# Patient Record
Sex: Male | Born: 1955
Health system: Southern US, Community
[De-identification: ages and names within clinical notes are randomized; demographics above are authoritative.]

## PROBLEM LIST (undated history)

## (undated) DIAGNOSIS — I2510 Atherosclerotic heart disease of native coronary artery without angina pectoris: Secondary | ICD-10-CM

## (undated) DIAGNOSIS — J45909 Unspecified asthma, uncomplicated: Secondary | ICD-10-CM

## (undated) DIAGNOSIS — R011 Cardiac murmur, unspecified: Secondary | ICD-10-CM

## (undated) DIAGNOSIS — E785 Hyperlipidemia, unspecified: Secondary | ICD-10-CM

## (undated) DIAGNOSIS — E291 Testicular hypofunction: Secondary | ICD-10-CM

## (undated) DIAGNOSIS — J189 Pneumonia, unspecified organism: Secondary | ICD-10-CM

## (undated) DIAGNOSIS — C44311 Basal cell carcinoma of skin of nose: Secondary | ICD-10-CM

## (undated) DIAGNOSIS — I10 Essential (primary) hypertension: Secondary | ICD-10-CM

## (undated) DIAGNOSIS — B351 Tinea unguium: Secondary | ICD-10-CM

## (undated) HISTORY — DX: Hyperlipidemia, unspecified: E78.5

## (undated) HISTORY — DX: Testicular hypofunction: E29.1

## (undated) HISTORY — DX: Unspecified asthma, uncomplicated: J45.909

## (undated) HISTORY — DX: Tinea unguium: B35.1

## (undated) HISTORY — DX: Basal cell carcinoma of skin of nose: C44.311

## (undated) HISTORY — DX: Essential (primary) hypertension: I10

---
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
## 2012-08-24 ENCOUNTER — Ambulatory Visit (INDEPENDENT_AMBULATORY_CARE_PROVIDER_SITE_OTHER): Payer: BC Managed Care – PPO | Admitting: Sports Medicine

## 2012-08-24 ENCOUNTER — Encounter: Payer: Self-pay | Admitting: Sports Medicine

## 2012-08-24 VITALS — BP 151/89 | HR 65 | Wt 214.0 lb

## 2012-08-24 DIAGNOSIS — C44319 Basal cell carcinoma of skin of other parts of face: Secondary | ICD-10-CM

## 2012-08-24 DIAGNOSIS — E291 Testicular hypofunction: Secondary | ICD-10-CM

## 2012-08-24 DIAGNOSIS — I10 Essential (primary) hypertension: Secondary | ICD-10-CM

## 2012-08-24 DIAGNOSIS — B351 Tinea unguium: Secondary | ICD-10-CM

## 2012-08-24 DIAGNOSIS — R5381 Other malaise: Secondary | ICD-10-CM

## 2012-08-24 DIAGNOSIS — C44311 Basal cell carcinoma of skin of nose: Secondary | ICD-10-CM | POA: Insufficient documentation

## 2012-08-24 DIAGNOSIS — Z2989 Encounter for other specified prophylactic measures: Secondary | ICD-10-CM

## 2012-08-24 DIAGNOSIS — Z23 Encounter for immunization: Secondary | ICD-10-CM

## 2012-08-24 DIAGNOSIS — R5383 Other fatigue: Secondary | ICD-10-CM

## 2012-08-24 DIAGNOSIS — R0789 Other chest pain: Secondary | ICD-10-CM

## 2012-08-24 DIAGNOSIS — Z298 Encounter for other specified prophylactic measures: Secondary | ICD-10-CM

## 2012-08-24 HISTORY — DX: Essential (primary) hypertension: I10

## 2012-08-24 HISTORY — DX: Tinea unguium: B35.1

## 2012-08-24 HISTORY — DX: Testicular hypofunction: E29.1

## 2012-08-24 HISTORY — DX: Basal cell carcinoma of skin of nose: C44.311

## 2012-08-24 MED ORDER — NITROGLYCERIN 0.4 MG SL SUBL: 0.4000 mg | SUBLINGUAL_TABLET | SUBLINGUAL | Status: DC | PRN

## 2012-08-24 MED ORDER — ASPIRIN EC 81 MG PO TBEC: 81.0000 mg | DELAYED_RELEASE_TABLET | Freq: Every day | ORAL | Status: DC

## 2012-08-24 MED ORDER — TERBINAFINE HCL 250 MG PO TABS: 250.0000 mg | ORAL_TABLET | Freq: Every day | ORAL | Status: AC

## 2012-08-24 MED ORDER — HYDROCHLOROTHIAZIDE 25 MG PO TABS: 12.5000 mg | ORAL_TABLET | Freq: Every day | ORAL | Status: DC

## 2012-08-24 NOTE — Assessment & Plan Note (Addendum)
He is not currently having chest pain now, he does decline referral to cardiology for stress testing at this time. He would like to think about this for a while, and will let me know. I would like him to have some nitroglycerin on hand just in case in the meantime. Aspirin 81 mg. We will also optimize his risk factors. He will come back for fasting blood work.

## 2012-08-24 NOTE — Assessment & Plan Note (Signed)
Has been referred for Mohs microsurgery by his dermatologist.

## 2012-08-24 NOTE — Assessment & Plan Note (Signed)
Adding hydrochlorothiazide. RTC 2 weeks to recheck.

## 2012-08-24 NOTE — Progress Notes (Signed)
Subjective:    CC: Establish care.   HPI:  Elevated blood pressure: Present on several occasions, he's tried dieting, and low sodium, it is not improved.  Atypical chest pain: Present randomly, patient cannot relate it to exertion. It is substernal, and does not radiate. It is not associated with presyncope, nausea, or diaphoresis, and does not get relieved with rest.  Fatigue: Just a generalized sense, would like some blood work checked.  Toenail fungus: Has tried multiple over-the-counter brands for several months, wonders if there's a prescription strength.  Past medical history, Surgical history, Family history, Social history, Allergies, and medications have been entered into the medical record, reviewed, and no changes needed.   Review of Systems: No headache, visual changes, nausea, vomiting, diarrhea, constipation, dizziness, abdominal pain, skin rash, fevers, chills, night sweats, swollen lymph nodes, weight loss, chest pain, body aches, joint swelling, muscle aches, or shortness of breath.   Objective:    General: Well Developed, well nourished, and in no acute distress.  Neuro: Alert and oriented x3, extra-ocular muscles intact.  HEENT: Normocephalic, atraumatic, pupils equal round reactive to light, neck supple, no masses, no lymphadenopathy, thyroid nonpalpable.  Skin: Warm and dry, no rashes noted.  Scar from prior basal cell carcinoma excision noted on nose.  Cardiac: Regular rate and rhythm, no murmurs rubs or gallops.  Respiratory: Clear to auscultation bilaterally. Not using accessory muscles, speaking in full sentences.  Abdominal: Soft, nontender, nondistended, positive bowel sounds, no masses, no organomegaly.  Musculoskeletal: Shoulder, elbow, wrist, hip, knee, ankle stable, and with full range of motion.  Impression and Recommendations:

## 2012-08-24 NOTE — Assessment & Plan Note (Signed)
CBC, CMET, testosterone, lipids, TSH.

## 2012-08-24 NOTE — Patient Instructions (Signed)
Please think about seeing a cardiologist for a stress test.

## 2012-08-24 NOTE — Assessment & Plan Note (Signed)
Lamisil for 12 weeks. 

## 2012-08-28 LAB — COMPREHENSIVE METABOLIC PANEL
ALT: 14 U/L (ref 0–53)
AST: 16 U/L (ref 0–37)
Albumin: 4.1 g/dL (ref 3.5–5.2)
Alkaline Phosphatase: 66 U/L (ref 39–117)
BUN: 11 mg/dL (ref 6–23)
CO2: 27 mEq/L (ref 19–32)
Calcium: 9.4 mg/dL (ref 8.4–10.5)
Chloride: 101 mEq/L (ref 96–112)
Creat: 0.88 mg/dL (ref 0.50–1.35)
Glucose, Bld: 87 mg/dL (ref 70–99)
Potassium: 4 mEq/L (ref 3.5–5.3)
Sodium: 138 mEq/L (ref 135–145)
Total Bilirubin: 0.9 mg/dL (ref 0.3–1.2)
Total Protein: 7.2 g/dL (ref 6.0–8.3)

## 2012-08-28 LAB — LIPID PANEL
Cholesterol: 212 mg/dL — ABNORMAL HIGH (ref 0–200)
HDL: 37 mg/dL — ABNORMAL LOW (ref >39)
LDL Cholesterol: 144 mg/dL — ABNORMAL HIGH (ref 0–99)
Total CHOL/HDL Ratio: 5.7 Ratio
Triglycerides: 155 mg/dL — ABNORMAL HIGH (ref <150)
VLDL: 31 mg/dL (ref 0–40)

## 2012-08-28 LAB — CBC
HCT: 47.4 % (ref 39.0–52.0)
Hemoglobin: 16 g/dL (ref 13.0–17.0)
MCH: 29.3 pg (ref 26.0–34.0)
MCHC: 33.8 g/dL (ref 30.0–36.0)
MCV: 86.8 fL (ref 78.0–100.0)
Platelets: 255 10*3/uL (ref 150–400)
RBC: 5.46 MIL/uL (ref 4.22–5.81)
RDW: 13.6 % (ref 11.5–15.5)
WBC: 7.1 10*3/uL (ref 4.0–10.5)

## 2012-08-28 LAB — TSH: TSH: 1.267 u[IU]/mL (ref 0.350–4.500)

## 2012-08-29 ENCOUNTER — Encounter: Payer: Self-pay | Admitting: Sports Medicine

## 2012-08-29 LAB — TESTOSTERONE, FREE, TOTAL, SHBG
Sex Hormone Binding: 20 nmol/L (ref 13–71)
Testosterone, Free: 54.6 pg/mL (ref 47.0–244.0)
Testosterone-% Free: 2.5 % (ref 1.6–2.9)
Testosterone: 219.85 ng/dL — ABNORMAL LOW (ref 300–890)

## 2012-09-05 ENCOUNTER — Encounter: Payer: Self-pay | Admitting: Sports Medicine

## 2012-09-05 ENCOUNTER — Ambulatory Visit (INDEPENDENT_AMBULATORY_CARE_PROVIDER_SITE_OTHER): Payer: BC Managed Care – PPO | Admitting: Sports Medicine

## 2012-09-05 VITALS — BP 147/84 | HR 75 | Wt 212.0 lb

## 2012-09-05 DIAGNOSIS — R0789 Other chest pain: Secondary | ICD-10-CM

## 2012-09-05 DIAGNOSIS — R5383 Other fatigue: Secondary | ICD-10-CM

## 2012-09-05 DIAGNOSIS — E785 Hyperlipidemia, unspecified: Secondary | ICD-10-CM

## 2012-09-05 DIAGNOSIS — I10 Essential (primary) hypertension: Secondary | ICD-10-CM

## 2012-09-05 DIAGNOSIS — R5381 Other malaise: Secondary | ICD-10-CM

## 2012-09-05 DIAGNOSIS — Z299 Encounter for prophylactic measures, unspecified: Secondary | ICD-10-CM | POA: Insufficient documentation

## 2012-09-05 HISTORY — DX: Hyperlipidemia, unspecified: E78.5

## 2012-09-05 MED ORDER — TESTOSTERONE 40.5 MG/2.5GM (1.62%) TD GEL: 1.0000 "application " | Freq: Every day | TRANSDERMAL | Status: DC

## 2012-09-05 NOTE — Progress Notes (Signed)
Subjective:    CC: Followup  HPI: Fatigue: Testosterone levels came back low, he would like to start supplementation.  Hypertension: Improved on half of a tab. No adverse effects.  Atypical chest pain: He has decided that he would like to pursue stress testing, he's not had to use any nitroglycerin since the prior visit.  Preventive care: Due for a colonoscopy would like to set up.  Past medical history, Surgical history, Family history, Social history, Allergies, and medications have been entered into the medical record, reviewed, and no changes needed.   Review of Systems: No fevers, chills, night sweats, weight loss, chest pain, or shortness of breath.   Objective:    General: Well Developed, well nourished, and in no acute distress.  Neuro: Alert and oriented x3, extra-ocular muscles intact.  HEENT: Normocephalic, atraumatic, pupils equal round reactive to light, neck supple, no masses, no lymphadenopathy, thyroid nonpalpable.  Skin: Warm and dry, no rashes. Cardiac: Regular rate and rhythm, no murmurs rubs or gallops.  Respiratory: Clear to auscultation bilaterally. Not using accessory muscles, speaking in full sentences.  Impression and Recommendations:

## 2012-09-05 NOTE — Assessment & Plan Note (Signed)
The patient did have a chance to think about this, and would like to proceed to stress testing as recommended. I will go ahead and set him up with Dr. Jens Som downstairs.

## 2012-09-05 NOTE — Assessment & Plan Note (Signed)
Patient would like to try diet modification first. We will check his lipids again in 3 months.

## 2012-09-05 NOTE — Assessment & Plan Note (Signed)
Better but not ideal, increase the hydrochlorothiazide on entire tab daily. Followup in 2 weeks for nurse blood pressure check.

## 2012-09-05 NOTE — Assessment & Plan Note (Signed)
Testosterone level of 200, is the likely cause of his fatigue. We will start AndroGel. He will return to see me in 3 months for recheck. Goal is 500.

## 2012-09-05 NOTE — Assessment & Plan Note (Signed)
We'll go ahead and set him up with a gastroenterologist referral for colonoscopy.

## 2012-09-07 ENCOUNTER — Encounter: Payer: Self-pay | Admitting: Gastroenterology

## 2012-09-10 ENCOUNTER — Other Ambulatory Visit: Payer: Self-pay | Admitting: *Deleted

## 2012-09-19 ENCOUNTER — Ambulatory Visit (INDEPENDENT_AMBULATORY_CARE_PROVIDER_SITE_OTHER): Payer: BC Managed Care – PPO | Admitting: Sports Medicine

## 2012-09-19 VITALS — BP 138/82 | HR 70

## 2012-09-19 DIAGNOSIS — I10 Essential (primary) hypertension: Secondary | ICD-10-CM

## 2012-09-19 MED ORDER — LISINOPRIL-HYDROCHLOROTHIAZIDE 20-25 MG PO TABS: ORAL_TABLET | ORAL | Status: DC

## 2012-09-19 NOTE — Assessment & Plan Note (Signed)
I would like to add lisinopril and accommodation as his blood pressures close, not ideal I would like him to followup again in 2 weeks for repeat blood pressure check.

## 2012-09-19 NOTE — Progress Notes (Signed)
  Subjective:    Patient ID: Dominic Ray, male    DOB: 09-Dec-1955, 56 y.o.   MRN: [id number]  HPI    Pt denies chest pain, SOB, dizziness, or heart palpitations.  Taking meds as directed w/o problems.  Denies medication side effects.  5 min spent with pt.  Review of Systems     Objective:   Physical Exam        Assessment & Plan:

## 2012-09-25 ENCOUNTER — Encounter: Payer: Self-pay | Admitting: *Deleted

## 2012-09-25 ENCOUNTER — Encounter: Payer: Self-pay | Admitting: Cardiology

## 2012-09-26 ENCOUNTER — Encounter: Payer: Self-pay | Admitting: Cardiology

## 2012-09-26 ENCOUNTER — Ambulatory Visit (INDEPENDENT_AMBULATORY_CARE_PROVIDER_SITE_OTHER): Payer: BC Managed Care – PPO | Admitting: Cardiology

## 2012-09-26 VITALS — BP 131/89 | HR 68 | Wt 212.0 lb

## 2012-09-26 DIAGNOSIS — E785 Hyperlipidemia, unspecified: Secondary | ICD-10-CM

## 2012-09-26 DIAGNOSIS — I10 Essential (primary) hypertension: Secondary | ICD-10-CM

## 2012-09-26 DIAGNOSIS — R0789 Other chest pain: Secondary | ICD-10-CM

## 2012-09-26 NOTE — Progress Notes (Signed)
  HPI: 56 year old male for evaluation of chest pain. Seen recently by primary care for above complaint and we were asked to evaluate. Laboratories in October of 2013 showed normal hemoglobin, normal renal function and normal LFTs. TSH normal. LDL 144. Patient describes chest tightness intermittently for approximately 15 years. It is in the left chest area and lasts less than 1 minute. It is not pleuritic. He occasionally notices it after eating. It is not exertional. No radiation or associated symptoms. He denies dyspnea on exertion, orthopnea, PND, palpitations, syncope or exertional chest pain.  Current Outpatient Prescriptions  Medication Sig Dispense Refill  . aspirin EC 81 MG tablet Take 1 tablet (81 mg total) by mouth daily.  90 tablet  3  . lisinopril-hydrochlorothiazide (PRINZIDE,ZESTORETIC) 20-25 MG per tablet Take one half tab for a week, then one whole tablet daily.  30 tablet  3  . nitroGLYCERIN (NITROSTAT) 0.4 MG SL tablet Place 1 tablet (0.4 mg total) under the tongue every 5 (five) minutes as needed for chest pain.  10 tablet  12  . terbinafine (LAMISIL) 250 MG tablet Take 1 tablet (250 mg total) by mouth daily.  84 tablet  0  . Testosterone 40.5 MG/2.5GM (1.62%) GEL Apply 1 application topically daily. Apply to shoulders and upper arms.  75 g  0    Allergies  Allergen Reactions  . [name] (Lidocaine)     Past Medical History  Diagnosis Date  . Hypertension 08/24/2012  . Onychomycosis 08/24/2012  . Male hypogonadism 08/24/2012  . Basal cell carcinoma of nasal tip 08/24/2012  . Hyperlipidemia 09/05/2012  . Preventive measure 09/05/2012    Past Surgical History  Procedure Date  . Tonsillectomy     History   Social History  . Marital Status: Married    Spouse Name: N/A    Number of Children: N/A  . Years of Education: N/A   Occupational History  .      Unemployed   Social History Main Topics  . Smoking status: Never Smoker   . Smokeless tobacco: Not on file    . Alcohol Use: Yes     Comment: Occasional  . Drug Use: No  . Sexually Active: Not on file   Other Topics Concern  . Not on file   Social History Narrative  . No narrative on file    Family History  Problem Relation Age of Onset  . CAD Father     Died of MI at age 29    ROS: some hip pain but no fevers or chills, productive cough, hemoptysis, dysphasia, odynophagia, melena, hematochezia, dysuria, hematuria, rash, seizure activity, orthopnea, PND, pedal edema, claudication. Remaining systems are negative.  Physical Exam:   Blood pressure 131/89, pulse 68, weight 212 lb (96.163 kg).  General:  Well developed/well nourished in NAD Skin warm/dry Patient not depressed No peripheral clubbing Back-normal HEENT-normal/normal eyelids Neck supple/normal carotid upstroke bilaterally; no bruits; no JVD; no thyromegaly chest - CTA/ normal expansion CV - RRR/normal S1 and S2; no murmurs, rubs or gallops;  PMI nondisplaced Abdomen -NT/ND, no HSM, no mass, + bowel sounds, no bruit 2+ femoral pulses, no bruits Ext-no edema, chords, 2+ DP Neuro-grossly nonfocal  ECG NSR with no ST changes.

## 2012-09-26 NOTE — Assessment & Plan Note (Signed)
Symptoms are atypical. He does have a family history of coronary disease, hypertension and mild hyperlipidemia. Plan exercise treadmill for risk stratification.

## 2012-09-26 NOTE — Assessment & Plan Note (Signed)
Management per primary care. 

## 2012-09-26 NOTE — Patient Instructions (Addendum)
Your physician has requested that you have an exercise tolerance test. For further information please visit [email or web address]. Please also follow instruction sheet, as given.    Exercise Stress Electrocardiography An exercise stress test is a heart test (EKG) which is done while you are moving. You will walk on a treadmill. This test will tell your doctor how your heart does when it is forced to work harder and how much activity you can safely handle. BEFORE THE TEST  Wear shorts or athletic pants.  Wear comfortable tennis shoes.  Women need to wear a bra that allows patches to be put on under it. TEST  An EKG cable will be attached to your waist. This cable is hooked up to patches, which look like round stickers stuck to your chest.  You will be asked to walk on the treadmill.  You will walk until you are too tired or until you are told to stop.  Tell the doctor right away if you have:  Chest pain.  Leg cramps.  Shortness of breath.  Dizziness.  The test may last 30 minutes to 1 hour. The timing depends on your physical condition and the condition of your heart. AFTER THE TEST  You will rest for about 6 minutes. During this time, your heart rhythm and blood pressure will be checked.  The testing equipment will be removed from your body and you can get dressed.  You may go home or back to your hospital room. You may keep doing all your usual activities as told by your doctor. Finding out the results of your test Ask when your test results will be ready. Make sure you get your test results. Document Released: 04/18/2008 Document Revised: 01/23/2012 Document Reviewed: 04/18/2008 Exit[employer] Patient Information 2013 [employer].  

## 2012-09-26 NOTE — Assessment & Plan Note (Signed)
Blood pressure is reasonable on present medications. We'll continue. Patient instructed on last modification including low sodium diet, weight loss and exercise.

## 2012-10-04 ENCOUNTER — Ambulatory Visit (INDEPENDENT_AMBULATORY_CARE_PROVIDER_SITE_OTHER): Payer: BC Managed Care – PPO | Admitting: Sports Medicine

## 2012-10-04 VITALS — BP 115/73 | HR 81 | Wt 211.0 lb

## 2012-10-04 DIAGNOSIS — M25512 Pain in left shoulder: Secondary | ICD-10-CM

## 2012-10-04 DIAGNOSIS — M7550 Bursitis of unspecified shoulder: Secondary | ICD-10-CM | POA: Insufficient documentation

## 2012-10-04 DIAGNOSIS — R0789 Other chest pain: Secondary | ICD-10-CM

## 2012-10-04 DIAGNOSIS — I10 Essential (primary) hypertension: Secondary | ICD-10-CM

## 2012-10-04 DIAGNOSIS — M25519 Pain in unspecified shoulder: Secondary | ICD-10-CM

## 2012-10-04 NOTE — Progress Notes (Deleted)
  Subjective:    Patient ID: Dominic Ray, male    DOB: 11/20/1955, 56 y.o.   MRN: [id number]  HPI    Review of Systems     Objective:   Physical Exam        Assessment & Plan:  B/p CHECK TODAY  115/73  WT 211 P-81

## 2012-10-04 NOTE — Progress Notes (Signed)
Subjective:     Patient ID: Dominic Ray, male   DOB: 1956-08-07, 56 y.o.   MRN: [id number]  HPI Patient is a 56 yo male with a PMH of HTN who presents today for a blood pressure check after adding lisinopril to his medication regimen two weeks prior.   Patients blood pressure was 115/73 today and he has no complaints regarding his blood pressure. Denies dizziness, CP, syncope or new changes in urination.   Patient did complain of shoulder pain with abduction above the shoulder. This has been occuring for sometime and the patient believes it is secondary to how he likes to lay down while watching TV. He localizes the pain over the left trapezius, denies mechanical symptoms, the pain is mild.  Review of Systems  All other systems reviewed and are negative.      Objective:   Physical Exam  Constitutional: He is oriented to person, place, and time. He appears well-developed and well-nourished. No distress.  HENT:  Head: Normocephalic and atraumatic.  Eyes: Conjunctivae normal are normal.  Cardiovascular: Normal rate, regular rhythm and normal heart sounds.   Pulmonary/Chest: Effort normal and breath sounds normal.  Musculoskeletal: Normal range of motion. He exhibits no edema and no tenderness.       4/5 strength in left arm empty can test with slight pain.   Neurological: He is alert and oriented to person, place, and time. He has normal reflexes.  Skin: Skin is warm and dry. He is not diaphoretic.   left Shoulder: Inspection reveals no abnormalities, atrophy or asymmetry. Palpation is normal with no tenderness over AC joint or bicipital groove. ROM is full in all planes. Rotator cuff strength weak to abduction. Positive Hawkins, and Empty Can signs. Negative Neer test. Speeds and Yergason's tests normal. No labral pathology noted with negative Obrien's, negative clunk and good stability. Normal scapular function observed. No painful arc and no drop arm sign. No apprehension sign.    Assessment/Plan:

## 2012-10-04 NOTE — Assessment & Plan Note (Signed)
Stress test scheduled for later this month.

## 2012-10-04 NOTE — Assessment & Plan Note (Signed)
String the mild rotator cuff dysfunction. Occasional ibuprofen, and home rehabilitation exercises. We will revisit this in January.

## 2012-10-04 NOTE — Assessment & Plan Note (Signed)
Extremely well controlled on lisinopril/hydrochlorothiazide. No changes. 

## 2012-10-09 ENCOUNTER — Ambulatory Visit (INDEPENDENT_AMBULATORY_CARE_PROVIDER_SITE_OTHER): Payer: BC Managed Care – PPO | Admitting: Physician Assistant

## 2012-10-09 DIAGNOSIS — R0789 Other chest pain: Secondary | ICD-10-CM

## 2012-10-09 NOTE — Procedures (Signed)
Exercise Treadmill Test  Pre-Exercise Testing Evaluation Rhythm: normal sinus  Rate: 75   PR:  .15 QRS:  .09  QT:  .36 QTc: .40     Test  Exercise Tolerance Test Ordering MD: Olga Millers, MD  Interpreting MD: Lilian Coma  Unique Test No: 1  Treadmill:  1  Indication for ETT: CHEST TIGHTNESS  Contraindication to ETT: No   Stress Modality: exercise - treadmill  Cardiac Imaging Performed: non   Protocol: standard Bruce - maximal  Max BP:  160/67  Max MPHR (bpm):  164 85% MPR (bpm):  139  MPHR obtained (bpm):  164 % MPHR obtained:  100%  Reached 85% MPHR (min:sec):  8:15 Total Exercise Time (min-sec):  10:57  Workload in METS:  13.3 Borg Scale: 17  Reason ETT Terminated:  desired heart rate attained    ST Segment Analysis At Rest: normal ST segments - no evidence of significant ST depression With Exercise: no evidence of significant ST depression  Other Information Arrhythmia:  occasional PVCs in recovery Angina during ETT:  absent (0) Quality of ETT:  diagnostic  ETT Interpretation:  normal - no evidence of ischemia by ST analysis  Comments: Good exercise tolerance. No chest pain. Normal BP response to exercise. No ST-T changes to suggest ischemia.  Occasional PVCs in recovery.  Recommendations: Follow up with Dr. Olga Millers as directed. Luna Glasgow, PA-C  10:27 AM 10/09/2012

## 2012-10-10 ENCOUNTER — Ambulatory Visit (AMBULATORY_SURGERY_CENTER): Payer: BC Managed Care – PPO | Admitting: *Deleted

## 2012-10-10 ENCOUNTER — Encounter: Payer: Self-pay | Admitting: Gastroenterology

## 2012-10-10 VITALS — Ht 68.0 in | Wt 214.8 lb

## 2012-10-10 DIAGNOSIS — Z1211 Encounter for screening for malignant neoplasm of colon: Secondary | ICD-10-CM

## 2012-10-10 MED ORDER — MOVIPREP 100 G PO SOLR: ORAL | Status: DC

## 2012-10-15 ENCOUNTER — Ambulatory Visit (AMBULATORY_SURGERY_CENTER): Payer: BC Managed Care – PPO | Admitting: Gastroenterology

## 2012-10-15 ENCOUNTER — Encounter: Payer: Self-pay | Admitting: Gastroenterology

## 2012-10-15 VITALS — BP 96/70 | HR 73 | Temp 98.0°F | Resp 17 | Ht 68.0 in | Wt 214.0 lb

## 2012-10-15 DIAGNOSIS — Z1211 Encounter for screening for malignant neoplasm of colon: Secondary | ICD-10-CM

## 2012-10-15 MED ORDER — SODIUM CHLORIDE 0.9 % IV SOLN: 500.0000 mL | INTRAVENOUS | Status: DC

## 2012-10-15 NOTE — Op Note (Signed)
[facility] [address] [place], [address]   COLONOSCOPY PROCEDURE REPORT  PATIENT: Dominic Ray, Dominic Ray  MR#: [id number] BIRTHDATE: 1956-09-10 , 56  yrs. old GENDER: Male ENDOSCOPIST: Mardella Layman, MD, [facility] REFERRED BY: PROCEDURE DATE:  10/15/2012 PROCEDURE:   Colonoscopy, screening ASA CLASS:   Class II INDICATIONS:Average risk patient for colon cancer. MEDICATIONS: propofol (Diprivan) 250mg  IV  DESCRIPTION OF PROCEDURE:   After the risks and benefits and of the procedure were explained, informed consent was obtained.  A digital rectal exam revealed no abnormalities of the rectum.    The LB [id number]  endoscope was introduced through the anus and advanced to the cecum, which was identified by both the appendix and ileocecal valve .  The quality of the prep was adequate, using MoviPrep .  The instrument was then slowly withdrawn as the colon was fully examined.     COLON FINDINGS: A normal appearing cecum, ileocecal valve, and appendiceal orifice were identified.  The ascending, hepatic flexure, transverse, splenic flexure, descending, sigmoid colon and rectum appeared unremarkable.  No polyps or cancers were seen. Retroflexed views revealed no abnormalities.     The scope was then withdrawn from the patient and the procedure completed.  COMPLICATIONS: There were no complications. ENDOSCOPIC IMPRESSION: Normal colon, no polyps or cancer.  RECOMMENDATIONS: Continue current colorectal screening recommendations for "routine risk" patients with a repeat colonoscopy in 10 years.   REPEAT EXAM:  cc:Dr. Rodney Langton  _______________________________ eSigned:  Mardella Layman, MD, [facility] 10/15/2012 11:04 AM

## 2012-10-15 NOTE — Patient Instructions (Addendum)
Normal colonoscopy.    Repeat colonoscopy in 10 years.  YOU HAD AN ENDOSCOPIC PROCEDURE TODAY AT THE [facility]: Refer to the procedure report that was given to you for any specific questions about what was found during the examination.  If the procedure report does not answer your questions, please call your gastroenterologist to clarify.  If you requested that your care partner not be given the details of your procedure findings, then the procedure report has been included in a sealed envelope for you to review at your convenience later.  YOU SHOULD EXPECT: Some feelings of bloating in the abdomen. Passage of more gas than usual.  Walking can help get rid of the air that was put into your GI tract during the procedure and reduce the bloating. If you had a lower endoscopy (such as a colonoscopy or flexible sigmoidoscopy) you may notice spotting of blood in your stool or on the toilet paper. If you underwent a bowel prep for your procedure, then you may not have a normal bowel movement for a few days.  DIET: Your first meal following the procedure should be a light meal and then it is ok to progress to your normal diet.  A half-sandwich or bowl of soup is an example of a good first meal.  Heavy or fried foods are harder to digest and may make you feel nauseous or bloated.  Likewise meals heavy in dairy and vegetables can cause extra gas to form and this can also increase the bloating.  Drink plenty of fluids but you should avoid alcoholic beverages for 24 hours.  ACTIVITY: Your care partner should take you home directly after the procedure.  You should plan to take it easy, moving slowly for the rest of the day.  You can resume normal activity the day after the procedure however you should NOT DRIVE or use heavy machinery for 24 hours (because of the sedation medicines used during the test).    SYMPTOMS TO REPORT IMMEDIATELY: A gastroenterologist can be reached at any hour.  During normal  business hours, 8:30 AM to 5:00 PM Monday through Friday, call [phone].  After hours and on weekends, please call the GI answering service at [phone] who will take a message and have the physician on call contact you.   Following lower endoscopy (colonoscopy or flexible sigmoidoscopy):  Excessive amounts of blood in the stool  Significant tenderness or worsening of abdominal pains  Swelling of the abdomen that is new, acute  Fever of 100F or higher   FOLLOW UP: If any biopsies were taken you will be contacted by phone or by letter within the next 1-3 weeks.  Call your gastroenterologist if you have not heard about the biopsies in 3 weeks.  Our staff will call the home number listed on your records the next business day following your procedure to check on you and address any questions or concerns that you may have at that time regarding the information given to you following your procedure. This is a courtesy call and so if there is no answer at the home number and we have not heard from you through the emergency physician on call, we will assume that you have returned to your regular daily activities without incident.  SIGNATURES/CONFIDENTIALITY: You and/or your care partner have signed paperwork which will be entered into your electronic medical record.  These signatures attest to the fact that that the information above on your After Visit Summary has been   reviewed and is understood.  Full responsibility of the confidentiality of this discharge information lies with you and/or your care-partner. 

## 2012-10-16 ENCOUNTER — Telehealth: Payer: Self-pay | Admitting: *Deleted

## 2012-10-16 NOTE — Telephone Encounter (Signed)
  Follow up Call-  Call back number 10/15/2012  Post procedure Call Back phone  # [phone]  Permission to leave phone message Yes     Patient questions:  Do you have a fever, pain , or abdominal swelling? no Pain Score  0 *  Have you tolerated food without any problems? yes  Have you been able to return to your normal activities? yes  Do you have any questions about your discharge instructions: Diet   no Medications  no Follow up visit  no  Do you have questions or concerns about your Care? no  Actions: * If pain score is 4 or above: No action needed, pain <4.  Pt. Stated he had a little cough, do I think he may be getting a cold from this. Stated to pt. That the procedure would not give cold symptoms and if he started having a temperature he should follow up with pcp.

## 2012-10-17 ENCOUNTER — Other Ambulatory Visit: Payer: Self-pay | Admitting: Sports Medicine

## 2012-12-06 ENCOUNTER — Telehealth: Payer: Self-pay | Admitting: *Deleted

## 2012-12-06 ENCOUNTER — Encounter: Payer: Self-pay | Admitting: Sports Medicine

## 2012-12-06 ENCOUNTER — Ambulatory Visit: Payer: BC Managed Care – PPO | Admitting: Family Medicine

## 2012-12-06 ENCOUNTER — Ambulatory Visit (INDEPENDENT_AMBULATORY_CARE_PROVIDER_SITE_OTHER): Payer: BC Managed Care – PPO | Admitting: Sports Medicine

## 2012-12-06 VITALS — BP 115/80 | HR 84 | Wt 214.0 lb

## 2012-12-06 DIAGNOSIS — M25519 Pain in unspecified shoulder: Secondary | ICD-10-CM

## 2012-12-06 DIAGNOSIS — E785 Hyperlipidemia, unspecified: Secondary | ICD-10-CM

## 2012-12-06 DIAGNOSIS — C44311 Basal cell carcinoma of skin of nose: Secondary | ICD-10-CM

## 2012-12-06 DIAGNOSIS — M25512 Pain in left shoulder: Secondary | ICD-10-CM

## 2012-12-06 DIAGNOSIS — I10 Essential (primary) hypertension: Secondary | ICD-10-CM

## 2012-12-06 DIAGNOSIS — E291 Testicular hypofunction: Secondary | ICD-10-CM

## 2012-12-06 DIAGNOSIS — R0789 Other chest pain: Secondary | ICD-10-CM

## 2012-12-06 DIAGNOSIS — B351 Tinea unguium: Secondary | ICD-10-CM

## 2012-12-06 DIAGNOSIS — Z299 Encounter for prophylactic measures, unspecified: Secondary | ICD-10-CM

## 2012-12-06 LAB — COMPREHENSIVE METABOLIC PANEL
ALT: 15 U/L (ref 0–53)
AST: 18 U/L (ref 0–37)
Albumin: 4.3 g/dL (ref 3.5–5.2)
Alkaline Phosphatase: 62 U/L (ref 39–117)
BUN: 16 mg/dL (ref 6–23)
CO2: 29 mEq/L (ref 19–32)
Calcium: 9.1 mg/dL (ref 8.4–10.5)
Chloride: 98 mEq/L (ref 96–112)
Creat: 1.07 mg/dL (ref 0.50–1.35)
Glucose, Bld: 96 mg/dL (ref 70–99)
Potassium: 3.8 mEq/L (ref 3.5–5.3)
Sodium: 137 mEq/L (ref 135–145)
Total Bilirubin: 0.7 mg/dL (ref 0.3–1.2)
Total Protein: 7.3 g/dL (ref 6.0–8.3)

## 2012-12-06 LAB — LIPID PANEL
Cholesterol: 218 mg/dL — ABNORMAL HIGH (ref 0–200)
HDL: 40 mg/dL (ref >39)
LDL Cholesterol: 142 mg/dL — ABNORMAL HIGH (ref 0–99)
Total CHOL/HDL Ratio: 5.5 Ratio
Triglycerides: 179 mg/dL — ABNORMAL HIGH (ref <150)
VLDL: 36 mg/dL (ref 0–40)

## 2012-12-06 MED ORDER — LISINOPRIL-HYDROCHLOROTHIAZIDE 20-25 MG PO TABS: 1.0000 | ORAL_TABLET | Freq: Every day | ORAL | Status: DC

## 2012-12-06 MED ORDER — ASPIRIN EC 81 MG PO TBEC: 81.0000 mg | DELAYED_RELEASE_TABLET | Freq: Every day | ORAL | Status: DC

## 2012-12-06 NOTE — Assessment & Plan Note (Signed)
He has been working on dietary modification. We will recheck today.

## 2012-12-06 NOTE — Assessment & Plan Note (Signed)
UTD on colonoscopy October 14, 2012.

## 2012-12-06 NOTE — Telephone Encounter (Signed)
3 times a day if able, holla!

## 2012-12-06 NOTE — Progress Notes (Signed)
Subjective:    CC: Followup  HPI: Hypertension: Controlled, no problems with medication.  Hyperlipidemia: LDL was in the 140s, currently doing low-cholesterol diet.  Left shoulder pain: Rotator cuff dysfunction, has been inconsistent with exercise. Still with pain.  Hypogonadism: unfortunately is having a $400 per month for his AndroGel. He does note some depressed mood, and is unsure if this is because of stresses in life and difficulty finding a job versus low testosterone.  Atypical chest pain: Exercise treadmill test was negative. Has not had to use any nitroglycerin.  Past medical history, Surgical history, Family history not pertinant except as noted below, Social history, Allergies, and medications have been entered into the medical record, reviewed, and no changes needed.   Review of Systems: No fevers, chills, night sweats, weight loss, chest pain, or shortness of breath.   Objective:    General: Well Developed, well nourished, and in no acute distress.  Neuro: Alert and oriented x3, extra-ocular muscles intact, sensation grossly intact.  HEENT: Normocephalic, atraumatic, pupils equal round reactive to light, neck supple, no masses, no lymphadenopathy, thyroid nonpalpable.  Skin: Warm and dry, no rashes. Cardiac: Regular rate and rhythm, no murmurs rubs or gallops.  Respiratory: Clear to auscultation bilaterally. Not using accessory muscles, speaking in full sentences.  Impression and Recommendations:

## 2012-12-06 NOTE — Assessment & Plan Note (Signed)
Still hurting but inconsistent with rehab exercises. Will work harder on this and revisit in 6 weeks. Injection if no better.

## 2012-12-06 NOTE — Assessment & Plan Note (Signed)
Status post Mohs, has some skin irritation likely dermatitis related to sutures. Asked him to speak to his dermatologist regarding this.

## 2012-12-06 NOTE — Telephone Encounter (Signed)
Pt.notified

## 2012-12-06 NOTE — Assessment & Plan Note (Signed)
Inconsistent with testosterone. Rechecking. He is paying $400 a month for this and will call his insurance to see if there is a cheaper option.

## 2012-12-06 NOTE — Assessment & Plan Note (Signed)
Excellent control. Refilling medication.

## 2012-12-06 NOTE — Telephone Encounter (Signed)
Pt calls and was seen this morning and given shoulder exercises and wants to know how many times a day he needs to do these

## 2012-12-06 NOTE — Assessment & Plan Note (Signed)
ETT negative. May discontinue nitroglycerin.

## 2012-12-06 NOTE — Assessment & Plan Note (Signed)
Improving, s/p 3 months of terbinafine treatment.

## 2012-12-07 LAB — TESTOSTERONE, FREE, TOTAL, SHBG
Sex Hormone Binding: 22 nmol/L (ref 13–71)
Testosterone, Free: 59.5 pg/mL (ref 47.0–244.0)
Testosterone-% Free: 2.4 % (ref 1.6–2.9)
Testosterone: 248.03 ng/dL — ABNORMAL LOW (ref 300–890)

## 2013-01-17 ENCOUNTER — Ambulatory Visit: Payer: BC Managed Care – PPO | Admitting: Sports Medicine

## 2013-01-28 ENCOUNTER — Ambulatory Visit: Payer: BC Managed Care – PPO | Admitting: Sports Medicine

## 2013-02-07 ENCOUNTER — Ambulatory Visit: Payer: BC Managed Care – PPO | Admitting: Sports Medicine

## 2013-02-13 ENCOUNTER — Ambulatory Visit (INDEPENDENT_AMBULATORY_CARE_PROVIDER_SITE_OTHER): Payer: BC Managed Care – PPO | Admitting: Sports Medicine

## 2013-02-13 ENCOUNTER — Encounter: Payer: Self-pay | Admitting: Sports Medicine

## 2013-02-13 VITALS — BP 124/78 | HR 88 | Wt 216.0 lb

## 2013-02-13 DIAGNOSIS — M25512 Pain in left shoulder: Secondary | ICD-10-CM

## 2013-02-13 DIAGNOSIS — M25519 Pain in unspecified shoulder: Secondary | ICD-10-CM

## 2013-02-13 NOTE — Assessment & Plan Note (Signed)
Likely related to subacromial bursitis. Has improved only slightly with home exercises. Scan of the rotator cuff today and it was intact, he does have a thickened bursa suggestive of subacromial bursitis. He will think about injection. He'll let us know what he decides.

## 2013-02-13 NOTE — Progress Notes (Signed)
  Subjective:    CC: Right shoulder pain followup  HPI: Right shoulder: I diagnosed Dominic Ray with rotator cuff dysfunction at the last visit. He's been doing his rehabilitation exercises and notes only a minimal benefit. He still has pain he localizes over the deltoid, worse with flexion, internal rotation of the shoulder. Pain is moderate, doesn't radiate.  Past medical history, Surgical history, Family history not pertinant except as noted below, Social history, Allergies, and medications have been entered into the medical record, reviewed, and no changes needed.   Review of Systems: No fevers, chills, night sweats, weight loss, chest pain, or shortness of breath.   Objective:    General: Well Developed, well nourished, and in no acute distress.  Neuro: Alert and oriented x3, extra-ocular muscles intact, sensation grossly intact.  HEENT: Normocephalic, atraumatic, pupils equal round reactive to light, neck supple, no masses, no lymphadenopathy, thyroid nonpalpable.  Skin: Warm and dry, no rashes. Cardiac: Regular rate and rhythm, no murmurs rubs or gallops.  Respiratory: Clear to auscultation bilaterally. Not using accessory muscles, speaking in full sentences. Right Shoulder: Inspection reveals no abnormalities, atrophy or asymmetry. Palpation is normal with no tenderness over AC joint or bicipital groove. ROM is full in all planes. Rotator cuff strength weak abduction and external rotation Positive Neer and Hawkin's tests, empty can sign. Speeds and Yergason's tests normal. No labral pathology noted with negative Obrien's, negative clunk and good stability. Normal scapular function observed. No painful arc and no drop arm sign. No apprehension sign  Procedure: Limited ultrasound of right shoulder Device: GE logiq E. Findings: Supraspinatus, infraspinatus, biceps tendon, teres minor, subscapularis were all imaged and long and short axes, they were normal without signs of tearing. The  subacromial bursa did look enlarged. Impression: Subacromial subdeltoid bursitis.  Images permanently stored in the unit and are available for review. Impression and Recommendations:

## 2013-04-17 ENCOUNTER — Ambulatory Visit (INDEPENDENT_AMBULATORY_CARE_PROVIDER_SITE_OTHER): Payer: BC Managed Care – PPO | Admitting: Sports Medicine

## 2013-04-17 VITALS — BP 114/63 | HR 89

## 2013-04-17 DIAGNOSIS — M7550 Bursitis of unspecified shoulder: Secondary | ICD-10-CM

## 2013-04-17 DIAGNOSIS — IMO0002 Reserved for concepts with insufficient information to code with codable children: Secondary | ICD-10-CM

## 2013-04-17 DIAGNOSIS — M5136 Other intervertebral disc degeneration, lumbar region with discogenic back pain only: Secondary | ICD-10-CM | POA: Insufficient documentation

## 2013-04-17 DIAGNOSIS — M545 Low back pain, unspecified: Secondary | ICD-10-CM

## 2013-04-17 DIAGNOSIS — M75100 Unspecified rotator cuff tear or rupture of unspecified shoulder, not specified as traumatic: Secondary | ICD-10-CM

## 2013-04-17 NOTE — Assessment & Plan Note (Signed)
Dominic Ray's left shoulder pain resolved with home exercises and NSAIDs. Unfortunately pain has continued in the right side. Subacromial bursa injected on the right side under guidance today. Continue exercises.

## 2013-04-17 NOTE — Assessment & Plan Note (Signed)
Symptoms are classically discogenic. Going to have him do some home exercises, and possibly physical therapy further down the road. On further questioning, he does occasionally get right-sided radicular symptoms. See him back in a month or so for this.

## 2013-04-17 NOTE — Progress Notes (Signed)
  Subjective:    CC: Right shoulder  HPI: This pleasant 65 room male has bilateral shoulder pain, we put him through conservative measures including physical therapy, NSAIDs for his left and right shoulders, left shoulder pain has resolved. Previous ultrasound scans have showed intact rotator cuff with the subacromial subdeltoid bursitis. Currently he comes in with persistent pain in his right shoulder the localizes over the deltoid, worse with overhead activities, moderate, stable.  Low back pain: Worse with sitting, flexion, long car rides, localized in the right lower lumbar spine, occasionally radiates down the right leg. Worse with Valsalva. No trauma. Symptoms are moderate, persistent.  Past medical history, Surgical history, Family history not pertinant except as noted below, Social history, Allergies, and medications have been entered into the medical record, reviewed, and no changes needed.   Review of Systems: No fevers, chills, night sweats, weight loss, chest pain, or shortness of breath.   Objective:    General: Well Developed, well nourished, and in no acute distress.  Neuro: Alert and oriented x3, extra-ocular muscles intact, sensation grossly intact.  HEENT: Normocephalic, atraumatic, pupils equal round reactive to light, neck supple, no masses, no lymphadenopathy, thyroid nonpalpable.  Skin: Warm and dry, no rashes. Cardiac: Regular rate and rhythm, no murmurs rubs or gallops, no lower extremity edema.  Respiratory: Clear to auscultation bilaterally. Not using accessory muscles, speaking in full sentences. Right Shoulder: Inspection reveals no abnormalities, atrophy or asymmetry. Palpation is normal with no tenderness over AC joint or bicipital groove. ROM is full in all planes. Rotator cuff strength normal throughout. Positive Neer and Hawkin's tests, empty can sign. Speeds and Yergason's tests normal. No labral pathology noted with negative Obrien's, negative clunk and  good stability. Normal scapular function observed. No painful arc and no drop arm sign. No apprehension sign  Procedure: Real-time Ultrasound Guided Injection of right subacromial bursa Device: GE Logiq E  Ultrasound guided injection is preferred based studies that show increased duration, increased effect, greater accuracy, decreased procedural pain, increased response rate, and decreased cost with ultrasound guided versus blind injection.  Verbal informed consent obtained.  Time-out conducted.  Noted no overlying erythema, induration, or other signs of local infection.  Skin prepped in a sterile fashion.  Local anesthesia: Topical Ethyl chloride.  With sterile technique and under real time ultrasound guidance:  The 25-gauge needle 1 cc Kenalog 40, 4 cc lidocaine injected easily into the subacromial bursa. Completed without difficulty  Pain immediately resolved suggesting accurate placement of the medication.  Advised to call if fevers/chills, erythema, induration, drainage, or persistent bleeding.  Images permanently stored and available for review in the ultrasound unit.  Impression: Technically successful ultrasound guided injection. Impression and Recommendations:

## 2013-05-15 ENCOUNTER — Ambulatory Visit: Payer: BC Managed Care – PPO | Admitting: Sports Medicine

## 2013-05-23 ENCOUNTER — Ambulatory Visit (INDEPENDENT_AMBULATORY_CARE_PROVIDER_SITE_OTHER): Payer: BC Managed Care – PPO | Admitting: Sports Medicine

## 2013-05-23 ENCOUNTER — Encounter: Payer: Self-pay | Admitting: Sports Medicine

## 2013-05-23 VITALS — BP 103/65 | HR 72 | Wt 208.0 lb

## 2013-05-23 DIAGNOSIS — M75100 Unspecified rotator cuff tear or rupture of unspecified shoulder, not specified as traumatic: Secondary | ICD-10-CM

## 2013-05-23 DIAGNOSIS — M545 Low back pain, unspecified: Secondary | ICD-10-CM

## 2013-05-23 DIAGNOSIS — IMO0002 Reserved for concepts with insufficient information to code with codable children: Secondary | ICD-10-CM

## 2013-05-23 DIAGNOSIS — M5136 Other intervertebral disc degeneration, lumbar region with discogenic back pain only: Secondary | ICD-10-CM

## 2013-05-23 DIAGNOSIS — I10 Essential (primary) hypertension: Secondary | ICD-10-CM

## 2013-05-23 DIAGNOSIS — M7550 Bursitis of unspecified shoulder: Secondary | ICD-10-CM

## 2013-05-23 MED ORDER — LISINOPRIL-HYDROCHLOROTHIAZIDE 20-25 MG PO TABS: 1.0000 | ORAL_TABLET | Freq: Every day | ORAL | Status: DC

## 2013-05-23 NOTE — Progress Notes (Signed)
  Subjective:    CC: Followup  HPI: Rotator cuff dysfunction: The left side improved with conservative measures, I injected the subacromial bursa under guidance of the right side a month ago, currently almost pain-free. He is getting back into the gym, and wants to increase his intensity with overhead activities.  Low back pain: Improving with conservative measures, it is localized, doesn't radiate, mild.  Hypertension: Well controlled, needs refill of blood pressure medicines.  Past medical history, Surgical history, Family history not pertinant except as noted below, Social history, Allergies, and medications have been entered into the medical record, reviewed, and no changes needed.   Review of Systems: No fevers, chills, night sweats, weight loss, chest pain, or shortness of breath.   Objective:    General: Well Developed, well nourished, and in no acute distress.  Neuro: Alert and oriented x3, extra-ocular muscles intact, sensation grossly intact.  HEENT: Normocephalic, atraumatic, pupils equal round reactive to light, neck supple, no masses, no lymphadenopathy, thyroid nonpalpable.  Skin: Warm and dry, no rashes. Cardiac: Regular rate and rhythm, no murmurs rubs or gallops, no lower extremity edema.  Respiratory: Clear to auscultation bilaterally. Not using accessory muscles, speaking in full sentences. Impression and Recommendations:

## 2013-05-23 NOTE — Assessment & Plan Note (Signed)
Blood pressures very well controlled. Refilling lisinopril/hydrochlorothiazide.

## 2013-05-23 NOTE — Assessment & Plan Note (Signed)
Continues to be resolved at a right-sided subacromial injection. He will work hard on exercises on. I have asked him to avoid military press and shoulder press in the gym.

## 2013-05-23 NOTE — Assessment & Plan Note (Signed)
Continues to improve with home exercises. We will avoid aggressive intervention and imaging for now as long as he continues to improve.

## 2013-09-11 ENCOUNTER — Encounter: Payer: Self-pay | Admitting: Sports Medicine

## 2013-09-11 ENCOUNTER — Ambulatory Visit (INDEPENDENT_AMBULATORY_CARE_PROVIDER_SITE_OTHER): Payer: BC Managed Care – PPO

## 2013-09-11 ENCOUNTER — Ambulatory Visit (INDEPENDENT_AMBULATORY_CARE_PROVIDER_SITE_OTHER): Payer: BC Managed Care – PPO | Admitting: Sports Medicine

## 2013-09-11 VITALS — BP 121/71 | HR 57 | Wt 209.0 lb

## 2013-09-11 DIAGNOSIS — M5136 Other intervertebral disc degeneration, lumbar region with discogenic back pain only: Secondary | ICD-10-CM

## 2013-09-11 DIAGNOSIS — M5137 Other intervertebral disc degeneration, lumbosacral region: Secondary | ICD-10-CM

## 2013-09-11 DIAGNOSIS — M545 Low back pain, unspecified: Secondary | ICD-10-CM

## 2013-09-11 DIAGNOSIS — E291 Testicular hypofunction: Secondary | ICD-10-CM

## 2013-09-11 DIAGNOSIS — R9389 Abnormal findings on diagnostic imaging of other specified body structures: Secondary | ICD-10-CM

## 2013-09-11 MED ORDER — PREDNISONE 50 MG PO TABS: ORAL_TABLET | ORAL | Status: DC

## 2013-09-11 NOTE — Progress Notes (Signed)
  Subjective:    CC: Follow up  HPI: Right lumbar radiculitis: L5 distribution, improved significantly with home exercises, still desires to continue with conservative measures.  Male hypogonadism: AndroGel was too expensive, I did recommend that we try testosterone injections, he still would like to research this further. Primary symptom is fatigue, and difficulty gaining weight and muscle mass.  Past medical history, Surgical history, Family history not pertinant except as noted below, Social history, Allergies, and medications have been entered into the medical record, reviewed, and no changes needed.   Review of Systems: No fevers, chills, night sweats, weight loss, chest pain, or shortness of breath.   Objective:    General: Well Developed, well nourished, and in no acute distress.  Neuro: Alert and oriented x3, extra-ocular muscles intact, sensation grossly intact.  HEENT: Normocephalic, atraumatic, pupils equal round reactive to light, neck supple, no masses, no lymphadenopathy, thyroid nonpalpable.  Skin: Warm and dry, no rashes. Cardiac: Regular rate and rhythm, no murmurs rubs or gallops, no lower extremity edema.  Respiratory: Clear to auscultation bilaterally. Not using accessory muscles, speaking in full sentences.  I spent 25 minutes with this patient, greater than 50% was face-to-face time counseling regarding discogenic low back pain/radiculitis, and male hypogonadism.  Impression and Recommendations:

## 2013-09-11 NOTE — Assessment & Plan Note (Signed)
Right L5 radicular symptoms. Patient would like to continue conservative measures. I'm going to add prednisone, and x-rays. He can come back to see me on an as-needed basis, if he does desire to get more aggressive this would mean either formal physical therapy if he is agreeable versus MRI for interventional injection planning.

## 2013-09-11 NOTE — Assessment & Plan Note (Signed)
We had a long discussion about testosterone supplementation including the physiology associated with male hypogonadism. Topical testosterone was too expensive. We certainly can do injectable Depo-Testosterone. He is going to do some research on this which I think is appropriate, and he will let us know if he would like to start. Starting dose would be testosterone 200 mg intramuscular Q2 weeks.

## 2013-10-09 ENCOUNTER — Ambulatory Visit (INDEPENDENT_AMBULATORY_CARE_PROVIDER_SITE_OTHER): Payer: BC Managed Care – PPO | Admitting: Sports Medicine

## 2013-10-09 ENCOUNTER — Encounter: Payer: Self-pay | Admitting: Sports Medicine

## 2013-10-09 VITALS — BP 114/72 | HR 78 | Wt 207.0 lb

## 2013-10-09 DIAGNOSIS — IMO0002 Reserved for concepts with insufficient information to code with codable children: Secondary | ICD-10-CM

## 2013-10-09 DIAGNOSIS — M545 Low back pain, unspecified: Secondary | ICD-10-CM

## 2013-10-09 DIAGNOSIS — E291 Testicular hypofunction: Secondary | ICD-10-CM

## 2013-10-09 DIAGNOSIS — M7551 Bursitis of right shoulder: Secondary | ICD-10-CM

## 2013-10-09 DIAGNOSIS — M75100 Unspecified rotator cuff tear or rupture of unspecified shoulder, not specified as traumatic: Secondary | ICD-10-CM

## 2013-10-09 DIAGNOSIS — I10 Essential (primary) hypertension: Secondary | ICD-10-CM

## 2013-10-09 DIAGNOSIS — M5136 Other intervertebral disc degeneration, lumbar region with discogenic back pain only: Secondary | ICD-10-CM

## 2013-10-09 MED ORDER — PREDNISONE (PAK) 10 MG PO TABS: ORAL_TABLET | ORAL | Status: DC

## 2013-10-09 NOTE — Assessment & Plan Note (Signed)
Well controlled, no changes 

## 2013-10-09 NOTE — Progress Notes (Signed)
  Subjective:    CC: Followup  HPI: Right lumbar radiculitis: Valor has L4 on L5 spondylolisthesis with degenerative disc disease, he is actually doing very well with conservative measures including aquatic therapy, and home rehabilitation exercises. He responded very well to prednisone, and is wondering if we can try an additional course. Symptoms are mild, improving. He does not desire yet pursued advanced or interventional treatment.  Hypertension: Well controlled.  Hyperlipidemia: Stable.  Male hypogonadism:  AndroGel was too expensive, he does not get desire to proceed with parenteral testosterone supplementation.  Past medical history, Surgical history, Family history not pertinant except as noted below, Social history, Allergies, and medications have been entered into the medical record, reviewed, and no changes needed.   Review of Systems: No fevers, chills, night sweats, weight loss, chest pain, or shortness of breath.   Objective:    General: Well Developed, well nourished, and in no acute distress.  Neuro: Alert and oriented x3, extra-ocular muscles intact, sensation grossly intact.  HEENT: Normocephalic, atraumatic, pupils equal round reactive to light, neck supple, no masses, no lymphadenopathy, thyroid nonpalpable.  Skin: Warm and dry, no rashes. Cardiac: Regular rate and rhythm, no murmurs rubs or gallops, no lower extremity edema.  Respiratory: Clear to auscultation bilaterally. Not using accessory muscles, speaking in full sentences. Back Exam:  Inspection: Unremarkable  Motion: Flexion 45 deg, Extension 45 deg, Side Bending to 45 deg bilaterally,  Rotation to 45 deg bilaterally  SLR laying: Negative  XSLR laying: Negative  Palpable tenderness: None. FABER: negative. Sensory change: Gross sensation intact to all lumbar and sacral dermatomes.  Reflexes: 2+ at both patellar tendons, 2+ at achilles tendons, Babinski's downgoing.  Strength at foot  Plantar-flexion: 5/5  Dorsi-flexion: 5/5 Eversion: 5/5 Inversion: 5/5  Leg strength  Quad: 5/5 Hamstring: 5/5 Hip flexor: 5/5 Hip abductors: 5/5  Gait unremarkable.  Impression and Recommendations:

## 2013-10-09 NOTE — Assessment & Plan Note (Signed)
Still considering supplementation but wants to get his back figured out first.

## 2013-10-09 NOTE — Assessment & Plan Note (Signed)
Continues to be pain free months after injection.

## 2013-10-09 NOTE — Assessment & Plan Note (Signed)
Right-sided L5. X-rays do show L4-L5 degenerative disc disease of L4 on L5 spondylolisthesis. Doing very well with conservative measures, exercises, I'm going to add a single additional taper of prednisone. Followup in 3 months for this.

## 2014-01-09 ENCOUNTER — Ambulatory Visit: Payer: BC Managed Care – PPO | Admitting: Sports Medicine

## 2014-02-06 ENCOUNTER — Ambulatory Visit: Payer: BC Managed Care – PPO | Admitting: Sports Medicine

## 2014-02-13 ENCOUNTER — Ambulatory Visit (INDEPENDENT_AMBULATORY_CARE_PROVIDER_SITE_OTHER): Payer: BC Managed Care – PPO | Admitting: Sports Medicine

## 2014-02-13 ENCOUNTER — Encounter: Payer: Self-pay | Admitting: Sports Medicine

## 2014-02-13 VITALS — BP 117/67 | HR 67 | Ht 68.0 in | Wt 206.0 lb

## 2014-02-13 DIAGNOSIS — M75100 Unspecified rotator cuff tear or rupture of unspecified shoulder, not specified as traumatic: Secondary | ICD-10-CM

## 2014-02-13 DIAGNOSIS — M7550 Bursitis of unspecified shoulder: Secondary | ICD-10-CM

## 2014-02-13 DIAGNOSIS — M545 Low back pain, unspecified: Secondary | ICD-10-CM

## 2014-02-13 DIAGNOSIS — M5136 Other intervertebral disc degeneration, lumbar region with discogenic back pain only: Secondary | ICD-10-CM

## 2014-02-13 DIAGNOSIS — IMO0002 Reserved for concepts with insufficient information to code with codable children: Secondary | ICD-10-CM

## 2014-02-13 DIAGNOSIS — E291 Testicular hypofunction: Secondary | ICD-10-CM

## 2014-02-13 MED ORDER — TESTOSTERONE CYPIONATE 200 MG/ML IM SOLN
200.0000 mg | INTRAMUSCULAR | Status: DC
  Administered 2014-02-13: 200 mg via INTRAMUSCULAR

## 2014-02-13 NOTE — Assessment & Plan Note (Signed)
Continues to do extremely well after injection middle of last year.

## 2014-02-13 NOTE — Progress Notes (Signed)
  Subjective:    CC: Followup  HPI: Subacromial bursitis: Only a little bit of pain, but not yet ready for an injection.  Hypogonadism:  Amenable to starting testosterone injections.  Low back pain: Resolved with conservative measures.  Past medical history, Surgical history, Family history not pertinant except as noted below, Social history, Allergies, and medications have been entered into the medical record, reviewed, and no changes needed.   Review of Systems: No fevers, chills, night sweats, weight loss, chest pain, or shortness of breath.   Objective:    General: Well Developed, well nourished, and in no acute distress.  Neuro: Alert and oriented x3, extra-ocular muscles intact, sensation grossly intact.  HEENT: Normocephalic, atraumatic, pupils equal round reactive to light, neck supple, no masses, no lymphadenopathy, thyroid nonpalpable.  Skin: Warm and dry, no rashes. Cardiac: Regular rate and rhythm, no murmurs rubs or gallops, no lower extremity edema.  Respiratory: Clear to auscultation bilaterally. Not using accessory muscles, speaking in full sentences.  Impression and Recommendations:

## 2014-02-13 NOTE — Assessment & Plan Note (Signed)
Finally decided on testosterone supplementation. Topicals were too expensive, we are going to proceed with 200 mg of testosterone every 2 weeks intramuscular. He will do shot today, 2 weeks later, and then one week after that we will check his levels.

## 2014-02-13 NOTE — Assessment & Plan Note (Signed)
Resolved with conservative measures. 

## 2014-02-27 ENCOUNTER — Ambulatory Visit (INDEPENDENT_AMBULATORY_CARE_PROVIDER_SITE_OTHER): Payer: BC Managed Care – PPO | Admitting: Family Medicine

## 2014-02-27 VITALS — BP 117/73 | HR 78 | Ht 68.0 in | Wt 208.0 lb

## 2014-02-27 DIAGNOSIS — E291 Testicular hypofunction: Secondary | ICD-10-CM

## 2014-02-27 MED ORDER — TESTOSTERONE CYPIONATE 200 MG/ML IM SOLN
200.0000 mg | INTRAMUSCULAR | Status: DC
  Administered 2014-02-27: 200 mg via INTRAMUSCULAR

## 2014-02-27 NOTE — Progress Notes (Signed)
Patient was in office for Testosterone ingection 200 mg was given RUOQ. Patient denied any headaches, dizziness or shortness of breath or anything abnormal since he stated taking the Testosterone. Loyalty Brashier,CMA

## 2014-03-07 LAB — TESTOSTERONE: Testosterone: 771 ng/dL (ref 300–890)

## 2014-03-13 ENCOUNTER — Ambulatory Visit: Payer: BC Managed Care – PPO

## 2014-03-13 ENCOUNTER — Encounter: Payer: Self-pay | Admitting: *Deleted

## 2014-03-13 ENCOUNTER — Ambulatory Visit (INDEPENDENT_AMBULATORY_CARE_PROVIDER_SITE_OTHER): Payer: BC Managed Care – PPO | Admitting: Sports Medicine

## 2014-03-13 VITALS — BP 94/59 | HR 73 | Wt 207.0 lb

## 2014-03-13 DIAGNOSIS — E291 Testicular hypofunction: Secondary | ICD-10-CM

## 2014-03-13 MED ORDER — TESTOSTERONE CYPIONATE 200 MG/ML IM SOLN
200.0000 mg | Freq: Once | INTRAMUSCULAR | Status: AC
  Administered 2014-03-13: 200 mg via INTRAMUSCULAR

## 2014-03-13 NOTE — Assessment & Plan Note (Signed)
Testosterone injection as above. 

## 2014-03-13 NOTE — Progress Notes (Signed)
   Subjective:    Patient ID: Dominic Ray, male    DOB: 23-Apr-1956, 58 y.o.   MRN: [id number] Pt in for testosterone injection.  He states that he can't really tell a difference in the way he feels after starting the supplements. Injection given IM LUOQ. Beatris Ship, CMA HPI    Review of Systems     Objective:   Physical Exam        Assessment & Plan:

## 2014-03-27 ENCOUNTER — Ambulatory Visit (INDEPENDENT_AMBULATORY_CARE_PROVIDER_SITE_OTHER): Payer: BC Managed Care – PPO | Admitting: Sports Medicine

## 2014-03-27 VITALS — BP 126/79 | HR 71 | Wt 205.0 lb

## 2014-03-27 DIAGNOSIS — E291 Testicular hypofunction: Secondary | ICD-10-CM

## 2014-03-27 MED ORDER — TESTOSTERONE CYPIONATE 200 MG/ML IM SOLN
200.0000 mg | Freq: Once | INTRAMUSCULAR | Status: AC
  Administered 2014-03-27: 200 mg via INTRAMUSCULAR

## 2014-03-27 NOTE — Assessment & Plan Note (Signed)
Testosterone injection as above. 

## 2014-03-27 NOTE — Progress Notes (Signed)
   Subjective:    Patient ID: Dominic Ray, male    DOB: 06-Mar-1956, 58 y.o.   MRN: [id number]  HPI   Pt here for testosterone injection   Review of Systems     Objective:   Physical Exam        Assessment & Plan:  Given without complication

## 2014-04-10 ENCOUNTER — Encounter: Payer: Self-pay | Admitting: *Deleted

## 2014-04-10 ENCOUNTER — Ambulatory Visit (INDEPENDENT_AMBULATORY_CARE_PROVIDER_SITE_OTHER): Payer: BC Managed Care – PPO | Admitting: Sports Medicine

## 2014-04-10 VITALS — BP 111/72 | HR 74

## 2014-04-10 DIAGNOSIS — E291 Testicular hypofunction: Secondary | ICD-10-CM

## 2014-04-10 MED ORDER — TESTOSTERONE CYPIONATE 200 MG/ML IM SOLN: 200.0000 mg | Freq: Once | INTRAMUSCULAR | Status: DC

## 2014-04-10 NOTE — Assessment & Plan Note (Signed)
Testosterone injection as above. 

## 2014-04-10 NOTE — Progress Notes (Signed)
   Subjective:    Patient ID: Dominic Ray, male    DOB: 10/18/1956, 57 y.o.   MRN: [id number] Pt here for testosterone injection. 212ml given in the LUOQ with no complications. Answered some questions pt had. Oscar La, LPN  HPI    Review of Systems     Objective:   Physical Exam        Assessment & Plan:

## 2014-04-24 ENCOUNTER — Ambulatory Visit: Payer: BC Managed Care – PPO

## 2014-10-16 ENCOUNTER — Other Ambulatory Visit: Payer: Self-pay | Admitting: Sports Medicine

## 2016-01-11 ENCOUNTER — Other Ambulatory Visit: Payer: Self-pay | Admitting: Sports Medicine

## 2016-06-13 ENCOUNTER — Encounter: Payer: Self-pay | Admitting: Sports Medicine

## 2016-06-13 ENCOUNTER — Ambulatory Visit (INDEPENDENT_AMBULATORY_CARE_PROVIDER_SITE_OTHER): Payer: BLUE CROSS/BLUE SHIELD | Admitting: Sports Medicine

## 2016-06-13 DIAGNOSIS — R05 Cough: Secondary | ICD-10-CM

## 2016-06-13 DIAGNOSIS — R059 Cough, unspecified: Secondary | ICD-10-CM | POA: Insufficient documentation

## 2016-06-13 MED ORDER — HYDROCOD POLST-CPM POLST ER 10-8 MG/5ML PO SUER: 5.0000 mL | Freq: Two times a day (BID) | ORAL | 0 refills | Status: DC | PRN

## 2016-06-13 NOTE — Progress Notes (Signed)
  Subjective:    CC: cough   HPI: 60 yo M presenting with two weeks of cough.  He says that two weeks ago he developed a sore throat, runny nose, cough, headache, and myalgias which resolved within a couple of days with the exception of the cough.  He has had a productive cough for the past two weeks which is worse at night.  Over the past three days the cough has gotten better and now only occurs at night.  He says he coughs up purulent sputum.  He is no longer coughing during the day.  He takes Robitussen at night without much relief.  He denies any other symptoms - no fevers, fatigue, sore throat, runny nose, SOB.  He does not have a history of GERD and does not eat within two hours of going to bed.  He denies any history of CAD, orthopnea, lower extremity edema.  He is not a smoker.    Past medical history, Surgical history, Family history not pertinant except as noted below, Social history, Allergies, and medications have been entered into the medical record, reviewed, and no changes needed.   Review of Systems: No fevers, chills, night sweats, weight loss, chest pain, or shortness of breath. Positive for cough   Objective:    General: Well Developed, well nourished, and in no acute distress.  Neuro: Alert and oriented x3, extra-ocular muscles intact, sensation grossly intact.  HEENT: Normocephalic, atraumatic, pupils equal round reactive to light, neck supple, no masses, no lymphadenopathy, thyroid nonpalpable. Normal TMs.  No tonsillar erythema or exudates.  Skin: Warm and dry, no rashes. Cardiac: Regular rate and rhythm, no murmurs rubs or gallops, no lower extremity edema.  Respiratory: Clear to auscultation bilaterally. Not using accessory muscles, speaking in full sentences.   Impression and Recommendations:   1. Cough -Likely post-viral cough from URI  -Will prescribe Tussionex for cough  -Counseled that post-viral cough can last up to 4 weeks -If cough has not resolved by end of  the week, consider CXR to r/o pneumonia

## 2016-06-13 NOTE — Assessment & Plan Note (Signed)
Symptoms are classic for postinfectious cough after a viral infection. Cough can linger for 4 weeks, I'm going to add Tussionex at bedtime. If cough does not get better within the next several days we will get a chest x-ray, he will call me for this.

## 2016-12-19 DIAGNOSIS — M5136 Other intervertebral disc degeneration, lumbar region: Secondary | ICD-10-CM | POA: Diagnosis not present

## 2016-12-21 DIAGNOSIS — M1611 Unilateral primary osteoarthritis, right hip: Secondary | ICD-10-CM | POA: Diagnosis not present

## 2017-02-25 ENCOUNTER — Other Ambulatory Visit: Payer: Self-pay | Admitting: Sports Medicine

## 2017-03-15 ENCOUNTER — Telehealth: Payer: Self-pay

## 2017-03-15 NOTE — Telephone Encounter (Signed)
Pt called and wants a stress test.  He has had chest tightness several times recently.  I went over the warning signs of when to go to the ER.  Pt stated that the 2 times it has happened he had forgotten to take his lisinopril for a couple of days.  He has tried to replicate it several times with exertion, and has not been able to.  He has an appointment with you next Thursday.

## 2017-03-16 ENCOUNTER — Encounter: Payer: Self-pay | Admitting: *Deleted

## 2017-03-16 ENCOUNTER — Other Ambulatory Visit: Payer: Self-pay

## 2017-03-16 ENCOUNTER — Emergency Department (INDEPENDENT_AMBULATORY_CARE_PROVIDER_SITE_OTHER)
Admission: EM | Admit: 2017-03-16 | Discharge: 2017-03-16 | Disposition: A | Discharge status: Home / Self Care | Payer: BLUE CROSS/BLUE SHIELD | Source: Home / Self Care | Attending: Family Medicine | Admitting: Family Medicine

## 2017-03-16 ENCOUNTER — Emergency Department (INDEPENDENT_AMBULATORY_CARE_PROVIDER_SITE_OTHER): Payer: BLUE CROSS/BLUE SHIELD

## 2017-03-16 DIAGNOSIS — I700 Atherosclerosis of aorta: Secondary | ICD-10-CM | POA: Diagnosis not present

## 2017-03-16 DIAGNOSIS — R0789 Other chest pain: Secondary | ICD-10-CM

## 2017-03-16 DIAGNOSIS — R079 Chest pain, unspecified: Secondary | ICD-10-CM | POA: Diagnosis not present

## 2017-03-16 NOTE — ED Provider Notes (Signed)
[facility]    CSN: [id number] Arrival date & time: 03/16/17  1347     History   Chief Complaint Chief Complaint  Patient presents with  . Chest Pain    HPI Dominic Ray is a 61 y.o. male.   Three days ago while sitting in his car patient experienced brief (less than 10 seconds) non-radiating pressure-like sensation in his anterior chest that resolved spontaneously.  No shortness of breath.  Two days ago after playing softball, patient was sitting on bench and developed a second brief episode of similar chest discomfort.  He denies any chest symptoms while exercising, and notes that yesterday he did water aerobics for about 50 minutes without difficulty.  No recent injury to chest. Review of records reveals a normal EKG done 09/26/2012, and a negative exercise stress test done 09/26/2012. He has a family history of ASCVD:  2 uncles had coronary artery stents place.   His lipids have been mildly elevated in the past.  He states that he takes aspirin 81mg  daily.   The history is provided by the patient.  Chest Pain  Pain location:  Substernal area Pain quality: sharp   Pain radiates to:  Does not radiate Pain severity:  Mild Onset quality:  Sudden Timing:  Sporadic Progression:  Resolved Chronicity:  Recurrent Context: at rest   Context: not stress   Relieved by:  None tried Worsened by:  Nothing Ineffective treatments:  None tried Associated symptoms: no abdominal pain, no AICD problem, no claudication, no cough, no diaphoresis, no dizziness, no dysphagia, no fatigue, no fever, no heartburn, no lower extremity edema, no near-syncope, no numbness, no palpitations, no PND, no shortness of breath, no syncope and no weakness   Risk factors: male sex     Past Medical History:  Diagnosis Date  . Basal cell carcinoma of nasal tip 08/24/2012  . Hyperlipidemia 09/05/2012  . Hypertension 08/24/2012  . Male hypogonadism 08/24/2012  . Onychomycosis 08/24/2012  .  Preventive measure 09/05/2012    Patient Active Problem List   Diagnosis Date Noted  . Cough 06/13/2016  . Discogenic low back pain 04/17/2013  . Subacromial bursitis 10/04/2012  . Hyperlipidemia 09/05/2012  . Preventive measure 09/05/2012  . Hypertension 08/24/2012  . Atypical chest pain 08/24/2012  . Onychomycosis 08/24/2012  . Male hypogonadism 08/24/2012  . Basal cell carcinoma of nasal tip 08/24/2012    Past Surgical History:  Procedure Laterality Date  . TONSILLECTOMY  1968       Home Medications    Prior to Admission medications   Medication Sig Start Date End Date Taking? Authorizing Provider  aspirin EC 81 MG tablet Take 1 tablet (81 mg total) by mouth daily. 12/06/12  Yes Silverio Decamp, MD  lisinopril-hydrochlorothiazide (PRINZIDE,ZESTORETIC) 20-25 MG tablet TAKE 1 TABLET BY MOUTH EVERY DAY 02/25/17  Yes Silverio Decamp, MD  testosterone cypionate (DEPOTESTOTERONE CYPIONATE) 200 MG/ML injection Inject 1 mL (200 mg total) into the muscle every 14 (fourteen) days. 02/13/14   Silverio Decamp, MD    Family History Family History  Problem Relation Age of Onset  . CAD Father     Died of MI at age 63  . Cancer Mother   . Colon cancer Neg Hx   . Stomach cancer Neg Hx     Social History Social History  Substance Use Topics  . Smoking status: Never Smoker  . Smokeless tobacco: Never Used  . Alcohol use Yes     Comment: Occasional  Allergies   [name] [lidocaine]   Review of Systems Review of Systems  Constitutional: Negative for diaphoresis, fatigue and fever.  HENT: Negative for trouble swallowing.   Respiratory: Negative for cough and shortness of breath.   Cardiovascular: Positive for chest pain. Negative for palpitations, claudication, syncope, PND and near-syncope.  Gastrointestinal: Negative for abdominal pain and heartburn.  Neurological: Negative for dizziness, weakness and numbness.  All other systems reviewed and are  negative.    Physical Exam Triage Vital Signs ED Triage Vitals [03/16/17 1437]  Enc Vitals Group     BP 116/70     Pulse Rate 64     Resp 16     Temp 98 F (36.7 C)     Temp Source Oral     SpO2 98 %     Weight 221 lb (100.2 kg)     Height 5\' 8"  (1.727 m)     Head Circumference      Peak Flow      Pain Score 0     Pain Loc      Pain Edu?      Excl. in [place]?    No data found.   Updated Vital Signs BP 116/70 (BP Location: Left Arm)   Pulse 64   Temp 98 F (36.7 C) (Oral)   Resp 16   Ht 5\' 8"  (1.727 m)   Wt 221 lb (100.2 kg)   SpO2 98%   BMI 33.60 kg/m   Visual Acuity Right Eye Distance:   Left Eye Distance:   Bilateral Distance:    Right Eye Near:   Left Eye Near:    Bilateral Near:     Physical Exam Nursing notes and Vital Signs reviewed. Appearance:  Patient appears stated age, and in no acute distress Eyes:  Pupils are equal, round, and reactive to light and accomodation.  Extraocular movement is intact.  Conjunctivae are not inflamed  Ears:  Normal externally. Nose:  Normal Pharynx:  Normal Neck:  No adenopathy or thyromegaly. Lungs:  Clear to auscultation.  Breath sounds are equal.  Moving air well. Heart:  Regular rate and rhythm without murmurs, rubs, or gallops.  Chest:  No tenderness to palpation  Abdomen:  Nontender without masses or hepatosplenomegaly.  Bowel sounds are present.  No CVA or flank tenderness.  Extremities:  No edema.  Skin:  No rash present.    UC Treatments / Results  Labs (all labs ordered are listed, but only abnormal results are displayed) Labs Reviewed - No data to display  EKG  EKG Interpretation  : Rate:  67 BPM PR:  172 msec QT:  368 msec QTcH:  388 msec QRSD:  86 msec QRS axis:  4 degrees Interpretation:  Within normal limits (no significant change from EKG done 09/26/2012)       Radiology Dg Chest 2 View  Result Date: 03/16/2017 CLINICAL DATA:  Intermittent mid sternal chest pain for the past 4 days.  History of hypertension, nonsmoker. EXAM: CHEST  2 VIEW COMPARISON:  None in PACs FINDINGS: The lungs are adequately inflated. There is minimal linear density in the lingula. There is no alveolar infiltrate or pleural effusion. There is no pneumothorax or pneumomediastinum. The retrosternal soft tissues are normal. The heart and pulmonary vascularity are normal. There is calcification in the wall of the aortic arch. The trachea is midline. The bony thorax exhibits no acute abnormality. IMPRESSION: Subsegmental atelectasis or scarring in the lingula. No alveolar pneumonia nor CHF. Thoracic aortic atherosclerosis.  Electronically Signed   By: David  Martinique M.D.   On: 03/16/2017 15:32    Procedures Procedures (including critical care time)  Medications Ordered in UC Medications - No data to display   Initial Impression / Assessment and Plan / UC Course  I have reviewed the triage vital signs and the nursing notes.  Pertinent labs & imaging results that were available during my care of the patient were reviewed by me and considered in my medical decision making (see chart for details).    Today's EKG normal and unchanged from previous.  Chest X-ray shows no acute changes.  Patient's ability to perform strenuous athletic activity without symptoms is reassuring. Recommend that he follow-up with [name].    Final Clinical Impressions(s) / UC Diagnoses   Final diagnoses:  Chest wall pain    New Prescriptions New Prescriptions   No medications on file     Kandra Nicolas, MD 03/17/17 [phone]

## 2017-03-16 NOTE — Telephone Encounter (Signed)
Before ordering a stress test I need more details on the quality of the chest pain and baseline ecg.  Also he had a normal one 5 years ago.  Sometimes if the CP is typical in nature we proceed straight to cath.  He should see one of our providers asap.

## 2017-03-16 NOTE — Discharge Instructions (Signed)
If symptoms become significantly worse during the night or over the weekend, proceed to the local emergency room.  

## 2017-03-16 NOTE — ED Triage Notes (Addendum)
Pt c/o 2 episodes of center of chest tightness; the first episode was 3 days ago while sitting in his car and the second episode was 2 days ago while playing softball. Denies pain, SOB, N, or jaw pain. The episodes only last about 15 seconds.

## 2017-03-23 ENCOUNTER — Ambulatory Visit: Payer: BLUE CROSS/BLUE SHIELD | Admitting: Sports Medicine

## 2017-03-23 NOTE — Telephone Encounter (Signed)
Pt was seen in UC and made an appointment to see [name].

## 2017-03-31 NOTE — Progress Notes (Signed)
    Dominic Fendt, MD Reason for referral-Chest pain  HPI: 60 year old male for evaluation of chest pain at request of Theone Murdoch, MD. Patient seen in this office previously but not since November 2013. Exercise treadmill November 2013 normal. Patient seen recently with chest pain. Chest x-ray May 2018 showed thoracic aortic atherosclerosis and atelectasis. Several weeks ago the patient had an episode of chest pain. 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 He developed tightness in his chest for 15 seconds without radiation or associated symptoms. The pain was not pleuritic, positional. He had another episode after playing softball for 10 seconds. He otherwise denies dyspnea on exertion, orthopnea, PND, pedal edema, palpitations, syncope or exertional chest pain. Because of the above we were asked to evaluate.  Current Outpatient Prescriptions  Medication Sig Dispense Refill  . aspirin EC 81 MG tablet Take 1 tablet (81 mg total) by mouth daily. 90 tablet 3  . lisinopril-hydrochlorothiazide (PRINZIDE,ZESTORETIC) 20-25 MG tablet TAKE 1 TABLET BY MOUTH EVERY DAY 90 tablet 3   No current facility-administered medications for this visit.     Allergies  Allergen Reactions  . [name] [Lidocaine] Rash     Past Medical History:  Diagnosis Date  . Basal cell carcinoma of nasal tip 08/24/2012  . Hyperlipidemia 09/05/2012  . Hypertension 08/24/2012  . Male hypogonadism 08/24/2012  . Onychomycosis 08/24/2012  . Preventive measure 09/05/2012    Past Surgical History:  Procedure Laterality Date  . TONSILLECTOMY  1968    Social History   Social History  . Marital status: Married    Spouse name: N/A  . Number of children: N/A  . Years of education: N/A   Occupational History  .      Unemployed   Social History Main Topics  . Smoking status: Never Smoker  . Smokeless tobacco: Never Used  . Alcohol use Yes     Comment: Occasional  . Drug use: No  . Sexual  activity: Not on file   Other Topics Concern  . Not on file   Social History Narrative  . No narrative on file    Family History  Problem Relation Age of Onset  . CAD Father        Died of MI at age 27  . Cancer Mother   . Colon cancer Neg Hx   . Stomach cancer Neg Hx     ROS: no fevers or chills, productive cough, hemoptysis, dysphasia, odynophagia, melena, hematochezia, dysuria, hematuria, rash, seizure activity, orthopnea, PND, pedal edema, claudication. Remaining systems are negative.  Physical Exam:   Blood pressure 108/70, pulse 65, height 5\' 8"  (1.727 m), weight 98.2 kg (216 lb 6.4 oz).  General:  Well developed/well nourished in NAD Skin warm/dry Patient not depressed No peripheral clubbing Back-normal HEENT-normal/normal eyelids Neck supple/normal carotid upstroke bilaterally; no bruits; no JVD; no thyromegaly chest - CTA/ normal expansion CV - RRR/normal S1 and S2; no murmurs, rubs or gallops;  PMI nondisplaced Abdomen -NT/ND, no HSM, no mass, + bowel sounds, no bruit 2+ femoral pulses, no bruits Ext-no edema, chords, 2+ DP Neuro-grossly nonfocal  ECG -  03/16/2017-sinus rhythm with no ST changes. personally reviewed  A/P  1 chest pain-symptoms are atypical. He is extremely concerned. We will arrange an exercise treadmill for risk stratification. Check echocardiogram for LV function and wall motion.  2 hypertension-blood pressure is controlled. Continue present medications.  3 hyperlipidemia-management per primary care.  Kirk Ruths, MD

## 2017-04-05 ENCOUNTER — Ambulatory Visit (INDEPENDENT_AMBULATORY_CARE_PROVIDER_SITE_OTHER): Payer: BLUE CROSS/BLUE SHIELD | Admitting: Cardiology

## 2017-04-05 ENCOUNTER — Encounter: Payer: Self-pay | Admitting: Cardiology

## 2017-04-05 VITALS — BP 108/70 | HR 65 | Ht 68.0 in | Wt 216.4 lb

## 2017-04-05 DIAGNOSIS — I10 Essential (primary) hypertension: Secondary | ICD-10-CM | POA: Diagnosis not present

## 2017-04-05 DIAGNOSIS — E7800 Pure hypercholesterolemia, unspecified: Secondary | ICD-10-CM | POA: Diagnosis not present

## 2017-04-05 DIAGNOSIS — R072 Precordial pain: Secondary | ICD-10-CM

## 2017-04-05 NOTE — Patient Instructions (Signed)
Medication Instructions:   NO CHANGE  Testing/Procedures:  Your physician has requested that you have an echocardiogram. Echocardiography is a painless test that uses sound waves to create images of your heart. It provides your doctor with information about the size and shape of your heart and how well your heart's chambers and valves are working. This procedure takes approximately one hour. There are no restrictions for this procedure.   Your physician has requested that you have an exercise tolerance test. For further information please visit [email or web address]. Please also follow instruction sheet, as given.    Follow-Up:  Your physician recommends that you schedule a follow-up appointment in: AS NEEDED    Exercise Stress Electrocardiogram An exercise stress electrocardiogram is a test to check how blood flows to your heart. It is done to find areas of poor blood flow. You will need to walk on a treadmill for this test. The electrocardiogram will record your heartbeat when you are at rest and when you are exercising. What happens before the procedure?  Do not have drinks with caffeine or foods with caffeine for 24 hours before the test, or as told by your doctor. This includes coffee, tea (even decaf tea), sodas, chocolate, and cocoa.  Follow your doctor's instructions about eating and drinking before the test.  Ask your doctor what medicines you should or should not take before the test. Take your medicines with water unless told by your doctor not to.  If you use an inhaler, bring it with you to the test.  Bring a snack to eat after the test.  Do not  smoke for 4 hours before the test.  Do not put lotions, powders, creams, or oils on your chest before the test.  Wear comfortable shoes and clothing. What happens during the procedure?  You will have patches put on your chest. Small areas of your chest may need to be shaved. Wires will be connected to the patches.  Your  heart rate will be watched while you are resting and while you are exercising.  You will walk on the treadmill. The treadmill will slowly get faster to raise your heart rate.  The test will take about 1-2 hours. What happens after the procedure?  Your heart rate and blood pressure will be watched after the test.  You may return to your normal diet, activities, and medicines or as told by your doctor. This information is not intended to replace advice given to you by your health care provider. Make sure you discuss any questions you have with your health care provider. Document Released: 04/18/2008 Document Revised: 06/29/2016 Document Reviewed: 07/08/2013 Elsevier Interactive Patient Education  2017 [employer].

## 2017-04-12 ENCOUNTER — Ambulatory Visit (HOSPITAL_BASED_OUTPATIENT_CLINIC_OR_DEPARTMENT_OTHER)
Admission: RE | Admit: 2017-04-12 | Discharge: 2017-04-12 | Disposition: A | Discharge status: Home / Self Care | Payer: BLUE CROSS/BLUE SHIELD | Source: Ambulatory Visit | Attending: Cardiology | Admitting: Cardiology

## 2017-04-12 DIAGNOSIS — E785 Hyperlipidemia, unspecified: Secondary | ICD-10-CM | POA: Diagnosis not present

## 2017-04-12 DIAGNOSIS — R072 Precordial pain: Secondary | ICD-10-CM | POA: Diagnosis not present

## 2017-04-12 DIAGNOSIS — I10 Essential (primary) hypertension: Secondary | ICD-10-CM | POA: Insufficient documentation

## 2017-04-12 DIAGNOSIS — I071 Rheumatic tricuspid insufficiency: Secondary | ICD-10-CM | POA: Diagnosis not present

## 2017-04-12 NOTE — Progress Notes (Signed)
  Echocardiogram 2D Echocardiogram has been performed.  Dominic Ray 04/12/2017, 1:20 PM

## 2017-04-12 NOTE — Progress Notes (Deleted)
  Echocardiogram 2D Echocardiogram has been performed.  Donata Clay 04/12/2017, 11:26 AM

## 2017-04-14 HISTORY — PX: CARDIOVASCULAR STRESS TEST: SHX262

## 2017-04-20 ENCOUNTER — Telehealth (HOSPITAL_COMMUNITY): Payer: Self-pay

## 2017-04-20 NOTE — Telephone Encounter (Signed)
Encounter complete. 

## 2017-04-25 ENCOUNTER — Encounter (HOSPITAL_COMMUNITY): Payer: Self-pay | Admitting: *Deleted

## 2017-04-25 ENCOUNTER — Ambulatory Visit (HOSPITAL_COMMUNITY)
Admission: RE | Admit: 2017-04-25 | Discharge: 2017-04-25 | Disposition: A | Discharge status: Home / Self Care | Payer: BLUE CROSS/BLUE SHIELD | Source: Ambulatory Visit | Attending: Cardiovascular Disease | Admitting: Cardiovascular Disease

## 2017-04-25 DIAGNOSIS — R072 Precordial pain: Secondary | ICD-10-CM | POA: Insufficient documentation

## 2017-04-25 LAB — EXERCISE TOLERANCE TEST
Estimated workload: 0 METS
Exercise duration (min): 10 min
Exercise duration (sec): 10 s
MPHR: 160 {beats}/min
Peak HR: 164 {beats}/min
Percent HR: 102 %
RPE: 17
Rest HR: 63 {beats}/min

## 2017-04-25 NOTE — Progress Notes (Signed)
Dr. Oval Linsey reviewed ETT and since patient did not have chest pain it is alright to leave.

## 2017-04-30 NOTE — Progress Notes (Addendum)
Cardiology Office Note    Date:  05/01/2017  ID:  Dominic Ray, DOB [birth year], MRN [id number] PCP:  Silverio Decamp, MD  Cardiologist:  [name]   Chief Complaint: discuss abormal stress test  History of Present Illness:  Dominic Ray is a 61 y.o. male with history of HTN, HLD, family history of CAD who recently saw 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 2D Echo 04/12/17 showed mild LVH, EF 60-65%, no RWMA, normal diastolic parameters, mild LAE. ETT 04/25/17 was abnormal with 2 mm ST depression in inferior leads and lead 1 beginning at peak exercise. He was referred back to clinic to discuss cath. No recent labs on file for review - last LDL 142 and Cr 1.07 in 2014.  [name] recommended he return to clinic today to arrange cardiac cath. He returns for followup with his wife today. No recurrent chest pain. No dyspnea. No syncope. Denies prior h/o TIA, stroke or bleeding. Has not had cholesterol checked in a while. Has lots of questions about procedure and possible outcomes.   Past Medical History:  Diagnosis Date  . Basal cell carcinoma of nasal tip 08/24/2012  . Hyperlipidemia 09/05/2012  . Hypertension 08/24/2012  . Male hypogonadism 08/24/2012  . Onychomycosis 08/24/2012  . Preventive measure 09/05/2012    Past Surgical History:  Procedure Laterality Date  . TONSILLECTOMY  1968    Current Medications: Current Outpatient Prescriptions  Medication Sig Dispense Refill  . aspirin EC 81 MG tablet Take 1 tablet (81 mg total) by mouth daily. 90 tablet 3  . lisinopril-hydrochlorothiazide (PRINZIDE,ZESTORETIC) 20-25 MG tablet TAKE 1 TABLET BY MOUTH EVERY DAY 90 tablet 3   No current facility-administered medications for this visit.      Allergies:   [name] [lidocaine]   Social History   Social History  . Marital status: Married    Spouse name: N/A  . Number of children: N/A    . Years of education: N/A   Occupational History  .      Unemployed   Social History Main Topics  . Smoking status: Never Smoker  . Smokeless tobacco: Never Used  . Alcohol use Yes     Comment: Occasional  . Drug use: No  . Sexual activity: Not Asked   Other Topics Concern  . None   Social History Narrative  . None     Family History:  Family History  Problem Relation Age of Onset  . CAD Father        Died of MI at age 76  . Cancer Mother   . Colon cancer Neg Hx   . Stomach cancer Neg Hx     ROS:   Please see the history of present illness. All other systems are reviewed and otherwise negative.    PHYSICAL EXAM:   VS:  BP 112/70 (BP Location: Left Arm)   Pulse 65   Ht 5\' 9"  (1.753 m)   Wt 220 lb 9.6 oz (100.1 kg)   BMI 32.58 kg/m   BMI: Body mass index is 32.58 kg/m. GEN: Well nourished, well developed WM, in no acute distress  HEENT: normocephalic, atraumatic Neck: no JVD, carotid bruits, or masses Cardiac: RRR; no murmurs, rubs, or gallops, no edema  Respiratory:  clear to auscultation bilaterally, normal work of breathing GI: soft, nontender, nondistended, + BS MS: no deformity or atrophy  Skin: warm and dry, no  rash Neuro:  Alert and Oriented x 3, Strength and sensation are intact, follows commands Psych: euthymic mood, full affect  Wt Readings from Last 3 Encounters:  05/01/17 220 lb 9.6 oz (100.1 kg)  04/05/17 216 lb 6.4 oz (98.2 kg)  03/16/17 221 lb (100.2 kg)      Studies/Labs Reviewed:   EKG:  EKG was ordered today and personally reviewed by me and demonstrates NSR 65bpm, TWI III, no acute ST-T changes.  Recent Labs: No results found for requested labs within last 8760 hours.   Lipid Panel    Component Value Date/Time   CHOL 218 (H) 12/06/2012 0857   TRIG 179 (H) 12/06/2012 0857   HDL 40 12/06/2012 0857   CHOLHDL 5.5 12/06/2012 0857   VLDL 36 12/06/2012 0857   LDLCALC 142 (H) 12/06/2012 0857    Additional studies/ records that  were reviewed today include: Summarized above.    ASSESSMENT & PLAN:   1. Abnormal stress test/recent angina pectoris - Cath recommended by [name]. Discussed in detail with patient today. Long discussion about potential outcomes. Risks and benefits of cardiac catheterization have been discussed with the patient. These include bleeding, infection, kidney damage, stroke, heart attack, death. The patient understands these risks and is willing to proceed. He wishes to schedule this early next week. Discussed risk of delaying procedure; he verbalized understanding. Continue ASA. Add SL NTG PRN. Discussed use. Warning sx/ER precautions reviewed. Check pre-cath labs. Addendum: reviewed above with [name] with regard to whether or not to add antianginal prior to cath; he wishes to proceed with cath before addition of any new agents. 2. Essential HTN - controlled. 3. Hyperlipidemia - check CMET/lipids today. Plan to add statin if appropriate.  Disposition: F/u with Dr. Melynda Ripple team APP in 3 weeks.  Medication Adjustments/Labs and Tests Ordered: Current medicines are reviewed at length with the patient today.  Concerns regarding medicines are outlined above. Medication changes, Labs and Tests ordered today are summarized above and listed in the Patient Instructions accessible in Encounters.   Signed, Charlie Pitter, PA-C  05/01/2017 11:36 AM    [facility] [place], [place], [place]  [address] Phone: [phone]; Fax: [phone]

## 2017-05-01 ENCOUNTER — Other Ambulatory Visit: Payer: Self-pay | Admitting: Physician Assistant

## 2017-05-01 ENCOUNTER — Ambulatory Visit (INDEPENDENT_AMBULATORY_CARE_PROVIDER_SITE_OTHER): Payer: BLUE CROSS/BLUE SHIELD | Admitting: Physician Assistant

## 2017-05-01 ENCOUNTER — Other Ambulatory Visit: Payer: Self-pay | Admitting: *Deleted

## 2017-05-01 ENCOUNTER — Encounter: Payer: Self-pay | Admitting: Physician Assistant

## 2017-05-01 VITALS — BP 112/70 | HR 65 | Ht 69.0 in | Wt 220.6 lb

## 2017-05-01 DIAGNOSIS — R9439 Abnormal result of other cardiovascular function study: Secondary | ICD-10-CM

## 2017-05-01 DIAGNOSIS — I10 Essential (primary) hypertension: Secondary | ICD-10-CM

## 2017-05-01 DIAGNOSIS — I209 Angina pectoris, unspecified: Secondary | ICD-10-CM

## 2017-05-01 DIAGNOSIS — E785 Hyperlipidemia, unspecified: Secondary | ICD-10-CM

## 2017-05-01 MED ORDER — NITROGLYCERIN 0.4 MG SL SUBL: 0.4000 mg | SUBLINGUAL_TABLET | SUBLINGUAL | 3 refills | Status: DC | PRN

## 2017-05-01 NOTE — Patient Instructions (Signed)
Medication Instructions:  Your physician recommends that you continue on your current medications as directed. Please refer to the Current Medication list given to you today.   Labwork: Your physician recommends that you return for lab work today for CMET, CBC, LIPID, PT.INR  Testing/Procedures:   [facility] OFF[facility] [address] [place] [address] Dept: [phone] Loc: [phone]  Dominic Ray  05/01/2017  You are scheduled for a left heart cath on Monday June 25th by Dr. Claiborne Billings.   1. Please arrive at the [facility] (Main Entrance A) at [facility]: [address] [place], [place] [address] at 5:30  (two hours before your procedure to ensure your preparation). Free valet parking service is available.   Special note: Every effort is made to have your procedure done on time. Please understand that emergencies sometimes delay scheduled procedures.  2. Diet: nothing to eat or drink after midnight the night before.      5. Plan for one night stay--bring personal belongings. 6. Bring a current list of your medications and current insurance cards. 7. You MUST have a responsible person to drive you home. 8. Someone MUST be with you the first 24 hours after you arrive home or your discharge will be delayed. 9. Please wear clothes that are easy to get on and off and wear slip-on shoes.  Thank you for allowing [place] to care for you!   -- [facility]   Follow-Up: Your physician recommends that you schedule a follow-up appointment in: 3 weeks with [name]   Any Other Special Instructions Will Be Listed Below (If Applicable).     If you need a refill on your cardiac medications before your next appointment, please call your pharmacy.

## 2017-05-01 NOTE — Addendum Note (Signed)
Addended by: Eulis Foster on: 05/01/2017 12:18 PM   Modules accepted: Orders

## 2017-05-02 ENCOUNTER — Telehealth: Payer: Self-pay | Admitting: *Deleted

## 2017-05-02 LAB — COMPREHENSIVE METABOLIC PANEL
ALT: 18 IU/L (ref 0–44)
AST: 14 IU/L (ref 0–40)
Albumin/Globulin Ratio: 1.4 (ref 1.2–2.2)
Albumin: 4.2 g/dL (ref 3.6–4.8)
Alkaline Phosphatase: 68 IU/L (ref 39–117)
BUN/Creatinine Ratio: 14 (ref 10–24)
BUN: 13 mg/dL (ref 8–27)
Bilirubin Total: 0.5 mg/dL (ref 0.0–1.2)
CO2: 25 mmol/L (ref 20–29)
Calcium: 9.4 mg/dL (ref 8.6–10.2)
Chloride: 96 mmol/L (ref 96–106)
Creatinine, Ser: 0.91 mg/dL (ref 0.76–1.27)
GFR calc Af Amer: 106 mL/min/{1.73_m2} (ref >59)
GFR calc non Af Amer: 91 mL/min/{1.73_m2} (ref >59)
Globulin, Total: 3 g/dL (ref 1.5–4.5)
Glucose: 84 mg/dL (ref 65–99)
Potassium: 4 mmol/L (ref 3.5–5.2)
Sodium: 138 mmol/L (ref 134–144)
Total Protein: 7.2 g/dL (ref 6.0–8.5)

## 2017-05-02 LAB — LIPID PANEL
Chol/HDL Ratio: 4.6 ratio (ref 0.0–5.0)
Cholesterol, Total: 203 mg/dL — ABNORMAL HIGH (ref 100–199)
HDL: 44 mg/dL (ref >39)
LDL Calculated: 124 mg/dL — ABNORMAL HIGH (ref 0–99)
Triglycerides: 176 mg/dL — ABNORMAL HIGH (ref 0–149)
VLDL Cholesterol Cal: 35 mg/dL (ref 5–40)

## 2017-05-02 LAB — PROTIME-INR
INR: 1 (ref 0.8–1.2)
Prothrombin Time: 10.5 s (ref 9.1–12.0)

## 2017-05-02 LAB — CBC
Hematocrit: 46.4 % (ref 37.5–51.0)
Hemoglobin: 15.3 g/dL (ref 13.0–17.7)
MCH: 28.7 pg (ref 26.6–33.0)
MCHC: 33 g/dL (ref 31.5–35.7)
MCV: 87 fL (ref 79–97)
Platelets: 283 10*3/uL (ref 150–379)
RBC: 5.34 x10E6/uL (ref 4.14–5.80)
RDW: 14.2 % (ref 12.3–15.4)
WBC: 7.2 10*3/uL (ref 3.4–10.8)

## 2017-05-02 LAB — PLEASE NOTE

## 2017-05-02 MED ORDER — ATORVASTATIN CALCIUM 40 MG PO TABS: 40.0000 mg | ORAL_TABLET | Freq: Every day | ORAL | 3 refills | Status: DC

## 2017-05-02 NOTE — Telephone Encounter (Signed)
-----   Message from Charlie Pitter, [place] sent at 05/02/2017 11:47 AM EDT ----- Please let patient know LDL is elevated. Given his abnormal stress test, would recommend initiation of Lipitor 40mg  qpm (30 with 3 refills). Will see how he tolerates statin and plan for f/u labs at time of post cath f/u.  If CAD present, may need 80mg  nightly but will follow. Dayna Dunn PA-C

## 2017-05-05 ENCOUNTER — Telehealth: Payer: Self-pay

## 2017-05-05 NOTE — Telephone Encounter (Signed)
Left detailed message on VM per DPR:  Patient contacted pre-catheterization at [facility] scheduled for: 05/08/2017 @ 0730  Verified arrival time and place:  [facility] @ 0530  Confirmed AM meds to be taken pre-cath with sip of water:  Notified Pt to take his ASA prior to arrival.  Notified Pt to hold his lisinopril/HCTZ.  Confirmed patient has responsible person to drive home post procedure and observe patient for 24 hours:  Notified Pt he will need a ride home and someone to be with him for 24 hours  Addl concerns:  Left this nurse name and # if Pt has any questions.

## 2017-05-08 ENCOUNTER — Inpatient Hospital Stay (HOSPITAL_COMMUNITY)
Admission: AD | Disposition: A | Discharge status: Home / Self Care | Payer: Self-pay | Source: Ambulatory Visit | Attending: Surgery

## 2017-05-08 ENCOUNTER — Inpatient Hospital Stay (HOSPITAL_COMMUNITY)
Admission: AD | Admit: 2017-05-08 | Discharge: 2017-05-13 | DRG: 234 | Disposition: A | Discharge status: Home / Self Care | Payer: BLUE CROSS/BLUE SHIELD | Source: Ambulatory Visit | Attending: Surgery | Admitting: Surgery

## 2017-05-08 ENCOUNTER — Other Ambulatory Visit: Payer: Self-pay | Admitting: *Deleted

## 2017-05-08 ENCOUNTER — Inpatient Hospital Stay (HOSPITAL_COMMUNITY): Payer: BLUE CROSS/BLUE SHIELD

## 2017-05-08 ENCOUNTER — Encounter (HOSPITAL_COMMUNITY): Payer: Self-pay | Admitting: General Practice

## 2017-05-08 DIAGNOSIS — Z888 Allergy status to other drugs, medicaments and biological substances status: Secondary | ICD-10-CM | POA: Diagnosis not present

## 2017-05-08 DIAGNOSIS — I440 Atrioventricular block, first degree: Secondary | ICD-10-CM | POA: Diagnosis present

## 2017-05-08 DIAGNOSIS — Z85828 Personal history of other malignant neoplasm of skin: Secondary | ICD-10-CM

## 2017-05-08 DIAGNOSIS — Z8249 Family history of ischemic heart disease and other diseases of the circulatory system: Secondary | ICD-10-CM

## 2017-05-08 DIAGNOSIS — E8770 Fluid overload, unspecified: Secondary | ICD-10-CM | POA: Diagnosis present

## 2017-05-08 DIAGNOSIS — I25119 Atherosclerotic heart disease of native coronary artery with unspecified angina pectoris: Principal | ICD-10-CM | POA: Diagnosis present

## 2017-05-08 DIAGNOSIS — E669 Obesity, unspecified: Secondary | ICD-10-CM | POA: Diagnosis not present

## 2017-05-08 DIAGNOSIS — D62 Acute posthemorrhagic anemia: Secondary | ICD-10-CM | POA: Diagnosis not present

## 2017-05-08 DIAGNOSIS — I25110 Atherosclerotic heart disease of native coronary artery with unstable angina pectoris: Secondary | ICD-10-CM | POA: Diagnosis not present

## 2017-05-08 DIAGNOSIS — J9811 Atelectasis: Secondary | ICD-10-CM | POA: Diagnosis not present

## 2017-05-08 DIAGNOSIS — B351 Tinea unguium: Secondary | ICD-10-CM | POA: Diagnosis present

## 2017-05-08 DIAGNOSIS — I2510 Atherosclerotic heart disease of native coronary artery without angina pectoris: Secondary | ICD-10-CM

## 2017-05-08 DIAGNOSIS — Z01810 Encounter for preprocedural cardiovascular examination: Secondary | ICD-10-CM | POA: Diagnosis not present

## 2017-05-08 DIAGNOSIS — I10 Essential (primary) hypertension: Secondary | ICD-10-CM | POA: Diagnosis not present

## 2017-05-08 DIAGNOSIS — Z79899 Other long term (current) drug therapy: Secondary | ICD-10-CM

## 2017-05-08 DIAGNOSIS — E291 Testicular hypofunction: Secondary | ICD-10-CM | POA: Diagnosis present

## 2017-05-08 DIAGNOSIS — I2582 Chronic total occlusion of coronary artery: Secondary | ICD-10-CM | POA: Diagnosis not present

## 2017-05-08 DIAGNOSIS — I25810 Atherosclerosis of coronary artery bypass graft(s) without angina pectoris: Secondary | ICD-10-CM | POA: Diagnosis not present

## 2017-05-08 DIAGNOSIS — Z6831 Body mass index (BMI) 31.0-31.9, adult: Secondary | ICD-10-CM

## 2017-05-08 DIAGNOSIS — R079 Chest pain, unspecified: Secondary | ICD-10-CM | POA: Diagnosis not present

## 2017-05-08 DIAGNOSIS — Z4682 Encounter for fitting and adjustment of non-vascular catheter: Secondary | ICD-10-CM | POA: Diagnosis not present

## 2017-05-08 DIAGNOSIS — R9439 Abnormal result of other cardiovascular function study: Secondary | ICD-10-CM

## 2017-05-08 DIAGNOSIS — E785 Hyperlipidemia, unspecified: Secondary | ICD-10-CM | POA: Diagnosis not present

## 2017-05-08 DIAGNOSIS — Z951 Presence of aortocoronary bypass graft: Secondary | ICD-10-CM

## 2017-05-08 DIAGNOSIS — I071 Rheumatic tricuspid insufficiency: Secondary | ICD-10-CM | POA: Diagnosis not present

## 2017-05-08 HISTORY — DX: Atherosclerotic heart disease of native coronary artery without angina pectoris: I25.10

## 2017-05-08 HISTORY — PX: CARDIAC CATHETERIZATION: SHX172

## 2017-05-08 HISTORY — PX: LEFT HEART CATH AND CORONARY ANGIOGRAPHY: CATH118249

## 2017-05-08 LAB — PULMONARY FUNCTION TEST
FEF 25-75 Post: 2.77 L/sec
FEF 25-75 Pre: 2.46 L/sec
FEF2575-%Change-Post: 12 %
FEF2575-%Pred-Post: 97 %
FEF2575-%Pred-Pre: 86 %
FEV1-%Change-Post: 1 %
FEV1-%Pred-Post: 92 %
FEV1-%Pred-Pre: 91 %
FEV1-Post: 3.2 L
FEV1-Pre: 3.16 L
FEV1FVC-%Change-Post: 1 %
FEV1FVC-%Pred-Pre: 98 %
FEV6-%Change-Post: 1 %
FEV6-%Pred-Post: 97 %
FEV6-%Pred-Pre: 96 %
FEV6-Post: 4.25 L
FEV6-Pre: 4.2 L
FEV6FVC-%Change-Post: 0 %
FEV6FVC-%Pred-Post: 105 %
FEV6FVC-%Pred-Pre: 104 %
FVC-%Change-Post: 0 %
FVC-%Pred-Post: 92 %
FVC-%Pred-Pre: 92 %
FVC-Post: 4.25 L
FVC-Pre: 4.23 L
Post FEV1/FVC ratio: 75 %
Post FEV6/FVC ratio: 100 %
Pre FEV1/FVC ratio: 75 %
Pre FEV6/FVC Ratio: 99 %

## 2017-05-08 LAB — BLOOD GAS, ARTERIAL
Acid-Base Excess: 3 mmol/L — ABNORMAL HIGH (ref 0.0–2.0)
Bicarbonate: 26.9 mmol/L (ref 20.0–28.0)
Drawn by: 295031
FIO2: 21
O2 Saturation: 95.1 %
Patient temperature: 98.6
pCO2 arterial: 40.6 mmHg (ref 32.0–48.0)
pH, Arterial: 7.438 (ref 7.350–7.450)
pO2, Arterial: 70.9 mmHg — ABNORMAL LOW (ref 83.0–108.0)

## 2017-05-08 LAB — URINALYSIS, ROUTINE W REFLEX MICROSCOPIC
Bilirubin Urine: NEGATIVE
Glucose, UA: NEGATIVE mg/dL
Hgb urine dipstick: NEGATIVE
Ketones, ur: NEGATIVE mg/dL
Leukocytes, UA: NEGATIVE
Nitrite: NEGATIVE
Protein, ur: NEGATIVE mg/dL
Specific Gravity, Urine: 1.013 (ref 1.005–1.030)
pH: 7 (ref 5.0–8.0)

## 2017-05-08 LAB — BASIC METABOLIC PANEL
Anion gap: 6 (ref 5–15)
BUN: 10 mg/dL (ref 6–20)
CO2: 30 mmol/L (ref 22–32)
Calcium: 8.5 mg/dL — ABNORMAL LOW (ref 8.9–10.3)
Chloride: 101 mmol/L (ref 101–111)
Creatinine, Ser: 1.13 mg/dL (ref 0.61–1.24)
GFR calc Af Amer: >60 mL/min (ref >60)
GFR calc non Af Amer: >60 mL/min (ref >60)
Glucose, Bld: 132 mg/dL — ABNORMAL HIGH (ref 65–99)
Potassium: 3.1 mmol/L — ABNORMAL LOW (ref 3.5–5.1)
Sodium: 137 mmol/L (ref 135–145)

## 2017-05-08 LAB — CBC
HCT: 43.8 % (ref 39.0–52.0)
Hemoglobin: 14.7 g/dL (ref 13.0–17.0)
MCH: 28.5 pg (ref 26.0–34.0)
MCHC: 33.6 g/dL (ref 30.0–36.0)
MCV: 84.9 fL (ref 78.0–100.0)
Platelets: 240 10*3/uL (ref 150–400)
RBC: 5.16 MIL/uL (ref 4.22–5.81)
RDW: 13.6 % (ref 11.5–15.5)
WBC: 7.9 10*3/uL (ref 4.0–10.5)

## 2017-05-08 LAB — VAS US DOPPLER PRE CABG
LEFT ECA DIAS: -14 cm/s
LEFT VERTEBRAL DIAS: -8 cm/s
Left CCA dist dias: -26 cm/s
Left CCA dist sys: -121 cm/s
Left CCA prox dias: 23 cm/s
Left CCA prox sys: 139 cm/s
Left ICA dist dias: -29 cm/s
Left ICA dist sys: -87 cm/s
Left ICA prox dias: -19 cm/s
Left ICA prox sys: -89 cm/s
RIGHT ECA DIAS: -21 cm/s
RIGHT VERTEBRAL DIAS: 14 cm/s
Right CCA prox dias: -24 cm/s
Right CCA prox sys: -111 cm/s
Right cca dist sys: -110 cm/s

## 2017-05-08 LAB — ABO/RH: ABO/RH(D): A POS

## 2017-05-08 LAB — SURGICAL PCR SCREEN
MRSA, PCR: NEGATIVE
Staphylococcus aureus: POSITIVE — AB

## 2017-05-08 LAB — TYPE AND SCREEN
ABO/RH(D): A POS
Antibody Screen: NEGATIVE

## 2017-05-08 LAB — PROTIME-INR
INR: 1.07
Prothrombin Time: 14 seconds (ref 11.4–15.2)

## 2017-05-08 LAB — CREATININE, SERUM
Creatinine, Ser: 0.93 mg/dL (ref 0.61–1.24)
GFR calc Af Amer: >60 mL/min (ref >60)
GFR calc non Af Amer: >60 mL/min (ref >60)

## 2017-05-08 LAB — APTT: aPTT: 30 seconds (ref 24–36)

## 2017-05-08 SURGERY — LEFT HEART CATH AND CORONARY ANGIOGRAPHY
Anesthesia: LOCAL

## 2017-05-08 MED ORDER — SODIUM CHLORIDE 0.9 % IV SOLN
INTRAVENOUS | Status: DC
  Administered 2017-05-08: 1 mL via INTRAVENOUS

## 2017-05-08 MED ORDER — ASPIRIN 81 MG PO CHEW: 81.0000 mg | CHEWABLE_TABLET | Freq: Every day | ORAL | Status: DC

## 2017-05-08 MED ORDER — PLASMA-LYTE 148 IV SOLN
INTRAVENOUS | Status: DC
  Filled 2017-05-08 (×2): qty 2.5

## 2017-05-08 MED ORDER — SODIUM CHLORIDE 0.9 % IV SOLN
1500.0000 mg | INTRAVENOUS | Status: DC
  Filled 2017-05-08 (×2): qty 1500

## 2017-05-08 MED ORDER — SODIUM CHLORIDE 0.9 % WEIGHT BASED INFUSION
3.0000 mL/kg/h | INTRAVENOUS | Status: DC
  Administered 2017-05-08: 3 mL/kg/h via INTRAVENOUS

## 2017-05-08 MED ORDER — IOPAMIDOL (ISOVUE-370) INJECTION 76%
INTRAVENOUS | Status: DC | PRN
  Administered 2017-05-08: 90 mL via INTRA_ARTERIAL

## 2017-05-08 MED ORDER — FENTANYL CITRATE (PF) 100 MCG/2ML IJ SOLN
INTRAMUSCULAR | Status: DC | PRN
  Administered 2017-05-08: 50 ug via INTRAVENOUS

## 2017-05-08 MED ORDER — VERAPAMIL HCL 2.5 MG/ML IV SOLN
INTRAVENOUS | Status: AC
  Filled 2017-05-08: qty 2

## 2017-05-08 MED ORDER — SODIUM CHLORIDE 0.9% FLUSH: 3.0000 mL | Freq: Two times a day (BID) | INTRAVENOUS | Status: DC

## 2017-05-08 MED ORDER — EPINEPHRINE PF 1 MG/ML IJ SOLN
0.0000 ug/min | INTRAVENOUS | Status: DC
  Filled 2017-05-08 (×2): qty 4

## 2017-05-08 MED ORDER — NITROGLYCERIN IN D5W 200-5 MCG/ML-% IV SOLN
2.0000 ug/min | INTRAVENOUS | Status: DC
  Filled 2017-05-08 (×2): qty 250

## 2017-05-08 MED ORDER — TEMAZEPAM 15 MG PO CAPS: 15.0000 mg | ORAL_CAPSULE | Freq: Once | ORAL | Status: DC | PRN

## 2017-05-08 MED ORDER — ALPRAZOLAM 0.25 MG PO TABS
0.2500 mg | ORAL_TABLET | ORAL | Status: DC | PRN
Start: 2017-05-08 — End: 2017-05-09
  Administered 2017-05-08 – 2017-05-09 (×2): 0.25 mg via ORAL
  Filled 2017-05-08 (×2): qty 1

## 2017-05-08 MED ORDER — HEPARIN SODIUM (PORCINE) 1000 UNIT/ML IJ SOLN
INTRAMUSCULAR | Status: DC | PRN
  Administered 2017-05-08: 5000 [IU] via INTRAVENOUS

## 2017-05-08 MED ORDER — BISACODYL 5 MG PO TBEC
5.0000 mg | DELAYED_RELEASE_TABLET | Freq: Once | ORAL | Status: AC
  Administered 2017-05-08: 5 mg via ORAL
  Filled 2017-05-08: qty 1

## 2017-05-08 MED ORDER — SODIUM CHLORIDE 0.9 % IV SOLN
30.0000 ug/min | INTRAVENOUS | Status: DC
  Filled 2017-05-08 (×2): qty 2

## 2017-05-08 MED ORDER — METOPROLOL TARTRATE 25 MG PO TABS
25.0000 mg | ORAL_TABLET | Freq: Two times a day (BID) | ORAL | Status: DC
  Administered 2017-05-08 (×2): 25 mg via ORAL
  Filled 2017-05-08 (×2): qty 1

## 2017-05-08 MED ORDER — ISOSORBIDE MONONITRATE ER 30 MG PO TB24
30.0000 mg | ORAL_TABLET | Freq: Every day | ORAL | Status: DC
  Administered 2017-05-08: 30 mg via ORAL
  Filled 2017-05-08: qty 1

## 2017-05-08 MED ORDER — MAGNESIUM SULFATE 50 % IJ SOLN
40.0000 meq | INTRAMUSCULAR | Status: DC
  Filled 2017-05-08 (×2): qty 10

## 2017-05-08 MED ORDER — SODIUM CHLORIDE 0.9 % IV SOLN: 250.0000 mL | INTRAVENOUS | Status: DC | PRN

## 2017-05-08 MED ORDER — SODIUM CHLORIDE 0.9 % IV SOLN
INTRAVENOUS | Status: DC
  Filled 2017-05-08 (×2): qty 30

## 2017-05-08 MED ORDER — DEXMEDETOMIDINE HCL IN NACL 400 MCG/100ML IV SOLN
0.1000 ug/kg/h | INTRAVENOUS | Status: DC
  Filled 2017-05-08 (×2): qty 100

## 2017-05-08 MED ORDER — MIDAZOLAM HCL 2 MG/2ML IJ SOLN
INTRAMUSCULAR | Status: AC
Start: 2017-05-08 — End: 2017-05-08
  Filled 2017-05-08: qty 2

## 2017-05-08 MED ORDER — CHLORHEXIDINE GLUCONATE 0.12 % MT SOLN
15.0000 mL | Freq: Once | OROMUCOSAL | Status: AC
  Administered 2017-05-09: 15 mL via OROMUCOSAL
  Filled 2017-05-08: qty 15

## 2017-05-08 MED ORDER — CHLORHEXIDINE GLUCONATE CLOTH 2 % EX PADS
6.0000 | MEDICATED_PAD | Freq: Once | CUTANEOUS | Status: AC
  Administered 2017-05-09: 6 via TOPICAL

## 2017-05-08 MED ORDER — TRANEXAMIC ACID (OHS) BOLUS VIA INFUSION
15.0000 mg/kg | INTRAVENOUS | Status: AC
  Administered 2017-05-09: 1483.5 mg via INTRAVENOUS
  Filled 2017-05-08: qty 1484

## 2017-05-08 MED ORDER — IOPAMIDOL (ISOVUE-370) INJECTION 76%
INTRAVENOUS | Status: AC
  Filled 2017-05-08: qty 100

## 2017-05-08 MED ORDER — ENOXAPARIN SODIUM 40 MG/0.4ML ~~LOC~~ SOLN: 40.0000 mg | SUBCUTANEOUS | Status: DC

## 2017-05-08 MED ORDER — ALBUTEROL SULFATE (2.5 MG/3ML) 0.083% IN NEBU
2.5000 mg | INHALATION_SOLUTION | Freq: Once | RESPIRATORY_TRACT | Status: AC
  Administered 2017-05-08: 2.5 mg via RESPIRATORY_TRACT

## 2017-05-08 MED ORDER — ONDANSETRON HCL 4 MG/2ML IJ SOLN: 4.0000 mg | Freq: Four times a day (QID) | INTRAMUSCULAR | Status: DC | PRN

## 2017-05-08 MED ORDER — DIAZEPAM 5 MG PO TABS
5.0000 mg | ORAL_TABLET | Freq: Once | ORAL | Status: AC
  Administered 2017-05-09: 5 mg via ORAL
  Filled 2017-05-08: qty 1

## 2017-05-08 MED ORDER — ACETAMINOPHEN 325 MG PO TABS: 650.0000 mg | ORAL_TABLET | ORAL | Status: DC | PRN

## 2017-05-08 MED ORDER — NITROGLYCERIN 0.4 MG SL SUBL: 0.4000 mg | SUBLINGUAL_TABLET | SUBLINGUAL | Status: DC | PRN

## 2017-05-08 MED ORDER — DEXTROSE 5 % IV SOLN
750.0000 mg | INTRAVENOUS | Status: DC
  Filled 2017-05-08 (×2): qty 750

## 2017-05-08 MED ORDER — FENTANYL CITRATE (PF) 100 MCG/2ML IJ SOLN
INTRAMUSCULAR | Status: AC
  Filled 2017-05-08: qty 2

## 2017-05-08 MED ORDER — ATORVASTATIN CALCIUM 80 MG PO TABS
80.0000 mg | ORAL_TABLET | Freq: Every day | ORAL | Status: DC
  Administered 2017-05-08: 80 mg via ORAL
  Filled 2017-05-08: qty 1

## 2017-05-08 MED ORDER — POTASSIUM CHLORIDE 2 MEQ/ML IV SOLN
80.0000 meq | INTRAVENOUS | Status: DC
  Filled 2017-05-08 (×2): qty 40

## 2017-05-08 MED ORDER — DIAZEPAM 5 MG PO TABS: 5.0000 mg | ORAL_TABLET | Freq: Four times a day (QID) | ORAL | Status: DC | PRN

## 2017-05-08 MED ORDER — MIDAZOLAM HCL 2 MG/2ML IJ SOLN
INTRAMUSCULAR | Status: DC | PRN
  Administered 2017-05-08: 2 mg via INTRAVENOUS

## 2017-05-08 MED ORDER — LIDOCAINE HCL 1 % IJ SOLN
INTRAMUSCULAR | Status: AC
  Filled 2017-05-08: qty 20

## 2017-05-08 MED ORDER — TRANEXAMIC ACID 1000 MG/10ML IV SOLN
1.5000 mg/kg/h | INTRAVENOUS | Status: DC
  Administered 2017-05-09: 1.5 mg/kg/h via INTRAVENOUS
  Filled 2017-05-08 (×2): qty 25

## 2017-05-08 MED ORDER — ASPIRIN 81 MG PO CHEW: 81.0000 mg | CHEWABLE_TABLET | ORAL | Status: DC

## 2017-05-08 MED ORDER — DOPAMINE-DEXTROSE 3.2-5 MG/ML-% IV SOLN
0.0000 ug/kg/min | INTRAVENOUS | Status: DC
  Filled 2017-05-08 (×2): qty 250

## 2017-05-08 MED ORDER — SODIUM CHLORIDE 0.9% FLUSH: 3.0000 mL | INTRAVENOUS | Status: DC | PRN

## 2017-05-08 MED ORDER — HEPARIN (PORCINE) IN NACL 2-0.9 UNIT/ML-% IJ SOLN
INTRAMUSCULAR | Status: AC | PRN
  Administered 2017-05-08: 1000 mL

## 2017-05-08 MED ORDER — METOPROLOL TARTRATE 12.5 MG HALF TABLET
12.5000 mg | ORAL_TABLET | Freq: Once | ORAL | Status: AC
  Administered 2017-05-09: 12.5 mg via ORAL
  Filled 2017-05-08: qty 1

## 2017-05-08 MED ORDER — SODIUM CHLORIDE 0.9 % WEIGHT BASED INFUSION: 1.0000 mL/kg/h | INTRAVENOUS | Status: DC

## 2017-05-08 MED ORDER — DEXTROSE 5 % IV SOLN
1.5000 g | INTRAVENOUS | Status: DC
  Filled 2017-05-08 (×2): qty 1.5

## 2017-05-08 MED ORDER — HEPARIN (PORCINE) IN NACL 2-0.9 UNIT/ML-% IJ SOLN
INTRAMUSCULAR | Status: AC
  Filled 2017-05-08: qty 1000

## 2017-05-08 MED ORDER — TRANEXAMIC ACID (OHS) PUMP PRIME SOLUTION
2.0000 mg/kg | INTRAVENOUS | Status: DC
  Filled 2017-05-08 (×2): qty 1.98

## 2017-05-08 MED ORDER — SODIUM CHLORIDE 0.9 % IV SOLN
INTRAVENOUS | Status: DC
  Filled 2017-05-08 (×2): qty 1

## 2017-05-08 MED ORDER — HEPARIN (PORCINE) IN NACL 2-0.9 UNIT/ML-% IJ SOLN
INTRAMUSCULAR | Status: DC | PRN
  Administered 2017-05-08: 10 mL via INTRA_ARTERIAL

## 2017-05-08 MED ORDER — CHLORHEXIDINE GLUCONATE CLOTH 2 % EX PADS
6.0000 | MEDICATED_PAD | Freq: Once | CUTANEOUS | Status: AC
  Administered 2017-05-08: 6 via TOPICAL

## 2017-05-08 MED ORDER — HEPARIN SODIUM (PORCINE) 1000 UNIT/ML IJ SOLN
INTRAMUSCULAR | Status: AC
  Filled 2017-05-08: qty 1

## 2017-05-08 SURGICAL SUPPLY — 11 items

## 2017-05-08 NOTE — Progress Notes (Signed)
Pre-op Cardiac Surgery  Carotid Findings:  No significant stenosis noted. Vertebral artery flow is antegrade.   Upper Extremity Right Left  Brachial Pressures 149T 153T  Radial Waveforms T T  Ulnar Waveforms T T  Palmar Arch (Allen's Test) WNL WNL   Findings:  WNL    Lower  Extremity Right Left  Dorsalis Pedis    Anterior Tibial T T  Posterior Tibial T T  Ankle/Brachial Indices      Findings:  WNL

## 2017-05-08 NOTE — H&P (View-Only) (Signed)
Cardiology Office Note    Date:  05/01/2017  ID:  Dominic Ray, DOB 11-Feb-1956, MRN [id number] PCP:  Silverio Decamp, MD  Cardiologist:  [name]   Chief Complaint: discuss abormal stress test  History of Present Illness:  Dominic Ray is a 61 y.o. male with history of HTN, HLD, family history of CAD who recently saw 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 2D Echo 04/12/17 showed mild LVH, EF 60-65%, no RWMA, normal diastolic parameters, mild LAE. ETT 04/25/17 was abnormal with 2 mm ST depression in inferior leads and lead 1 beginning at peak exercise. He was referred back to clinic to discuss cath. No recent labs on file for review - last LDL 142 and Cr 1.07 in 2014.  [name] recommended he return to clinic today to arrange cardiac cath. He returns for followup with his wife today. No recurrent chest pain. No dyspnea. No syncope. Denies prior h/o TIA, stroke or bleeding. Has not had cholesterol checked in a while. Has lots of questions about procedure and possible outcomes.   Past Medical History:  Diagnosis Date  . Basal cell carcinoma of nasal tip 08/24/2012  . Hyperlipidemia 09/05/2012  . Hypertension 08/24/2012  . Male hypogonadism 08/24/2012  . Onychomycosis 08/24/2012  . Preventive measure 09/05/2012    Past Surgical History:  Procedure Laterality Date  . TONSILLECTOMY  1968    Current Medications: Current Outpatient Prescriptions  Medication Sig Dispense Refill  . aspirin EC 81 MG tablet Take 1 tablet (81 mg total) by mouth daily. 90 tablet 3  . lisinopril-hydrochlorothiazide (PRINZIDE,ZESTORETIC) 20-25 MG tablet TAKE 1 TABLET BY MOUTH EVERY DAY 90 tablet 3   No current facility-administered medications for this visit.      Allergies:   [name] [lidocaine]   Social History   Social History  . Marital status: Married    Spouse name: N/A  . Number of children: N/A    . Years of education: N/A   Occupational History  .      Unemployed   Social History Main Topics  . Smoking status: Never Smoker  . Smokeless tobacco: Never Used  . Alcohol use Yes     Comment: Occasional  . Drug use: No  . Sexual activity: Not Asked   Other Topics Concern  . None   Social History Narrative  . None     Family History:  Family History  Problem Relation Age of Onset  . CAD Father        Died of MI at age 73  . Cancer Mother   . Colon cancer Neg Hx   . Stomach cancer Neg Hx     ROS:   Please see the history of present illness. All other systems are reviewed and otherwise negative.    PHYSICAL EXAM:   VS:  BP 112/70 (BP Location: Left Arm)   Pulse 65   Ht 5\' 9"  (1.753 m)   Wt 220 lb 9.6 oz (100.1 kg)   BMI 32.58 kg/m   BMI: Body mass index is 32.58 kg/m. GEN: Well nourished, well developed WM, in no acute distress  HEENT: normocephalic, atraumatic Neck: no JVD, carotid bruits, or masses Cardiac: RRR; no murmurs, rubs, or gallops, no edema  Respiratory:  clear to auscultation bilaterally, normal work of breathing GI: soft, nontender, nondistended, + BS MS: no deformity or atrophy  Skin: warm and dry, no  rash Neuro:  Alert and Oriented x 3, Strength and sensation are intact, follows commands Psych: euthymic mood, full affect  Wt Readings from Last 3 Encounters:  05/01/17 220 lb 9.6 oz (100.1 kg)  04/05/17 216 lb 6.4 oz (98.2 kg)  03/16/17 221 lb (100.2 kg)      Studies/Labs Reviewed:   EKG:  EKG was ordered today and personally reviewed by me and demonstrates NSR 65bpm, TWI III, no acute ST-T changes.  Recent Labs: No results found for requested labs within last 8760 hours.   Lipid Panel    Component Value Date/Time   CHOL 218 (H) 12/06/2012 0857   TRIG 179 (H) 12/06/2012 0857   HDL 40 12/06/2012 0857   CHOLHDL 5.5 12/06/2012 0857   VLDL 36 12/06/2012 0857   LDLCALC 142 (H) 12/06/2012 0857    Additional studies/ records that  were reviewed today include: Summarized above.    ASSESSMENT & PLAN:   1. Abnormal stress test/recent angina pectoris - Cath recommended by [name]. Discussed in detail with patient today. Long discussion about potential outcomes. Risks and benefits of cardiac catheterization have been discussed with the patient. These include bleeding, infection, kidney damage, stroke, heart attack, death. The patient understands these risks and is willing to proceed. He wishes to schedule this early next week. Discussed risk of delaying procedure; he verbalized understanding. Continue ASA. Add SL NTG PRN. Discussed use. Warning sx/ER precautions reviewed. Check pre-cath labs. Addendum: reviewed above with [name] with regard to whether or not to add antianginal prior to cath; he wishes to proceed with cath before addition of any new agents. 2. Essential HTN - controlled. 3. Hyperlipidemia - check CMET/lipids today. Plan to add statin if appropriate.  Disposition: F/u with Dr. Melynda Ripple team APP in 3 weeks.  Medication Adjustments/Labs and Tests Ordered: Current medicines are reviewed at length with the patient today.  Concerns regarding medicines are outlined above. Medication changes, Labs and Tests ordered today are summarized above and listed in the Patient Instructions accessible in Encounters.   Signed, Charlie Pitter, PA-C  05/01/2017 11:36 AM    [facility] [place], [place], [place]  [address] Phone: [phone]; Fax: [phone]

## 2017-05-08 NOTE — Anesthesia Preprocedure Evaluation (Addendum)
Anesthesia Evaluation  Patient identified by MRN, date of birth, ID band Patient awake    Reviewed: Allergy & Precautions, NPO status , Patient's Chart, lab work & pertinent test results  Airway Mallampati: II  TM Distance: >3 FB Neck ROM: Full    Dental no notable dental hx.    Pulmonary neg pulmonary ROS,    Pulmonary exam normal breath sounds clear to auscultation       Cardiovascular hypertension, + CAD  Normal cardiovascular exam Rhythm:Regular Rate:Normal  ECG: NSR, rate 65  ECHO: Left ventricle: The cavity size was mildly dilated. Wall thickness was increased in a pattern of mild LVH. Systolic function was normal. The estimated ejection fraction was in the range of 60% to 65%. Wall motion was normal; there were no regional wall motion abnormalities. Left ventricular diastolic function parameters were normal. - Left atrium: The atrium was mildly dilated. - Atrial septum: No defect or patent foramen ovale was identified.  CATH:   Ost LM-1 lesion, 30 %stenosed.  Ost LM-2 lesion, 30 %stenosed.  1st Diag lesion, 70 %stenosed.  Mid LAD lesion, 90 %stenosed.  Prox RCA lesion, 90 %stenosed.  Mid Cx lesion, 100 %stenosed.  Mid RCA lesion, 100 %stenosed.  The left ventricular systolic function is normal.  The left ventricular ejection fraction is 50-55% by visual estimate.   Neuro/Psych negative neurological ROS  negative psych ROS   GI/Hepatic negative GI ROS, Neg liver ROS,   Endo/Other  negative endocrine ROS  Renal/GU negative Renal ROS  negative genitourinary   Musculoskeletal negative musculoskeletal ROS (+)   Abdominal (+) + obese,   Peds negative pediatric ROS (+)  Hematology negative hematology ROS (+)   Anesthesia Other Findings Obese Hyperlipidemia   Reproductive/Obstetrics negative OB ROS                            Anesthesia Physical Anesthesia Plan  ASA:  IV  Anesthesia Plan: General   Post-op Pain Management:    Induction: Intravenous  PONV Risk Score and Plan: 2 and Ondansetron, Dexamethasone, Propofol and Midazolam  Airway Management Planned: Oral ETT  Additional Equipment: Arterial line, CVP, PA Cath, TEE and Ultrasound Guidance Line Placement  Intra-op Plan:   Post-operative Plan: Post-operative intubation/ventilation  Informed Consent: I have reviewed the patients History and Physical, chart, labs and discussed the procedure including the risks, benefits and alternatives for the proposed anesthesia with the patient or authorized representative who has indicated his/her understanding and acceptance.   Dental advisory given  Plan Discussed with: CRNA  Anesthesia Plan Comments: (Most recent lab results reviewed)        Anesthesia Quick Evaluation

## 2017-05-08 NOTE — Consult Note (Signed)
[address]       [place] [address]             [phone]      Cardiothoracic Surgery Consultation  Reason for Consult: Severe multi-vessel coronary artery disease Referring Physician: Dr. Shelva Majestic  Dominic Ray is an 61 y.o. male.  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 They resolved quickly with rest. He saw [name] and had an echo that was normal. An ETT on 04/25/2017 showed abnormal 2 mm ST depression in the inferior leads and lead 1 with peak exercise. He had a cardiac cath today that shows a high grade proximal LAD stenosis after D1. The LCX system has two moderate caliber OM branches and then the small distal branch is occluded. The RCA has a large AM branch and then is occluded with the AM supplying collaterals to the PDA. LV function is preserved.   Past Medical History:  Diagnosis Date  . Basal cell carcinoma of nasal tip 08/24/2012  . Hyperlipidemia 09/05/2012  . Hypertension 08/24/2012  . Male hypogonadism 08/24/2012  . Onychomycosis 08/24/2012  . Preventive measure 09/05/2012    Past Surgical History:  Procedure Laterality Date  . TONSILLECTOMY  1968    Family History  Problem Relation Age of Onset  . CAD Father        Died of MI at age 64  . Cancer Mother   . Colon cancer Neg Hx   . Stomach cancer Neg Hx     Social History:  reports that he has never smoked. He has never used smokeless tobacco. He reports that he drinks alcohol. He reports that he does not use drugs.  Allergies:  Allergies  Allergen Reactions  . [name] [Lidocaine] Rash    Anything ending in (-caine) per patient     Medications:  I have reviewed the patient's current medications. Prior to Admission:  Prescriptions  Prior to Admission  Medication Sig Dispense Refill Last Dose  . atorvastatin (LIPITOR) 40 MG tablet Take 1 tablet (40 mg total) by mouth daily. (Patient taking differently: Take 40 mg by mouth daily at 8 pm. ) 90 tablet 3 05/07/2017 at Unknown time  . naproxen sodium (ANAPROX) 220 MG tablet Take 220 mg by mouth 2 (two) times daily as needed (for headaches.).   05/07/2017 at Unknown time  . nitroGLYCERIN (NITROSTAT) 0.4 MG SL tablet Place 1 tablet (0.4 mg total) under the tongue every 5 (five) minutes as needed for chest pain. 25 tablet 3 never  . [DISCONTINUED] aspirin EC 81 MG tablet Take 1 tablet (81 mg total) by mouth daily. (Patient taking differently: Take 81 mg by mouth daily at 8 pm. ) 90 tablet 3 05/08/2017 at 0615  . [DISCONTINUED] lisinopril-hydrochlorothiazide (PRINZIDE,ZESTORETIC) 20-25 MG tablet TAKE 1 TABLET BY MOUTH EVERY DAY (Patient taking differently: TAKE 1 TABLET BY MOUTH EVERY DAY AT 8 PM) 90 tablet 3 05/07/2017 at Unknown time   Scheduled: . [START ON 05/09/2017] aspirin  81 mg Oral Daily  . atorvastatin  80 mg Oral q1800  . [START ON 05/09/2017] enoxaparin (LOVENOX) injection  40 mg Subcutaneous Q24H  . isosorbide mononitrate  30 mg Oral Daily  . metoprolol tartrate  25 mg Oral  BID  . sodium chloride flush  3 mL Intravenous Q12H   Continuous: . sodium chloride 1 mL (05/08/17 1325)  . sodium chloride     WGN:FAOZHY chloride, acetaminophen, diazepam, nitroGLYCERIN, ondansetron (ZOFRAN) IV, sodium chloride flush Anti-infectives    None      Results for orders placed or performed during the hospital encounter of 05/08/17 (from the past 48 hour(s))  CBC     Status: None   Collection Time: 05/08/17 10:39 AM  Result Value Ref Range   WBC 7.9 4.0 - 10.5 K/uL   RBC 5.16 4.22 - 5.81 MIL/uL   Hemoglobin 14.7 13.0 - 17.0 g/dL   HCT 43.8 39.0 - 52.0 %   MCV 84.9 78.0 - 100.0 fL   MCH 28.5 26.0 - 34.0 pg   MCHC 33.6 30.0 - 36.0 g/dL   RDW 13.6 11.5 - 15.5 %   Platelets 240  150 - 400 K/uL  Creatinine, serum     Status: None   Collection Time: 05/08/17 10:39 AM  Result Value Ref Range   Creatinine, Ser 0.93 0.61 - 1.24 mg/dL   GFR calc non Af Amer >60 >60 mL/min   GFR calc Af Amer >60 >60 mL/min    Comment: (NOTE) The eGFR has been calculated using the CKD EPI equation. This calculation has not been validated in all clinical situations. eGFR's persistently <60 mL/min signify possible Chronic Kidney Disease.     No results found.  Review of Systems  Constitutional: Negative for chills, diaphoresis, fever, malaise/fatigue and weight loss.  HENT: Negative.   Eyes: Negative.   Respiratory: Negative for shortness of breath.   Cardiovascular: Positive for chest pain. Negative for palpitations, orthopnea, leg swelling and PND.  Gastrointestinal: Negative.   Genitourinary: Negative.   Musculoskeletal: Negative.   Skin: Negative.   Neurological: Negative.   Endo/Heme/Allergies: Negative.   Psychiatric/Behavioral: Negative.    Blood pressure 132/85, pulse 89, temperature 98.3 F (36.8 C), temperature source Oral, resp. rate 20, height '5\' 9"'  (1.753 m), weight 98.9 kg (218 lb), SpO2 96 %. Physical Exam  Constitutional: He is oriented to person, place, and time. He appears well-developed and well-nourished. No distress.  HENT:  Head: Normocephalic and atraumatic.  Mouth/Throat: Oropharynx is clear and moist.  Eyes: EOM are normal. Pupils are equal, round, and reactive to light.  Neck: Normal range of motion. Neck supple. No JVD present. No thyromegaly present.  Cardiovascular: Normal rate, regular rhythm, normal heart sounds and intact distal pulses.   No murmur heard. Respiratory: Effort normal and breath sounds normal. No respiratory distress.  GI: Soft. Bowel sounds are normal. He exhibits no distension. There is no tenderness.  Musculoskeletal: Normal range of motion. He exhibits no edema.  Lymphadenopathy:    He has no cervical adenopathy.    Neurological: He is alert and oriented to person, place, and time. He has normal strength. No cranial nerve deficit or sensory deficit.  Skin: Skin is warm and dry.  Psychiatric: He has a normal mood and affect.                [facility]*                       [address] [place], [place] [address]  [phone]  ------------------------------------------------------------------- Transthoracic Echocardiography  Patient:    Dominic Ray, Dominic Ray MR #:       [id number] Study Date: 04/12/2017 Gender:     M Age:        27 Height:     172.7 cm Weight:     98.2 kg BSA:        2.2 m^2 Pt. Status: Room:   ATTENDING    Kirk Ruths  [facility]  REFERRING    Kirk Ruths  SONOGRAPHER  Donata Clay  PERFORMING   [facility], Outpatient  cc:  ------------------------------------------------------------------- LV EF: 60% -   65%  ------------------------------------------------------------------- Indications:      Precordial pain 786.51.  ------------------------------------------------------------------- History:   Risk factors:  Hypertension. Dyslipidemia.  ------------------------------------------------------------------- Study Conclusions  - Left ventricle: The cavity size was mildly dilated. Wall   thickness was increased in a pattern of mild LVH. Systolic   function was normal. The estimated ejection fraction was in the   range of 60% to 65%. Wall motion was normal; there were no   regional wall motion abnormalities. Left ventricular diastolic   function parameters were normal. - Left atrium: The atrium was mildly dilated. - Atrial septum: No defect or patent foramen ovale was identified.  ------------------------------------------------------------------- Study data:  No prior study was available for comparison.  Study status:  Routine.  Procedure:  The patient reported no pain pre  or post test. Transthoracic echocardiography. Image quality was adequate.  Study completion:  There were no complications. Transthoracic echocardiography.  M-mode, complete 2D, spectral Doppler, and color Doppler.  Birthdate:  Patient birthdate: November 27, 1955.  Age:  Patient is 61 yr old.  Sex:  Gender: male. BMI: 32.9 kg/m^2.  Blood pressure:     108/70  Patient status: Outpatient.  Study date:  Study date: 04/12/2017. Study time: 11:32 AM.  Location:  Echo laboratory.  -------------------------------------------------------------------  ------------------------------------------------------------------- Left ventricle:  The cavity size was mildly dilated. Wall thickness was increased in a pattern of mild LVH. Systolic function was normal. The estimated ejection fraction was in the range of 60% to 65%. Wall motion was normal; there were no regional wall motion abnormalities. The transmitral flow pattern was normal. The deceleration time of the early transmitral flow velocity was normal. The pulmonary vein flow pattern was normal. The tissue Doppler parameters were normal. Left ventricular diastolic function parameters were normal.  ------------------------------------------------------------------- Aortic valve:   Trileaflet; normal thickness leaflets. Mobility was not restricted. Sclerosis without stenosis.  Doppler: Transvalvular velocity was within the normal range. There was no stenosis. There was no regurgitation.  ------------------------------------------------------------------- Aorta:  The aorta was normal, not dilated, and non-diseased. Aortic root: The aortic root was normal in size.  ------------------------------------------------------------------- Mitral valve:   Mildly thickened leaflets . Mobility was not restricted.  Doppler:  Transvalvular velocity was within the normal range. There was no evidence for stenosis. There was no significant regurgitation.     Peak gradient (D): 4 mm Hg.  ------------------------------------------------------------------- Left atrium:  The atrium was mildly dilated.  ------------------------------------------------------------------- Atrial septum:  No defect or patent foramen ovale was identified.   ------------------------------------------------------------------- Right ventricle:  The cavity size was normal. Wall thickness was normal. Systolic function was normal.  ------------------------------------------------------------------- Pulmonic valve:    Doppler:  Transvalvular velocity was within the normal range. There was no evidence for stenosis. There was trivial regurgitation.  ------------------------------------------------------------------- Tricuspid valve:   Structurally normal valve.    Doppler: Transvalvular velocity was within the normal range. There  was mild regurgitation.  ------------------------------------------------------------------- Pulmonary artery:   The main pulmonary artery was normal-sized. Systolic pressure was within the normal range.  ------------------------------------------------------------------- Right atrium:  The atrium was normal in size.  ------------------------------------------------------------------- Pericardium:  There was no pericardial effusion.  ------------------------------------------------------------------- Systemic veins: Inferior vena cava: The vessel was normal in size. The respirophasic diameter changes were in the normal range (>= 50%), consistent with normal central venous pressure.  ------------------------------------------------------------------- Post procedure conclusions Ascending Aorta:  - The aorta was normal, not dilated, and non-diseased.  ------------------------------------------------------------------- Measurements   Left ventricle                          Value        Reference  LV ID, ED, PLAX chordal          (L)     38.2  mm     43 - 52  LV ID, ES, PLAX chordal                 27.4  mm     23 - 38  LV fx shortening, PLAX chordal  (L)     28    %      >=29  LV PW thickness, ED                     9.83  mm     ----------  IVS/LV PW ratio, ED                     1.21         <=1.3  Stroke volume, 2D                       93    ml     ----------  Stroke volume/bsa, 2D                   42    ml/m^2 ----------  LV e&', lateral                          14.4  cm/s   ----------  LV E/e&', lateral                        6.9          ----------  LV e&', medial                           7.63  cm/s   ----------  LV E/e&', medial                         13.03        ----------  LV e&', average                          11.02 cm/s   ----------  LV E/e&', average                        9.02         ----------    Ventricular septum                      Value        Reference  IVS thickness, ED                       11.9  mm     ----------    LVOT                                    Value        Reference  LVOT ID, S                              20    mm     ----------  LVOT area                               3.14  cm^2   ----------  LVOT peak velocity, S                   148   cm/s   ----------  LVOT mean velocity, S                   104   cm/s   ----------  LVOT VTI, S                             29.7  cm     ----------  LVOT peak gradient, S                   9     mm Hg  ----------    Aorta                                   Value        Reference  Aortic root ID, ED                      31    mm     ----------    Left atrium                             Value        Reference  LA ID, A-P, ES                          38    mm     ----------  LA ID/bsa, A-P                          1.72  cm/m^2 <=2.2  LA volume, S                            61.7  ml     ----------  LA volume/bsa, S                        28    ml/m^2 ----------  LA volume, ES, 1-p A4C                  51.5  ml     ----------  LA  volume/bsa,  ES, 1-p A4C              23.4  ml/m^2 ----------  LA volume, ES, 1-p A2C                  69.4  ml     ----------  LA volume/bsa, ES, 1-p A2C              31.5  ml/m^2 ----------    Mitral valve                            Value        Reference  Mitral E-wave peak velocity             99.4  cm/s   ----------  Mitral A-wave peak velocity             110   cm/s   ----------  Mitral deceleration time        (H)     282   ms     150 - 230  Mitral peak gradient, D                 4     mm Hg  ----------  Mitral E/A ratio, peak                  0.9          ----------    Right atrium                            Value        Reference  RA ID, S-I, ES, A4C                     47.8  mm     34 - 49  RA area, ES, A4C                        16.6  cm^2   8.3 - 19.5  RA volume, ES, A/L                      42.6  ml     ----------  RA volume/bsa, ES, A/L                  19.3  ml/m^2 ----------    Systemic veins                          Value        Reference  Estimated CVP                           3     mm Hg  ----------    Right ventricle                         Value        Reference  RV ID, minor axis, ED, A4C base         39.3  mm     ----------  TAPSE  32.1  mm     ----------  RV s&', lateral, S                       15.5  cm/s   ----------  Legend: (L)  and  (H)  mark values outside specified reference range.  ------------------------------------------------------------------- Prepared and Electronically Authenticated by  Jenkins Rouge, M.D. 2018-05-30T13:47:36   Assessment/Plan:  This 61 year old gentleman has severe multi-vessel coronary artery disease with an abnormal ETT and recent onset of intermittent exertional chest pressure. I have personally reviewed his cath and echo studies. He has a high grade proximal LAD stenosis and occlusion of the proximal to mid RCA with collateral to the PDA from a large AM. His LV function is normal. I  agree that CABG is the best treatment to resolve his symptoms and prevent MI in this active young patient. This will need to be followed by maximum cardiac risk factor reduction. I discussed the operative procedure with the patient and family including alternatives, benefits and risks; including but not limited to bleeding, blood transfusion, infection, stroke, myocardial infarction, graft failure, heart block requiring a permanent pacemaker, organ dysfunction, and death.  [facility] understands and agrees to proceed.  We will schedule surgery for tomorrow morning.  I spent 60 minutes performing this consultation and > 50% of this time was spent face to face counseling and coordinating the care of this patient's severe multi-vessel coronary artery disease.   Gaye Pollack 05/08/2017, 1:59 PM

## 2017-05-08 NOTE — Progress Notes (Signed)
[id number] Assisted pt to bathroom and back to bed. Brief ed done as respiratory waiting to do PFTS. Gave pt OHS booklet and careguide. Discussed how to view pre op video. Discussed not using arms to get up and down. Discussed importance of IS and mobility after surgery. Pt stated he will someone available after discharge to stay with him. Will follow up after surgery. Graylon Good RN BSN 05/08/2017 2:12 PM

## 2017-05-08 NOTE — Interval H&P Note (Signed)
Cath Lab Visit (complete for each Cath Lab visit)  Clinical Evaluation Leading to the Procedure:   ACS: No.  Non-ACS:    Anginal Classification: CCS II  Anti-ischemic medical therapy: Minimal Therapy (1 class of medications)  Non-Invasive Test Results: Intermediate-risk stress test findings: cardiac mortality 1-3%/year  Prior CABG: No previous CABG      History and Physical Interval Note:  05/08/2017 7:42 AM  Dominic Ray  has presented today for surgery, with the diagnosis of abnormal stress  The various methods of treatment have been discussed with the patient and family. After consideration of risks, benefits and other options for treatment, the patient has consented to  Procedure(s): Left Heart Cath and Coronary Angiography (N/A) as a surgical intervention .  The patient's history has been reviewed, patient examined, no change in status, stable for surgery.  I have reviewed the patient's chart and labs.  Questions were answered to the patient's satisfaction.     Shelva Majestic

## 2017-05-09 ENCOUNTER — Inpatient Hospital Stay (HOSPITAL_COMMUNITY): Payer: BLUE CROSS/BLUE SHIELD | Admitting: Anesthesiology

## 2017-05-09 ENCOUNTER — Inpatient Hospital Stay (HOSPITAL_COMMUNITY): Payer: BLUE CROSS/BLUE SHIELD

## 2017-05-09 ENCOUNTER — Encounter (HOSPITAL_COMMUNITY): Payer: Self-pay | Admitting: Cardiovascular Disease

## 2017-05-09 ENCOUNTER — Inpatient Hospital Stay (HOSPITAL_COMMUNITY)
Admission: AD | Disposition: A | Discharge status: Home / Self Care | Payer: Self-pay | Source: Ambulatory Visit | Attending: Surgery

## 2017-05-09 DIAGNOSIS — Z951 Presence of aortocoronary bypass graft: Secondary | ICD-10-CM

## 2017-05-09 DIAGNOSIS — I2510 Atherosclerotic heart disease of native coronary artery without angina pectoris: Secondary | ICD-10-CM

## 2017-05-09 HISTORY — PX: CORONARY ARTERY BYPASS GRAFT: SHX141

## 2017-05-09 HISTORY — PX: TEE WITHOUT CARDIOVERSION: SHX5443

## 2017-05-09 LAB — POCT I-STAT 3, ART BLOOD GAS (G3+)
Acid-Base Excess: 4 mmol/L — ABNORMAL HIGH (ref 0.0–2.0)
Acid-Base Excess: 2 mmol/L (ref 0.0–2.0)
Acid-Base Excess: 1 mmol/L (ref 0.0–2.0)
Acid-base deficit: 1 mmol/L (ref 0.0–2.0)
Acid-base deficit: 1 mmol/L (ref 0.0–2.0)
Bicarbonate: 29.2 mmol/L — ABNORMAL HIGH (ref 20.0–28.0)
Bicarbonate: 26.6 mmol/L (ref 20.0–28.0)
Bicarbonate: 26.5 mmol/L (ref 20.0–28.0)
Bicarbonate: 25 mmol/L (ref 20.0–28.0)
Bicarbonate: 24 mmol/L (ref 20.0–28.0)
O2 Saturation: 100 %
O2 Saturation: 100 %
O2 Saturation: 98 %
O2 Saturation: 98 %
O2 Saturation: 93 %
Patient temperature: 36.8
Patient temperature: 37.6
Patient temperature: 37.5
TCO2: 31 mmol/L (ref 0–100)
TCO2: 28 mmol/L (ref 0–100)
TCO2: 28 mmol/L (ref 0–100)
TCO2: 26 mmol/L (ref 0–100)
TCO2: 25 mmol/L (ref 0–100)
pCO2 arterial: 46.4 mmHg (ref 32.0–48.0)
pCO2 arterial: 39 mmHg (ref 32.0–48.0)
pCO2 arterial: 47.2 mmHg (ref 32.0–48.0)
pCO2 arterial: 47.5 mmHg (ref 32.0–48.0)
pCO2 arterial: 43.1 mmHg (ref 32.0–48.0)
pH, Arterial: 7.407 (ref 7.350–7.450)
pH, Arterial: 7.442 (ref 7.350–7.450)
pH, Arterial: 7.356 (ref 7.350–7.450)
pH, Arterial: 7.332 — ABNORMAL LOW (ref 7.350–7.450)
pH, Arterial: 7.356 (ref 7.350–7.450)
pO2, Arterial: 411 mmHg — ABNORMAL HIGH (ref 83.0–108.0)
pO2, Arterial: 175 mmHg — ABNORMAL HIGH (ref 83.0–108.0)
pO2, Arterial: 115 mmHg — ABNORMAL HIGH (ref 83.0–108.0)
pO2, Arterial: 115 mmHg — ABNORMAL HIGH (ref 83.0–108.0)
pO2, Arterial: 73 mmHg — ABNORMAL LOW (ref 83.0–108.0)

## 2017-05-09 LAB — POCT I-STAT, CHEM 8
BUN: 15 mg/dL (ref 6–20)
BUN: 14 mg/dL (ref 6–20)
BUN: 15 mg/dL (ref 6–20)
BUN: 14 mg/dL (ref 6–20)
BUN: 13 mg/dL (ref 6–20)
BUN: 15 mg/dL (ref 6–20)
Calcium, Ion: 1.17 mmol/L (ref 1.15–1.40)
Calcium, Ion: 1.03 mmol/L — ABNORMAL LOW (ref 1.15–1.40)
Calcium, Ion: 1.17 mmol/L (ref 1.15–1.40)
Calcium, Ion: 1.07 mmol/L — ABNORMAL LOW (ref 1.15–1.40)
Calcium, Ion: 1.14 mmol/L — ABNORMAL LOW (ref 1.15–1.40)
Calcium, Ion: 1.13 mmol/L — ABNORMAL LOW (ref 1.15–1.40)
Chloride: 101 mmol/L (ref 101–111)
Chloride: 103 mmol/L (ref 101–111)
Chloride: 99 mmol/L — ABNORMAL LOW (ref 101–111)
Chloride: 100 mmol/L — ABNORMAL LOW (ref 101–111)
Chloride: 102 mmol/L (ref 101–111)
Chloride: 104 mmol/L (ref 101–111)
Creatinine, Ser: 0.7 mg/dL (ref 0.61–1.24)
Creatinine, Ser: 0.6 mg/dL — ABNORMAL LOW (ref 0.61–1.24)
Creatinine, Ser: 0.7 mg/dL (ref 0.61–1.24)
Creatinine, Ser: 0.6 mg/dL — ABNORMAL LOW (ref 0.61–1.24)
Creatinine, Ser: 0.7 mg/dL (ref 0.61–1.24)
Creatinine, Ser: 0.8 mg/dL (ref 0.61–1.24)
Glucose, Bld: 105 mg/dL — ABNORMAL HIGH (ref 65–99)
Glucose, Bld: 101 mg/dL — ABNORMAL HIGH (ref 65–99)
Glucose, Bld: 106 mg/dL — ABNORMAL HIGH (ref 65–99)
Glucose, Bld: 128 mg/dL — ABNORMAL HIGH (ref 65–99)
Glucose, Bld: 131 mg/dL — ABNORMAL HIGH (ref 65–99)
Glucose, Bld: 112 mg/dL — ABNORMAL HIGH (ref 65–99)
HCT: 36 % — ABNORMAL LOW (ref 39.0–52.0)
HCT: 28 % — ABNORMAL LOW (ref 39.0–52.0)
HCT: 33 % — ABNORMAL LOW (ref 39.0–52.0)
HCT: 31 % — ABNORMAL LOW (ref 39.0–52.0)
HCT: 27 % — ABNORMAL LOW (ref 39.0–52.0)
HCT: 31 % — ABNORMAL LOW (ref 39.0–52.0)
Hemoglobin: 12.2 g/dL — ABNORMAL LOW (ref 13.0–17.0)
Hemoglobin: 11.2 g/dL — ABNORMAL LOW (ref 13.0–17.0)
Hemoglobin: 9.5 g/dL — ABNORMAL LOW (ref 13.0–17.0)
Hemoglobin: 10.5 g/dL — ABNORMAL LOW (ref 13.0–17.0)
Hemoglobin: 9.2 g/dL — ABNORMAL LOW (ref 13.0–17.0)
Hemoglobin: 10.5 g/dL — ABNORMAL LOW (ref 13.0–17.0)
Potassium: 3.7 mmol/L (ref 3.5–5.1)
Potassium: 3.7 mmol/L (ref 3.5–5.1)
Potassium: 4.6 mmol/L (ref 3.5–5.1)
Potassium: 4.6 mmol/L (ref 3.5–5.1)
Potassium: 3.9 mmol/L (ref 3.5–5.1)
Potassium: 4.1 mmol/L (ref 3.5–5.1)
Sodium: 141 mmol/L (ref 135–145)
Sodium: 138 mmol/L (ref 135–145)
Sodium: 140 mmol/L (ref 135–145)
Sodium: 138 mmol/L (ref 135–145)
Sodium: 138 mmol/L (ref 135–145)
Sodium: 140 mmol/L (ref 135–145)
TCO2: 31 mmol/L (ref 0–100)
TCO2: 27 mmol/L (ref 0–100)
TCO2: 29 mmol/L (ref 0–100)
TCO2: 29 mmol/L (ref 0–100)
TCO2: 26 mmol/L (ref 0–100)
TCO2: 25 mmol/L (ref 0–100)

## 2017-05-09 LAB — HEMOGLOBIN AND HEMATOCRIT, BLOOD
HCT: 32.4 % — ABNORMAL LOW (ref 39.0–52.0)
Hemoglobin: 10.6 g/dL — ABNORMAL LOW (ref 13.0–17.0)

## 2017-05-09 LAB — CBC
HCT: 40.7 % (ref 39.0–52.0)
HCT: 34.3 % — ABNORMAL LOW (ref 39.0–52.0)
HCT: 34.8 % — ABNORMAL LOW (ref 39.0–52.0)
Hemoglobin: 13.4 g/dL (ref 13.0–17.0)
Hemoglobin: 11.3 g/dL — ABNORMAL LOW (ref 13.0–17.0)
Hemoglobin: 11.4 g/dL — ABNORMAL LOW (ref 13.0–17.0)
MCH: 28.5 pg (ref 26.0–34.0)
MCH: 28.6 pg (ref 26.0–34.0)
MCH: 28.2 pg (ref 26.0–34.0)
MCHC: 32.9 g/dL (ref 30.0–36.0)
MCHC: 32.9 g/dL (ref 30.0–36.0)
MCHC: 32.8 g/dL (ref 30.0–36.0)
MCV: 86.4 fL (ref 78.0–100.0)
MCV: 86.8 fL (ref 78.0–100.0)
MCV: 86.1 fL (ref 78.0–100.0)
Platelets: 246 10*3/uL (ref 150–400)
Platelets: 176 10*3/uL (ref 150–400)
Platelets: 200 10*3/uL (ref 150–400)
RBC: 4.71 MIL/uL (ref 4.22–5.81)
RBC: 3.95 MIL/uL — ABNORMAL LOW (ref 4.22–5.81)
RBC: 4.04 MIL/uL — ABNORMAL LOW (ref 4.22–5.81)
RDW: 13.8 % (ref 11.5–15.5)
RDW: 13.7 % (ref 11.5–15.5)
RDW: 13.7 % (ref 11.5–15.5)
WBC: 11.6 10*3/uL — ABNORMAL HIGH (ref 4.0–10.5)
WBC: 17.2 10*3/uL — ABNORMAL HIGH (ref 4.0–10.5)
WBC: 16 10*3/uL — ABNORMAL HIGH (ref 4.0–10.5)

## 2017-05-09 LAB — BASIC METABOLIC PANEL
Anion gap: 8 (ref 5–15)
BUN: 12 mg/dL (ref 6–20)
CO2: 26 mmol/L (ref 22–32)
Calcium: 8.1 mg/dL — ABNORMAL LOW (ref 8.9–10.3)
Chloride: 104 mmol/L (ref 101–111)
Creatinine, Ser: 1.07 mg/dL (ref 0.61–1.24)
GFR calc Af Amer: >60 mL/min (ref >60)
GFR calc non Af Amer: >60 mL/min (ref >60)
Glucose, Bld: 91 mg/dL (ref 65–99)
Potassium: 3.5 mmol/L (ref 3.5–5.1)
Sodium: 138 mmol/L (ref 135–145)

## 2017-05-09 LAB — GLUCOSE, CAPILLARY
Glucose-Capillary: 125 mg/dL — ABNORMAL HIGH (ref 65–99)
Glucose-Capillary: 100 mg/dL — ABNORMAL HIGH (ref 65–99)
Glucose-Capillary: 124 mg/dL — ABNORMAL HIGH (ref 65–99)
Glucose-Capillary: 115 mg/dL — ABNORMAL HIGH (ref 65–99)
Glucose-Capillary: 115 mg/dL — ABNORMAL HIGH (ref 65–99)
Glucose-Capillary: 116 mg/dL — ABNORMAL HIGH (ref 65–99)
Glucose-Capillary: 136 mg/dL — ABNORMAL HIGH (ref 65–99)
Glucose-Capillary: 120 mg/dL — ABNORMAL HIGH (ref 65–99)
Glucose-Capillary: 115 mg/dL — ABNORMAL HIGH (ref 65–99)

## 2017-05-09 LAB — POCT I-STAT 4, (NA,K, GLUC, HGB,HCT)
Glucose, Bld: 113 mg/dL — ABNORMAL HIGH (ref 65–99)
HCT: 31 % — ABNORMAL LOW (ref 39.0–52.0)
Hemoglobin: 10.5 g/dL — ABNORMAL LOW (ref 13.0–17.0)
Potassium: 4.2 mmol/L (ref 3.5–5.1)
Sodium: 144 mmol/L (ref 135–145)

## 2017-05-09 LAB — PROTIME-INR
INR: 1.43
Prothrombin Time: 17.6 seconds — ABNORMAL HIGH (ref 11.4–15.2)

## 2017-05-09 LAB — APTT: aPTT: 34 seconds (ref 24–36)

## 2017-05-09 LAB — CREATININE, SERUM
Creatinine, Ser: 0.94 mg/dL (ref 0.61–1.24)
GFR calc Af Amer: >60 mL/min (ref >60)
GFR calc non Af Amer: >60 mL/min (ref >60)

## 2017-05-09 LAB — HEMOGLOBIN A1C
Hgb A1c MFr Bld: 5.4 % (ref 4.8–5.6)
Mean Plasma Glucose: 108 mg/dL

## 2017-05-09 LAB — MAGNESIUM: Magnesium: 2.7 mg/dL — ABNORMAL HIGH (ref 1.7–2.4)

## 2017-05-09 LAB — PLATELET COUNT: Platelets: 186 10*3/uL (ref 150–400)

## 2017-05-09 SURGERY — CORONARY ARTERY BYPASS GRAFTING (CABG)
Anesthesia: General | Site: Chest

## 2017-05-09 MED ORDER — PHENYLEPHRINE HCL 10 MG/ML IJ SOLN
INTRAMUSCULAR | Status: DC | PRN
  Administered 2017-05-09: 10 ug/min via INTRAVENOUS

## 2017-05-09 MED ORDER — PANTOPRAZOLE SODIUM 40 MG PO TBEC: 40.0000 mg | DELAYED_RELEASE_TABLET | Freq: Every day | ORAL | Status: DC

## 2017-05-09 MED ORDER — METOPROLOL TARTRATE 5 MG/5ML IV SOLN
2.5000 mg | INTRAVENOUS | Status: DC | PRN
Start: 2017-05-09 — End: 2017-05-11

## 2017-05-09 MED ORDER — ARTIFICIAL TEARS OPHTHALMIC OINT
TOPICAL_OINTMENT | OPHTHALMIC | Status: AC
  Filled 2017-05-09: qty 3.5

## 2017-05-09 MED ORDER — HEPARIN SODIUM (PORCINE) 1000 UNIT/ML IJ SOLN
INTRAMUSCULAR | Status: DC | PRN
  Administered 2017-05-09: 45 mL via INTRAVENOUS

## 2017-05-09 MED ORDER — LIDOCAINE 2% (20 MG/ML) 5 ML SYRINGE
INTRAMUSCULAR | Status: AC
  Filled 2017-05-09: qty 5

## 2017-05-09 MED ORDER — SODIUM CHLORIDE 0.45 % IV SOLN
INTRAVENOUS | Status: DC | PRN
  Administered 2017-05-09: 13:00:00 via INTRAVENOUS

## 2017-05-09 MED ORDER — MIDAZOLAM HCL 5 MG/5ML IJ SOLN
INTRAMUSCULAR | Status: DC | PRN
  Administered 2017-05-09: 3 mg via INTRAVENOUS
  Administered 2017-05-09: 5 mg via INTRAVENOUS
  Administered 2017-05-09: 1 mg via INTRAVENOUS
  Administered 2017-05-09: 3 mg via INTRAVENOUS
  Administered 2017-05-09: 2 mg via INTRAVENOUS
  Administered 2017-05-09: 1 mg via INTRAVENOUS
  Administered 2017-05-09: 5 mg via INTRAVENOUS

## 2017-05-09 MED ORDER — PHENYLEPHRINE HCL 10 MG/ML IJ SOLN
INTRAMUSCULAR | Status: DC | PRN
Start: 2017-05-09 — End: 2017-05-09
  Administered 2017-05-09: 40 ug via INTRAVENOUS
  Administered 2017-05-09: 80 ug via INTRAVENOUS
  Administered 2017-05-09: 40 ug via INTRAVENOUS

## 2017-05-09 MED ORDER — BISACODYL 10 MG RE SUPP: 10.0000 mg | Freq: Every day | RECTAL | Status: DC

## 2017-05-09 MED ORDER — SODIUM CHLORIDE 0.9% FLUSH: 3.0000 mL | Freq: Two times a day (BID) | INTRAVENOUS | Status: DC

## 2017-05-09 MED ORDER — CHLORHEXIDINE GLUCONATE 0.12 % MT SOLN
15.0000 mL | OROMUCOSAL | Status: AC
Start: 2017-05-09 — End: 2017-05-09
  Administered 2017-05-09: 15 mL via OROMUCOSAL

## 2017-05-09 MED ORDER — MORPHINE SULFATE (PF) 2 MG/ML IV SOLN: 1.0000 mg | INTRAVENOUS | Status: DC | PRN

## 2017-05-09 MED ORDER — THROMBIN 20000 UNITS EX SOLR
CUTANEOUS | Status: DC | PRN
  Administered 2017-05-09 (×3): 4 mL via TOPICAL

## 2017-05-09 MED ORDER — OXYCODONE HCL 5 MG PO TABS: 5.0000 mg | ORAL_TABLET | ORAL | Status: DC | PRN

## 2017-05-09 MED ORDER — ASPIRIN 81 MG PO CHEW: 324.0000 mg | CHEWABLE_TABLET | Freq: Every day | ORAL | Status: DC

## 2017-05-09 MED ORDER — LACTATED RINGERS IV SOLN
INTRAVENOUS | Status: DC | PRN
  Administered 2017-05-09: 07:00:00 via INTRAVENOUS

## 2017-05-09 MED ORDER — HEPARIN SODIUM (PORCINE) 1000 UNIT/ML IJ SOLN
INTRAMUSCULAR | Status: AC
  Filled 2017-05-09: qty 1

## 2017-05-09 MED ORDER — ACETAMINOPHEN 160 MG/5ML PO SOLN: 650.0000 mg | Freq: Once | ORAL | Status: AC

## 2017-05-09 MED ORDER — MUPIROCIN 2 % EX OINT
1.0000 "application " | TOPICAL_OINTMENT | Freq: Two times a day (BID) | CUTANEOUS | Status: DC
  Administered 2017-05-09 – 2017-05-13 (×9): 1 via NASAL
  Filled 2017-05-09 (×4): qty 22

## 2017-05-09 MED ORDER — METOPROLOL TARTRATE 12.5 MG HALF TABLET
12.5000 mg | ORAL_TABLET | Freq: Two times a day (BID) | ORAL | Status: DC
  Administered 2017-05-10 (×2): 12.5 mg via ORAL
  Filled 2017-05-09 (×2): qty 1

## 2017-05-09 MED ORDER — ALBUMIN HUMAN 5 % IV SOLN
INTRAVENOUS | Status: DC | PRN
  Administered 2017-05-09: 12:00:00 via INTRAVENOUS

## 2017-05-09 MED ORDER — PROTAMINE SULFATE 10 MG/ML IV SOLN
INTRAVENOUS | Status: AC
  Filled 2017-05-09: qty 25

## 2017-05-09 MED ORDER — ROCURONIUM BROMIDE 10 MG/ML (PF) SYRINGE
PREFILLED_SYRINGE | INTRAVENOUS | Status: AC
  Filled 2017-05-09: qty 5

## 2017-05-09 MED ORDER — SODIUM CHLORIDE 0.9 % IV SOLN
0.0000 ug/min | INTRAVENOUS | Status: DC
  Filled 2017-05-09 (×2): qty 2

## 2017-05-09 MED ORDER — MORPHINE SULFATE (PF) 4 MG/ML IV SOLN
2.0000 mg | INTRAVENOUS | Status: DC | PRN
  Administered 2017-05-09 – 2017-05-10 (×4): 4 mg via INTRAVENOUS
  Filled 2017-05-09 (×4): qty 1

## 2017-05-09 MED ORDER — THROMBIN 20000 UNITS EX SOLR: CUTANEOUS | Status: DC | PRN

## 2017-05-09 MED ORDER — ORAL CARE MOUTH RINSE
15.0000 mL | Freq: Four times a day (QID) | OROMUCOSAL | Status: DC
  Administered 2017-05-09 (×2): 15 mL via OROMUCOSAL

## 2017-05-09 MED ORDER — VANCOMYCIN HCL IN DEXTROSE 1-5 GM/200ML-% IV SOLN
1000.0000 mg | Freq: Once | INTRAVENOUS | Status: AC
  Administered 2017-05-09: 1000 mg via INTRAVENOUS
  Filled 2017-05-09: qty 200

## 2017-05-09 MED ORDER — ALBUMIN HUMAN 5 % IV SOLN
250.0000 mL | INTRAVENOUS | Status: AC | PRN
  Administered 2017-05-09 (×2): 250 mL via INTRAVENOUS

## 2017-05-09 MED ORDER — MIDAZOLAM HCL 10 MG/2ML IJ SOLN
INTRAMUSCULAR | Status: AC
  Filled 2017-05-09: qty 2

## 2017-05-09 MED ORDER — ASPIRIN EC 325 MG PO TBEC
325.0000 mg | DELAYED_RELEASE_TABLET | Freq: Every day | ORAL | Status: DC
  Administered 2017-05-10: 325 mg via ORAL
  Filled 2017-05-09: qty 1

## 2017-05-09 MED ORDER — NITROGLYCERIN IN D5W 200-5 MCG/ML-% IV SOLN
INTRAVENOUS | Status: DC | PRN
  Administered 2017-05-09: 10 ug/min via INTRAVENOUS

## 2017-05-09 MED ORDER — PHENYLEPHRINE HCL 10 MG/ML IJ SOLN
INTRAVENOUS | Status: DC | PRN
  Administered 2017-05-09: 40 ug/min via INTRAVENOUS

## 2017-05-09 MED ORDER — HEPARIN SODIUM (PORCINE) 1000 UNIT/ML IJ SOLN
INTRAMUSCULAR | Status: AC
  Filled 2017-05-09: qty 2

## 2017-05-09 MED ORDER — ROCURONIUM BROMIDE 100 MG/10ML IV SOLN
INTRAVENOUS | Status: DC | PRN
  Administered 2017-05-09 (×5): 50 mg via INTRAVENOUS

## 2017-05-09 MED ORDER — PROTAMINE SULFATE 10 MG/ML IV SOLN
INTRAVENOUS | Status: DC | PRN
  Administered 2017-05-09: 10 mg via INTRAVENOUS
  Administered 2017-05-09: 200 mg via INTRAVENOUS
  Administered 2017-05-09: 190 mg via INTRAVENOUS

## 2017-05-09 MED ORDER — ACETAMINOPHEN 500 MG PO TABS
1000.0000 mg | ORAL_TABLET | Freq: Four times a day (QID) | ORAL | Status: DC
  Administered 2017-05-09 – 2017-05-11 (×6): 1000 mg via ORAL
  Filled 2017-05-09 (×6): qty 2

## 2017-05-09 MED ORDER — CHLORHEXIDINE GLUCONATE 0.12% ORAL RINSE (MEDLINE KIT)
15.0000 mL | Freq: Two times a day (BID) | OROMUCOSAL | Status: DC
  Administered 2017-05-09 – 2017-05-10 (×2): 15 mL via OROMUCOSAL

## 2017-05-09 MED ORDER — SODIUM CHLORIDE 0.9 % IV SOLN
INTRAVENOUS | Status: DC | PRN
  Administered 2017-05-09: 0.2 ug/kg/h via INTRAVENOUS

## 2017-05-09 MED ORDER — MORPHINE SULFATE (PF) 4 MG/ML IV SOLN
1.0000 mg | INTRAVENOUS | Status: AC | PRN
  Administered 2017-05-09: 2 mg via INTRAVENOUS
  Administered 2017-05-09: 1 mg via INTRAVENOUS
  Filled 2017-05-09 (×2): qty 1

## 2017-05-09 MED ORDER — SODIUM CHLORIDE 0.9 % IV SOLN
INTRAVENOUS | Status: DC
  Administered 2017-05-09: 13:00:00 via INTRAVENOUS

## 2017-05-09 MED ORDER — SODIUM CHLORIDE 0.9 % IV SOLN
INTRAVENOUS | Status: DC | PRN
  Administered 2017-05-09: 1 [IU]/h via INTRAVENOUS

## 2017-05-09 MED ORDER — PROPOFOL 10 MG/ML IV BOLUS
INTRAVENOUS | Status: AC
  Filled 2017-05-09: qty 20

## 2017-05-09 MED ORDER — HEMOSTATIC AGENTS (NO CHARGE) OPTIME
TOPICAL | Status: DC | PRN
  Administered 2017-05-09: 1 via TOPICAL

## 2017-05-09 MED ORDER — LACTATED RINGERS IV SOLN: 500.0000 mL | Freq: Once | INTRAVENOUS | Status: DC | PRN

## 2017-05-09 MED ORDER — DOCUSATE SODIUM 100 MG PO CAPS
200.0000 mg | ORAL_CAPSULE | Freq: Every day | ORAL | Status: DC
  Administered 2017-05-10: 200 mg via ORAL
  Filled 2017-05-09: qty 2

## 2017-05-09 MED ORDER — CALCIUM CHLORIDE 10 % IV SOLN
INTRAVENOUS | Status: AC
  Filled 2017-05-09: qty 10

## 2017-05-09 MED ORDER — ACETAMINOPHEN 160 MG/5ML PO SOLN: 1000.0000 mg | Freq: Four times a day (QID) | ORAL | Status: DC

## 2017-05-09 MED ORDER — ACETAMINOPHEN 650 MG RE SUPP
650.0000 mg | Freq: Once | RECTAL | Status: AC
  Administered 2017-05-09: 650 mg via RECTAL

## 2017-05-09 MED ORDER — METOPROLOL TARTRATE 25 MG/10 ML ORAL SUSPENSION: 12.5000 mg | Freq: Two times a day (BID) | ORAL | Status: DC

## 2017-05-09 MED ORDER — SODIUM CHLORIDE 0.9 % IV SOLN
INTRAVENOUS | Status: DC | PRN
  Administered 2017-05-09: 12:00:00 via INTRAVENOUS

## 2017-05-09 MED ORDER — TRAMADOL HCL 50 MG PO TABS
50.0000 mg | ORAL_TABLET | ORAL | Status: DC | PRN
  Administered 2017-05-11: 50 mg via ORAL
  Filled 2017-05-09: qty 1

## 2017-05-09 MED ORDER — MAGNESIUM SULFATE 4 GM/100ML IV SOLN
4.0000 g | Freq: Once | INTRAVENOUS | Status: AC
  Administered 2017-05-09: 4 g via INTRAVENOUS
  Filled 2017-05-09: qty 100

## 2017-05-09 MED ORDER — POTASSIUM CHLORIDE 10 MEQ/50ML IV SOLN: 10.0000 meq | INTRAVENOUS | Status: AC

## 2017-05-09 MED ORDER — DEXTROSE 5 % IV SOLN
1.5000 g | Freq: Two times a day (BID) | INTRAVENOUS | Status: AC
  Administered 2017-05-09 – 2017-05-11 (×4): 1.5 g via INTRAVENOUS
  Filled 2017-05-09 (×4): qty 1.5

## 2017-05-09 MED ORDER — ATORVASTATIN CALCIUM 80 MG PO TABS
80.0000 mg | ORAL_TABLET | Freq: Every day | ORAL | Status: DC
  Administered 2017-05-10 – 2017-05-12 (×3): 80 mg via ORAL
  Filled 2017-05-09 (×3): qty 1

## 2017-05-09 MED ORDER — INSULIN REGULAR BOLUS VIA INFUSION
0.0000 [IU] | Freq: Three times a day (TID) | INTRAVENOUS | Status: DC
  Filled 2017-05-09: qty 10

## 2017-05-09 MED ORDER — ONDANSETRON HCL 4 MG/2ML IJ SOLN: 4.0000 mg | Freq: Four times a day (QID) | INTRAMUSCULAR | Status: DC | PRN

## 2017-05-09 MED ORDER — PAPAVERINE HCL 30 MG/ML IJ SOLN
INTRAMUSCULAR | Status: DC | PRN
  Administered 2017-05-09: 500 mL via INTRAVASCULAR

## 2017-05-09 MED ORDER — FENTANYL CITRATE (PF) 250 MCG/5ML IJ SOLN
INTRAMUSCULAR | Status: AC
  Filled 2017-05-09: qty 5

## 2017-05-09 MED ORDER — MORPHINE SULFATE (PF) 2 MG/ML IV SOLN: 2.0000 mg | INTRAVENOUS | Status: DC | PRN

## 2017-05-09 MED ORDER — PROPOFOL 10 MG/ML IV BOLUS
INTRAVENOUS | Status: DC | PRN
  Administered 2017-05-09: 60 mg via INTRAVENOUS
  Administered 2017-05-09: 20 mg via INTRAVENOUS

## 2017-05-09 MED ORDER — CHLORHEXIDINE GLUCONATE CLOTH 2 % EX PADS
6.0000 | MEDICATED_PAD | Freq: Every day | CUTANEOUS | Status: DC
  Administered 2017-05-09 – 2017-05-13 (×4): 6 via TOPICAL

## 2017-05-09 MED ORDER — PROTAMINE SULFATE 10 MG/ML IV SOLN
INTRAVENOUS | Status: AC
  Filled 2017-05-09: qty 20

## 2017-05-09 MED ORDER — SODIUM CHLORIDE 0.9 % IV SOLN: 250.0000 mL | INTRAVENOUS | Status: DC

## 2017-05-09 MED ORDER — SODIUM CHLORIDE 0.9 % IV SOLN: 0.0000 ug/kg/h | INTRAVENOUS | Status: DC

## 2017-05-09 MED ORDER — FENTANYL CITRATE (PF) 250 MCG/5ML IJ SOLN
INTRAMUSCULAR | Status: AC
  Filled 2017-05-09: qty 20

## 2017-05-09 MED ORDER — SODIUM CHLORIDE 0.9 % IV SOLN: INTRAVENOUS | Status: DC

## 2017-05-09 MED ORDER — THROMBIN 20000 UNITS EX SOLR
CUTANEOUS | Status: AC
  Filled 2017-05-09: qty 20000

## 2017-05-09 MED ORDER — EPHEDRINE 5 MG/ML INJ
INTRAVENOUS | Status: AC
  Filled 2017-05-09: qty 10

## 2017-05-09 MED ORDER — BISACODYL 5 MG PO TBEC
10.0000 mg | DELAYED_RELEASE_TABLET | Freq: Every day | ORAL | Status: DC
  Administered 2017-05-10: 10 mg via ORAL
  Filled 2017-05-09: qty 2

## 2017-05-09 MED ORDER — PHENYLEPHRINE 40 MCG/ML (10ML) SYRINGE FOR IV PUSH (FOR BLOOD PRESSURE SUPPORT)
PREFILLED_SYRINGE | INTRAVENOUS | Status: AC
  Filled 2017-05-09: qty 10

## 2017-05-09 MED ORDER — FAMOTIDINE IN NACL 20-0.9 MG/50ML-% IV SOLN
20.0000 mg | Freq: Two times a day (BID) | INTRAVENOUS | Status: AC
  Administered 2017-05-09: 20 mg via INTRAVENOUS

## 2017-05-09 MED ORDER — FENTANYL CITRATE (PF) 250 MCG/5ML IJ SOLN
INTRAMUSCULAR | Status: DC | PRN
  Administered 2017-05-09: 850 ug via INTRAVENOUS
  Administered 2017-05-09: 100 ug via INTRAVENOUS
  Administered 2017-05-09 (×2): 50 ug via INTRAVENOUS
  Administered 2017-05-09: 250 ug via INTRAVENOUS
  Administered 2017-05-09 (×2): 100 ug via INTRAVENOUS

## 2017-05-09 MED ORDER — VANCOMYCIN HCL 1000 MG IV SOLR
INTRAVENOUS | Status: DC | PRN
  Administered 2017-05-09: 1500 mg via INTRAVENOUS

## 2017-05-09 MED ORDER — LACTATED RINGERS IV SOLN: INTRAVENOUS | Status: DC

## 2017-05-09 MED ORDER — MIDAZOLAM HCL 2 MG/2ML IJ SOLN: 2.0000 mg | INTRAMUSCULAR | Status: DC | PRN

## 2017-05-09 MED ORDER — DEXTROSE 5 % IV SOLN
INTRAVENOUS | Status: DC | PRN
  Administered 2017-05-09: .75 g via INTRAVENOUS
  Administered 2017-05-09: 1.5 g via INTRAVENOUS

## 2017-05-09 MED ORDER — LACTATED RINGERS IV SOLN
INTRAVENOUS | Status: DC | PRN
  Administered 2017-05-09 (×2): via INTRAVENOUS

## 2017-05-09 MED ORDER — NITROGLYCERIN IN D5W 200-5 MCG/ML-% IV SOLN: 0.0000 ug/min | INTRAVENOUS | Status: DC

## 2017-05-09 MED ORDER — SODIUM CHLORIDE 0.9% FLUSH: 3.0000 mL | INTRAVENOUS | Status: DC | PRN

## 2017-05-09 MED ORDER — LIDOCAINE HCL (CARDIAC) 20 MG/ML IV SOLN: INTRAVENOUS | Status: DC | PRN

## 2017-05-09 MED FILL — Heparin Sodium (Porcine) Inj 1000 Unit/ML: INTRAMUSCULAR | Qty: 30 | Status: AC

## 2017-05-09 MED FILL — Heparin Sodium (Porcine) Inj 1000 Unit/ML: INTRAMUSCULAR | Qty: 10 | Status: AC

## 2017-05-09 MED FILL — Electrolyte-R (PH 7.4) Solution: INTRAVENOUS | Qty: 3000 | Status: AC

## 2017-05-09 MED FILL — Lidocaine HCl IV Inj 20 MG/ML: INTRAVENOUS | Qty: 5 | Status: AC

## 2017-05-09 MED FILL — Magnesium Sulfate Inj 50%: INTRAMUSCULAR | Qty: 10 | Status: AC

## 2017-05-09 MED FILL — Mannitol IV Soln 20%: INTRAVENOUS | Qty: 500 | Status: AC

## 2017-05-09 MED FILL — Lidocaine HCl Local Inj 1%: INTRAMUSCULAR | Qty: 20 | Status: AC

## 2017-05-09 MED FILL — Sodium Chloride IV Soln 0.9%: INTRAVENOUS | Qty: 2000 | Status: AC

## 2017-05-09 MED FILL — Sodium Bicarbonate IV Soln 8.4%: INTRAVENOUS | Qty: 50 | Status: AC

## 2017-05-09 MED FILL — Potassium Chloride Inj 2 mEq/ML: INTRAVENOUS | Qty: 10 | Status: AC

## 2017-05-09 SURGICAL SUPPLY — 101 items
BAG DECANTER FOR FLEXI CONT (MISCELLANEOUS) ×3 IMPLANT
BANDAGE ACE 4X5 VEL STRL LF (GAUZE/BANDAGES/DRESSINGS) ×3 IMPLANT
BANDAGE ACE 6X5 VEL STRL LF (GAUZE/BANDAGES/DRESSINGS) ×3 IMPLANT
BASKET HEART (ORDER IN 25'S) (MISCELLANEOUS) ×1
BASKET HEART (ORDER IN 25S) (MISCELLANEOUS) ×2 IMPLANT
BLADE 11 SAFETY STRL DISP (BLADE) ×3 IMPLANT
BLADE STERNUM SYSTEM 6 (BLADE) ×3 IMPLANT
BNDG GAUZE ELAST 4 BULKY (GAUZE/BANDAGES/DRESSINGS) ×3 IMPLANT
CANISTER SUCT 3000ML PPV (MISCELLANEOUS) ×3 IMPLANT
CATH ROBINSON RED A/P 18FR (CATHETERS) ×6 IMPLANT
CATH THORACIC 28FR (CATHETERS) ×3 IMPLANT
CATH THORACIC 36FR (CATHETERS) ×3 IMPLANT
CATH THORACIC 36FR RT ANG (CATHETERS) ×3 IMPLANT
CLIP TI MEDIUM 24 (CLIP) IMPLANT
CLIP TI WIDE RED SMALL 24 (CLIP) IMPLANT
CRADLE DONUT ADULT HEAD (MISCELLANEOUS) ×3 IMPLANT
DRAPE CARDIOVASCULAR INCISE (DRAPES) ×1
DRAPE SLUSH/WARMER DISC (DRAPES) ×3 IMPLANT
DRAPE SRG 135X102X78XABS (DRAPES) ×2 IMPLANT
DRSG COVADERM 4X14 (GAUZE/BANDAGES/DRESSINGS) ×3 IMPLANT
ELECT CAUTERY BLADE 6.4 (BLADE) ×3 IMPLANT
ELECT REM PT RETURN 9FT ADLT (ELECTROSURGICAL) ×6
ELECTRODE REM PT RTRN 9FT ADLT (ELECTROSURGICAL) ×4 IMPLANT
FELT TEFLON 1X6 (MISCELLANEOUS) ×9 IMPLANT
GAUZE SPONGE 4X4 12PLY STRL (GAUZE/BANDAGES/DRESSINGS) ×6 IMPLANT
GAUZE SPONGE 4X4 12PLY STRL LF (GAUZE/BANDAGES/DRESSINGS) ×6 IMPLANT
GLOVE BIO SURGEON STRL SZ 6 (GLOVE) IMPLANT
GLOVE BIO SURGEON STRL SZ 6.5 (GLOVE) ×9 IMPLANT
GLOVE BIO SURGEON STRL SZ7 (GLOVE) IMPLANT
GLOVE BIO SURGEON STRL SZ7.5 (GLOVE) IMPLANT
GLOVE BIOGEL PI IND STRL 6 (GLOVE) IMPLANT
GLOVE BIOGEL PI IND STRL 6.5 (GLOVE) ×12 IMPLANT
GLOVE BIOGEL PI IND STRL 7.0 (GLOVE) IMPLANT
GLOVE BIOGEL PI INDICATOR 6 (GLOVE)
GLOVE BIOGEL PI INDICATOR 6.5 (GLOVE) ×6
GLOVE BIOGEL PI INDICATOR 7.0 (GLOVE)
GLOVE EUDERMIC 7 POWDERFREE (GLOVE) ×6 IMPLANT
GLOVE ORTHO TXT STRL SZ7.5 (GLOVE) ×9 IMPLANT
GLOVE SS N UNI LF 6.0 STRL (GLOVE) ×6 IMPLANT
GOWN STRL REUS W/ TWL LRG LVL3 (GOWN DISPOSABLE) ×12 IMPLANT
GOWN STRL REUS W/ TWL XL LVL3 (GOWN DISPOSABLE) ×2 IMPLANT
GOWN STRL REUS W/TWL LRG LVL3 (GOWN DISPOSABLE) ×6
GOWN STRL REUS W/TWL XL LVL3 (GOWN DISPOSABLE) ×1
HEMOSTAT POWDER SURGIFOAM 1G (HEMOSTASIS) ×9 IMPLANT
HEMOSTAT SURGICEL 2X14 (HEMOSTASIS) ×3 IMPLANT
INSERT FOGARTY 61MM (MISCELLANEOUS) IMPLANT
INSERT FOGARTY XLG (MISCELLANEOUS) IMPLANT
KIT BASIN OR (CUSTOM PROCEDURE TRAY) ×3 IMPLANT
KIT CATH CPB BARTLE (MISCELLANEOUS) ×3 IMPLANT
KIT ROOM TURNOVER OR (KITS) ×3 IMPLANT
KIT SUCTION CATH 14FR (SUCTIONS) ×3 IMPLANT
KIT VASOVIEW HEMOPRO VH 3000 (KITS) ×3 IMPLANT
NS IRRIG 1000ML POUR BTL (IV SOLUTION) ×15 IMPLANT
PACK OPEN HEART (CUSTOM PROCEDURE TRAY) ×3 IMPLANT
PAD ARMBOARD 7.5X6 YLW CONV (MISCELLANEOUS) ×6 IMPLANT
PAD ELECT DEFIB RADIOL ZOLL (MISCELLANEOUS) ×3 IMPLANT
PENCIL BUTTON HOLSTER BLD 10FT (ELECTRODE) ×3 IMPLANT
PUNCH AORTIC ROTATE 4.0MM (MISCELLANEOUS) IMPLANT
PUNCH AORTIC ROTATE 4.5MM 8IN (MISCELLANEOUS) ×3 IMPLANT
PUNCH AORTIC ROTATE 5MM 8IN (MISCELLANEOUS) IMPLANT
SET CARDIOPLEGIA MPS 5001102 (MISCELLANEOUS) ×3 IMPLANT
SOLUTION ANTI FOG 6CC (MISCELLANEOUS) ×3 IMPLANT
SPONGE INTESTINAL PEANUT (DISPOSABLE) IMPLANT
SPONGE LAP 18X18 X RAY DECT (DISPOSABLE) ×3 IMPLANT
SPONGE LAP 4X18 X RAY DECT (DISPOSABLE) ×6 IMPLANT
SUT BONE WAX W31G (SUTURE) ×3 IMPLANT
SUT MNCRL AB 4-0 PS2 18 (SUTURE) ×3 IMPLANT
SUT PROLENE 3 0 SH DA (SUTURE) IMPLANT
SUT PROLENE 3 0 SH1 36 (SUTURE) ×3 IMPLANT
SUT PROLENE 4 0 RB 1 (SUTURE)
SUT PROLENE 4 0 SH DA (SUTURE) IMPLANT
SUT PROLENE 4-0 RB1 .5 CRCL 36 (SUTURE) IMPLANT
SUT PROLENE 5 0 C 1 36 (SUTURE) IMPLANT
SUT PROLENE 6 0 C 1 30 (SUTURE) IMPLANT
SUT PROLENE 7 0 BV 1 (SUTURE) IMPLANT
SUT PROLENE 7 0 BV1 MDA (SUTURE) ×3 IMPLANT
SUT PROLENE 8 0 BV175 6 (SUTURE) IMPLANT
SUT SILK  1 MH (SUTURE)
SUT SILK 1 MH (SUTURE) IMPLANT
SUT STEEL STERNAL CCS#1 18IN (SUTURE) IMPLANT
SUT STEEL SZ 6 DBL 3X14 BALL (SUTURE) ×9 IMPLANT
SUT VIC AB 1 CTX 36 (SUTURE) ×3
SUT VIC AB 1 CTX36XBRD ANBCTR (SUTURE) ×6 IMPLANT
SUT VIC AB 2-0 CT1 27 (SUTURE) ×1
SUT VIC AB 2-0 CT1 TAPERPNT 27 (SUTURE) ×2 IMPLANT
SUT VIC AB 2-0 CTX 27 (SUTURE) IMPLANT
SUT VIC AB 3-0 SH 27 (SUTURE)
SUT VIC AB 3-0 SH 27X BRD (SUTURE) IMPLANT
SUT VIC AB 3-0 X1 27 (SUTURE) IMPLANT
SUT VICRYL 4-0 PS2 18IN ABS (SUTURE) IMPLANT
SUTURE E-PAK OPEN HEART (SUTURE) ×3 IMPLANT
SYSTEM SAHARA CHEST DRAIN ATS (WOUND CARE) ×3 IMPLANT
TAPE CLOTH SURG 4X10 WHT LF (GAUZE/BANDAGES/DRESSINGS) ×3 IMPLANT
TOWEL GREEN STERILE (TOWEL DISPOSABLE) ×12 IMPLANT
TOWEL GREEN STERILE FF (TOWEL DISPOSABLE) ×6 IMPLANT
TOWEL OR 17X24 6PK STRL BLUE (TOWEL DISPOSABLE) ×3 IMPLANT
TOWEL OR 17X26 10 PK STRL BLUE (TOWEL DISPOSABLE) ×3 IMPLANT
TRAY FOLEY SILVER 16FR TEMP (SET/KITS/TRAYS/PACK) ×3 IMPLANT
TUBING INSUFFLATION (TUBING) ×3 IMPLANT
UNDERPAD 30X30 (UNDERPADS AND DIAPERS) ×3 IMPLANT
WATER STERILE IRR 1000ML POUR (IV SOLUTION) ×6 IMPLANT

## 2017-05-09 NOTE — Progress Notes (Signed)
Pt escorted to short stay prior to procedure. Report given to CRNA, all questions answered appropriately and chart sent down with pt.

## 2017-05-09 NOTE — Transfer of Care (Signed)
Immediate Anesthesia Transfer of Care Note  Patient: Dominic Ray  Procedure(s) Performed: Procedure(s): CORONARY ARTERY BYPASS GRAFTING times four using left internal mammary artery and right leg saphenous vein   (N/A) TRANSESOPHAGEAL ECHOCARDIOGRAM (TEE) (N/A)  Patient Location: PACU  Anesthesia Type:General  Level of Consciousness: sedated and Patient remains intubated per anesthesia plan  Airway & Oxygen Therapy: Patient remains intubated per anesthesia plan and Patient placed on Ventilator (see vital sign flow sheet for setting)  Post-op Assessment: Report given to RN and Post -op Vital signs reviewed and stable  Post vital signs: Reviewed and stable  Last Vitals:  Vitals:   05/09/17 0415 05/09/17 1313  BP: 111/64 105/73  Pulse: 65 89  Resp: 18 13  Temp: 36.6 C     Last Pain:  Vitals:   05/09/17 0415  TempSrc: Oral  PainSc:       Patients Stated Pain Goal: 3 (77/82/42 3536)  Complications: No apparent anesthesia complications

## 2017-05-09 NOTE — Brief Op Note (Signed)
05/08/2017 - 05/09/2017  11:22 AM  PATIENT:  Dominic Ray  61 y.o. male  PRE-OPERATIVE DIAGNOSIS:  CAD  POST-OPERATIVE DIAGNOSIS:  coronary artery disease  PROCEDURE:  Procedure(s): CORONARY ARTERY BYPASS GRAFTING times four using left internal mammary artery and right leg saphenous vein   (N/A) TRANSESOPHAGEAL ECHOCARDIOGRAM (TEE) (N/A)  LIMA to LAD SVG to AM SVG to PDA SVG to OM1  SURGEON:  Surgeon(s) and Role:    * Bartle, Fernande Boyden, MD - Primary  PHYSICIAN ASSISTANT:  Nicholes Rough, PA-C   ANESTHESIA:   general  EBL:  Total I/O In: 1700 [I.V.:1700] Out: 110 [Urine:110]  BLOOD ADMINISTERED:none  DRAINS: routine   LOCAL MEDICATIONS USED:  NONE  SPECIMEN:  No Specimen  DISPOSITION OF SPECIMEN:  N/A  COUNTS:  YES  TOURNIQUET:  * No tourniquets in log *  DICTATION: .Dragon Dictation  PLAN OF CARE: Admit to inpatient   PATIENT DISPOSITION:  ICU - intubated and hemodynamically stable.   Delay start of Pharmacological VTE agent (>24hrs) due to surgical blood loss or risk of bleeding: yes

## 2017-05-09 NOTE — Anesthesia Procedure Notes (Signed)
Central Venous Catheter Insertion Performed by: Lillia Abed, anesthesiologist Start/End6/26/2018 6:30 AM, 05/09/2017 6:45 AM Patient location: Pre-op. Preanesthetic checklist: patient identified, IV checked, risks and benefits discussed, surgical consent, monitors and equipment checked, pre-op evaluation, timeout performed and anesthesia consent Patient sedated Hand hygiene performed  and maximum sterile barriers used  Catheter size: 8.5 Fr Sheath introducer Procedure performed using ultrasound guided technique. Ultrasound Notes:anatomy identified, needle tip was noted to be adjacent to the nerve/plexus identified, no ultrasound evidence of intravascular and/or intraneural injection and image(s) printed for medical record Attempts: 1 Following insertion, line sutured and dressing applied. Post procedure assessment: blood return through all ports, free fluid flow and no air  Patient tolerated the procedure well with no immediate complications.

## 2017-05-09 NOTE — Progress Notes (Signed)
Patient ID: Dominic Ray, male   DOB: 05/25/1956, 61 y.o.   MRN: [id number]   SICU Evening Rounds:   Hemodynamically stable  CI = 2.6  Extubated and alert   Urine output good  CT output low  CBC    Component Value Date/Time   WBC 16.0 (H) 05/09/2017 1857   RBC 4.04 (L) 05/09/2017 1857   HGB 10.5 (L) 05/09/2017 1905   HGB 15.3 05/01/2017 1215   HCT 31.0 (L) 05/09/2017 1905   HCT 46.4 05/01/2017 1215   PLT 200 05/09/2017 1857   PLT 283 05/01/2017 1215   MCV 86.1 05/09/2017 1857   MCV 87 05/01/2017 1215   MCH 28.2 05/09/2017 1857   MCHC 32.8 05/09/2017 1857   RDW 13.7 05/09/2017 1857   RDW 14.2 05/01/2017 1215     BMET    Component Value Date/Time   NA 140 05/09/2017 1905   NA 138 05/01/2017 1215   K 4.1 05/09/2017 1905   CL 104 05/09/2017 1905   CO2 26 05/09/2017 0457   GLUCOSE 112 (H) 05/09/2017 1905   BUN 15 05/09/2017 1905   BUN 13 05/01/2017 1215   CREATININE 0.80 05/09/2017 1905   CREATININE 1.07 12/06/2012 0857   CALCIUM 8.1 (L) 05/09/2017 0457   GFRNONAA >60 05/09/2017 1857   GFRAA >60 05/09/2017 1857     A/P:  Stable postop course. Continue current plans

## 2017-05-09 NOTE — OR Nursing (Signed)
Second call made to SICU at 1225

## 2017-05-09 NOTE — Op Note (Signed)
CARDIOVASCULAR SURGERY OPERATIVE NOTE  05/09/2017  Surgeon:  Gaye Pollack, MD  First Assistant: Nicholes Rough, PA-C   Preoperative Diagnosis:  Severe multi-vessel coronary artery disease   Postoperative Diagnosis:  Same   Procedure:  1. Median Sternotomy 2. Extracorporeal circulation 3.   Coronary artery bypass grafting x 4   Left internal mammary graft to the LAD  SVG to diagonal  SVG to AM  SVG to PDA 4.   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 They resolved quickly with rest. He saw [name] and had an echo that was normal. An ETT on 04/25/2017 showed abnormal 2 mm ST depression in the inferior leads and lead 1 with peak exercise. He had a cardiac cath today that shows a high grade proximal LAD stenosis after D1. The LCX system has two moderate caliber OM branches and then the small distal branch is occluded. The RCA has a large AM branch and then is occluded with the AM supplying collaterals to the PDA. LV function is preserved.   He has severe multi-vessel coronary artery disease with an abnormal ETT and recent onset of intermittent exertional chest pressure. I have personally reviewed his cath and echo studies. He has a high grade proximal LAD stenosis and occlusion of the proximal to mid RCA with collateral to the PDA from a large AM. His LV function is normal. I agree that CABG is the best treatment to resolve his symptoms and prevent MI in this active young patient. This will need to be followed by maximum cardiac risk factor  reduction. I discussed the operative procedure with the patient and family including alternatives, benefits and risks; including but not limited to bleeding, blood transfusion, infection, stroke, myocardial infarction, graft failure, heart block requiring a permanent pacemaker, organ dysfunction, and death.  [facility] understands and agrees to proceed.   Preparation:  The patient was seen in the preoperative holding area and the correct patient, correct operation were confirmed with the patient after reviewing the medical record and catheterization. The consent was signed by me. Preoperative antibiotics were given. A pulmonary arterial line and radial arterial line were placed by the anesthesia team. The patient was taken back to the operating room and positioned supine on the operating room table. After being placed under general endotracheal anesthesia by the anesthesia team a foley catheter was placed. The neck, chest, abdomen, and both legs were prepped with betadine soap and solution and draped in the usual sterile manner. A surgical time-out was taken and the correct patient and operative procedure were confirmed with the nursing and anesthesia staff.   Cardiopulmonary Bypass:  A median sternotomy was performed. The pericardium was opened in the midline. Right ventricular function appeared normal. The ascending aorta was of normal size and had no palpable plaque. There were no contraindications to aortic cannulation or cross-clamping. The patient was fully systemically heparinized and the ACT was maintained > 400 sec. The proximal aortic arch was cannulated with a 20 F aortic cannula for arterial inflow. Venous cannulation was performed via the right atrial appendage using a two-staged venous cannula. An antegrade cardioplegia/vent cannula was inserted into the mid-ascending aorta.  Aortic occlusion was performed with a single cross-clamp. Systemic cooling to 32 degrees Centigrade and topical cooling  of the heart with iced saline were used. Hyperkalemic antegrade cold blood cardioplegia was used to induce diastolic arrest and was then given at about 20 minute intervals throughout the period of arrest to maintain myocardial temperature at or below 10 degrees centigrade. A temperature probe was inserted into the interventricular septum and an insulating pad was placed in the pericardium.   Left internal mammary harvest:  The left side of the sternum was retracted using the Rultract retractor. The left internal mammary artery was harvested as a pedicle graft. All side branches were clipped. It was a medium-sized vessel of good quality with excellent blood flow. It was ligated distally and divided. It was sprayed with topical papaverine solution to prevent vasospasm.   Endoscopic vein harvest:  The right greater saphenous vein was harvested endoscopically through a 2 cm incision medial to the right knee. It was harvested from the upper thigh to below the knee. It was a medium-sized vein of excellent quality. The side branches were all ligated with 4-0 silk ties.    Coronary arteries:  The coronary arteries were examined.   LAD:  Diffuse patchy plaque. Diagonal heavily diseased proximally and there was one short area just proximal to the bifurcation that was graftable. The vessel bifurcated into two small branches that had no disease.  LCX:  Ramus and OM were visible proximally and had no significant disease, then became intramyocardial. The distal OM was a small caliber vessel that was not graftable.  RCA:  AM and PDA were both moderate sized vessels with minimal disease.   Grafts:  1. LIMA to the LAD: 2.0 mm. It was sewn end to side using 8-0 prolene continuous suture. 2. SVG to diagonal:  1.6 mm. It was sewn end to side using 7-0 prolene continuous suture. 3. SVG to AM :  1.6 mm. It was sewn end to side using 7-0 prolene continuous suture. 4. SVG to PDA:  1.6 mm. It was sewn end to  side using 7-0 prolene continuous suture.  The proximal vein graft anastomoses were performed to the mid-ascending aorta using continuous 6-0 prolene suture. Graft markers were placed around the proximal anastomoses.   Completion:  The patient was rewarmed to 37 degrees Centigrade. The clamp was removed from the LIMA pedicle and there was rapid warming of the septum and return of ventricular fibrillation. The crossclamp was removed with a time of 78 minutes. There was spontaneous return of sinus rhythm. The distal and proximal anastomoses were checked for hemostasis. The position of the grafts was satisfactory. Two temporary epicardial pacing wires were placed on the right atrium and two on the right ventricle. The patient was weaned from CPB without difficulty on no inotropes. CPB time was 96 minutes. Cardiac output was 5 LPM. TEE showed normal LV function. Heparin was fully reversed with protamine and the aortic and venous cannulas removed. Hemostasis was achieved. Mediastinal and left pleural drainage tubes were placed. The sternum was closed with double #6 stainless steel wires. The fascia was closed with continuous # 1 vicryl suture. The subcutaneous tissue was closed with 2-0 vicryl continuous suture. The skin was closed with 3-0 vicryl subcuticular suture. All sponge, needle, and instrument counts were reported correct at the end of the case. Dry sterile dressings were placed over the incisions and around the chest tubes which were connected to pleurevac suction. The patient was then transported to  the surgical intensive care unit in critical but stable condition.

## 2017-05-09 NOTE — Progress Notes (Signed)
  Echocardiogram Transesophageal has been performed.  Dominic Ray Dominic Ray 05/09/2017, 12:48 PM

## 2017-05-09 NOTE — Procedures (Signed)
Extubation Procedure Note  Patient Details:   Name: Dominic Ray DOB: 02-29-1956 MRN: [id number]   Airway Documentation:     Evaluation  O2 sats: stable throughout Complications: No apparent complications Patient did tolerate procedure well. Bilateral Breath Sounds: Clear, Diminished   Yes   Pt extubated to 2L N/C.  VC ~ 1145 mL, NIF -20.  No stridor noted.  RN at bedside.  Donnetta Hail 05/09/2017, 5:33 PM

## 2017-05-09 NOTE — Anesthesia Procedure Notes (Signed)
Procedure Name: Intubation Date/Time: 05/09/2017 7:47 AM Performed by: Trixie Deis A Pre-anesthesia Checklist: Patient identified, Emergency Drugs available, Suction available and Patient being monitored Patient Re-evaluated:Patient Re-evaluated prior to inductionOxygen Delivery Method: Circle System Utilized Preoxygenation: Pre-oxygenation with 100% oxygen Intubation Type: IV induction Ventilation: Mask ventilation without difficulty Laryngoscope Size: Mac and 4 Grade View: Grade II Tube type: Oral Tube size: 8.0 mm Number of attempts: 1 Airway Equipment and Method: Stylet and Oral airway Placement Confirmation: ETT inserted through vocal cords under direct vision,  positive ETCO2 and breath sounds checked- equal and bilateral Secured at: 23 cm Tube secured with: Tape Dental Injury: Teeth and Oropharynx as per pre-operative assessment

## 2017-05-10 ENCOUNTER — Encounter (HOSPITAL_COMMUNITY): Payer: Self-pay | Admitting: Surgery

## 2017-05-10 ENCOUNTER — Inpatient Hospital Stay (HOSPITAL_COMMUNITY): Payer: BLUE CROSS/BLUE SHIELD

## 2017-05-10 LAB — BASIC METABOLIC PANEL
Anion gap: 7 (ref 5–15)
BUN: 14 mg/dL (ref 6–20)
CO2: 24 mmol/L (ref 22–32)
Calcium: 7.3 mg/dL — ABNORMAL LOW (ref 8.9–10.3)
Chloride: 108 mmol/L (ref 101–111)
Creatinine, Ser: 0.98 mg/dL (ref 0.61–1.24)
GFR calc Af Amer: >60 mL/min (ref >60)
GFR calc non Af Amer: >60 mL/min (ref >60)
Glucose, Bld: 100 mg/dL — ABNORMAL HIGH (ref 65–99)
Potassium: 3.8 mmol/L (ref 3.5–5.1)
Sodium: 139 mmol/L (ref 135–145)

## 2017-05-10 LAB — CBC
HCT: 34.2 % — ABNORMAL LOW (ref 39.0–52.0)
HCT: 36.3 % — ABNORMAL LOW (ref 39.0–52.0)
Hemoglobin: 11.1 g/dL — ABNORMAL LOW (ref 13.0–17.0)
Hemoglobin: 11.9 g/dL — ABNORMAL LOW (ref 13.0–17.0)
MCH: 28.5 pg (ref 26.0–34.0)
MCH: 28.5 pg (ref 26.0–34.0)
MCHC: 32.5 g/dL (ref 30.0–36.0)
MCHC: 32.8 g/dL (ref 30.0–36.0)
MCV: 87.9 fL (ref 78.0–100.0)
MCV: 86.8 fL (ref 78.0–100.0)
Platelets: 182 10*3/uL (ref 150–400)
Platelets: 191 10*3/uL (ref 150–400)
RBC: 3.89 MIL/uL — ABNORMAL LOW (ref 4.22–5.81)
RBC: 4.18 MIL/uL — ABNORMAL LOW (ref 4.22–5.81)
RDW: 14.2 % (ref 11.5–15.5)
RDW: 14.2 % (ref 11.5–15.5)
WBC: 13.5 10*3/uL — ABNORMAL HIGH (ref 4.0–10.5)
WBC: 15.8 10*3/uL — ABNORMAL HIGH (ref 4.0–10.5)

## 2017-05-10 LAB — POCT I-STAT, CHEM 8
BUN: 16 mg/dL (ref 6–20)
Calcium, Ion: 1.09 mmol/L — ABNORMAL LOW (ref 1.15–1.40)
Chloride: 97 mmol/L — ABNORMAL LOW (ref 101–111)
Creatinine, Ser: 0.9 mg/dL (ref 0.61–1.24)
Glucose, Bld: 131 mg/dL — ABNORMAL HIGH (ref 65–99)
HCT: 34 % — ABNORMAL LOW (ref 39.0–52.0)
Hemoglobin: 11.6 g/dL — ABNORMAL LOW (ref 13.0–17.0)
Potassium: 3.6 mmol/L (ref 3.5–5.1)
Sodium: 139 mmol/L (ref 135–145)
TCO2: 28 mmol/L (ref 0–100)

## 2017-05-10 LAB — GLUCOSE, CAPILLARY
Glucose-Capillary: 100 mg/dL — ABNORMAL HIGH (ref 65–99)
Glucose-Capillary: 101 mg/dL — ABNORMAL HIGH (ref 65–99)
Glucose-Capillary: 131 mg/dL — ABNORMAL HIGH (ref 65–99)
Glucose-Capillary: 157 mg/dL — ABNORMAL HIGH (ref 65–99)
Glucose-Capillary: 90 mg/dL (ref 65–99)
Glucose-Capillary: 96 mg/dL (ref 65–99)

## 2017-05-10 LAB — CREATININE, SERUM
Creatinine, Ser: 1 mg/dL (ref 0.61–1.24)
GFR calc Af Amer: >60 mL/min (ref >60)
GFR calc non Af Amer: >60 mL/min (ref >60)

## 2017-05-10 LAB — MAGNESIUM
Magnesium: 2.1 mg/dL (ref 1.7–2.4)
Magnesium: 2 mg/dL (ref 1.7–2.4)

## 2017-05-10 MED ORDER — ENOXAPARIN SODIUM 40 MG/0.4ML ~~LOC~~ SOLN
40.0000 mg | Freq: Every day | SUBCUTANEOUS | Status: DC
  Administered 2017-05-10 – 2017-05-12 (×3): 40 mg via SUBCUTANEOUS
  Filled 2017-05-10 (×3): qty 0.4

## 2017-05-10 MED ORDER — POTASSIUM CHLORIDE CRYS ER 20 MEQ PO TBCR
20.0000 meq | EXTENDED_RELEASE_TABLET | Freq: Two times a day (BID) | ORAL | Status: AC
  Administered 2017-05-10 (×2): 20 meq via ORAL
  Filled 2017-05-10 (×2): qty 1

## 2017-05-10 MED ORDER — POTASSIUM CHLORIDE 10 MEQ/50ML IV SOLN
10.0000 meq | INTRAVENOUS | Status: AC
  Administered 2017-05-10 (×3): 10 meq via INTRAVENOUS
  Filled 2017-05-10 (×3): qty 50

## 2017-05-10 MED ORDER — FUROSEMIDE 10 MG/ML IJ SOLN
40.0000 mg | Freq: Two times a day (BID) | INTRAMUSCULAR | Status: AC
  Administered 2017-05-10 (×2): 40 mg via INTRAVENOUS
  Filled 2017-05-10 (×2): qty 4

## 2017-05-10 MED ORDER — INSULIN ASPART 100 UNIT/ML ~~LOC~~ SOLN
0.0000 [IU] | SUBCUTANEOUS | Status: DC
  Administered 2017-05-10 (×3): 2 [IU] via SUBCUTANEOUS

## 2017-05-10 MED ORDER — INSULIN ASPART 100 UNIT/ML ~~LOC~~ SOLN: 0.0000 [IU] | SUBCUTANEOUS | Status: DC

## 2017-05-10 NOTE — Progress Notes (Signed)
1 Day Post-Op Procedure(s) (LRB): CORONARY ARTERY BYPASS GRAFTING times four using left internal mammary artery and right leg saphenous vein   (N/A) TRANSESOPHAGEAL ECHOCARDIOGRAM (TEE) (N/A) Subjective: No complaints  Objective: Vital signs in last 24 hours: Temp:  [98.1 F (36.7 C)-100.4 F (38 C)] 99.5 F (37.5 C) (06/27 0715) Pulse Rate:  [85-99] 91 (06/27 0715) Cardiac Rhythm: Normal sinus rhythm (06/27 0600) Resp:  [10-29] 24 (06/27 0715) BP: (87-130)/(62-74) 105/65 (06/27 0700) SpO2:  [95 %-100 %] 98 % (06/27 0715) Arterial Line BP: (91-154)/(48-78) 147/57 (06/27 0715) FiO2 (%):  [40 %-50 %] 40 % (06/26 1655) Weight:  [103.1 kg (227 lb 4.7 oz)] 103.1 kg (227 lb 4.7 oz) (06/27 0454)  Hemodynamic parameters for last 24 hours: PAP: (25-45)/(9-28) 33/12 CO:  [3.4 L/min-8.4 L/min] 8.4 L/min CI:  [1.6 L/min/m2-3.9 L/min/m2] 3.9 L/min/m2  Intake/Output from previous day: 06/26 0701 - 06/27 0700 In: 5227 [P.O.:200; I.V.:3712; Blood:400; NG/GT:15; IV ZMOQHUTML:465] Out: 0354 [SFKCL:2751; Emesis/NG output:50; Blood:850; Chest Tube:610] Intake/Output this shift: No intake/output data recorded.  General appearance: alert and cooperative Neurologic: intact Heart: regular rate and rhythm, S1, S2 normal, no murmur, click, rub or gallop Lungs: clear to auscultation bilaterally Extremities: edema mild Wound: dressings dry  Lab Results:  Recent Labs  05/09/17 1857 05/09/17 1905 05/10/17 0410  WBC 16.0*  --  13.5*  HGB 11.4* 10.5* 11.1*  HCT 34.8* 31.0* 34.2*  PLT 200  --  182   BMET:  Recent Labs  05/09/17 0457  05/09/17 1905 05/10/17 0410  NA 138  < > 140 139  K 3.5  < > 4.1 3.8  CL 104  < > 104 108  CO2 26  --   --  24  GLUCOSE 91  < > 112* 100*  BUN 12  < > 15 14  CREATININE 1.07  < > 0.80 0.98  CALCIUM 8.1*  --   --  7.3*  < > = values in this interval not displayed.  PT/INR:  Recent Labs  05/09/17 1312  LABPROT 17.6*  INR 1.43   ABG    Component  Value Date/Time   PHART 7.356 05/09/2017 1834   HCO3 24.0 05/09/2017 1834   TCO2 25 05/09/2017 1905   ACIDBASEDEF 1.0 05/09/2017 1834   O2SAT 93.0 05/09/2017 1834   CBG (last 3)   Recent Labs  05/09/17 2351 05/10/17 0051 05/10/17 0402  GLUCAP 101* 90 96   CXR: ok  ECG: sinus, no acute changes  Assessment/Plan: S/P Procedure(s) (LRB): CORONARY ARTERY BYPASS GRAFTING times four using left internal mammary artery and right leg saphenous vein   (N/A) TRANSESOPHAGEAL ECHOCARDIOGRAM (TEE) (N/A)  He is hemodynamically stable in sinus rhythm. Continue low dose Lopressor ASA Mobilize Diuresis: weight is 10 lbs over preop. Preop Hgb A1c was 5.4 and glucose under control postop. Will continue SSI for now and if staying stable this am should be able to stop. Hyperlipidemia: continue statin.  d/c tubes/lines Continue foley due to diuresing patient and patient in ICU See progression orders   LOS: 2 days    Dominic Ray 05/10/2017

## 2017-05-10 NOTE — Care Management Note (Signed)
Case Management Note Marvetta Gibbons RN, BSN Unit 2W-Case Manager--2H coverage [phone]  Patient Details  Name: Dominic Ray MRN: [id number] Date of Birth: 03-May-1956  Subjective/Objective:   Pt admitted s/p CABGx4 on 05/09/17                Action/Plan: PTA pt lived at home with spouse- CM to follow for d/c needs.   Expected Discharge Date:                  Expected Discharge Plan:  Home/Self Care  In-House Referral:     Discharge planning Services  CM Consult  Post Acute Care Choice:    Choice offered to:     DME Arranged:    DME Agency:     HH Arranged:    HH Agency:     Status of Service:  In process, will continue to follow  If discussed at Long Length of Stay Meetings, dates discussed:    Discharge Disposition:   Additional Comments:  Dawayne Patricia, RN 05/10/2017, 10:25 AM

## 2017-05-10 NOTE — Anesthesia Postprocedure Evaluation (Signed)
Anesthesia Post Note  Patient: Dominic Ray  Procedure(s) Performed: Procedure(s) (LRB): CORONARY ARTERY BYPASS GRAFTING times four using left internal mammary artery and right leg saphenous vein   (N/A) TRANSESOPHAGEAL ECHOCARDIOGRAM (TEE) (N/A)     Patient location during evaluation: ICU Anesthesia Type: General Level of consciousness: awake Pain management: pain level controlled Vital Signs Assessment: post-procedure vital signs reviewed and stable Respiratory status: spontaneous breathing, nonlabored ventilation and respiratory function stable Cardiovascular status: stable Postop Assessment: no signs of nausea or vomiting Anesthetic complications: no    Last Vitals:  Vitals:   05/10/17 1259 05/10/17 1300  BP:  120/75  Pulse:  84  Resp:  (!) 22  Temp: 36.9 C     Last Pain:  Vitals:   05/10/17 1259  TempSrc: Oral  PainSc:                  Reene Harlacher P Phil Corti

## 2017-05-10 NOTE — Progress Notes (Signed)
Patient ID: Dominic Ray, male   DOB: 06/06/1956, 61 y.o.   MRN: [id number] EVENING ROUNDS NOTE :     [address]       [place] [address]             [phone]                 1 Day Post-Op Procedure(s) (LRB): CORONARY ARTERY BYPASS GRAFTING times four using left internal mammary artery and right leg saphenous vein   (N/A) TRANSESOPHAGEAL ECHOCARDIOGRAM (TEE) (N/A)  Total Length of Stay:  LOS: 2 days  BP 124/71   Pulse 90   Temp 98.2 F (36.8 C) (Oral)   Resp (!) 25   Ht 5\' 9"  (1.753 m)   Wt 227 lb 4.7 oz (103.1 kg)   SpO2 93%   BMI 33.57 kg/m   .Intake/Output      06/26 0701 - 06/27 0700 06/27 0701 - 06/28 0700   P.O. 200 360   I.V. (mL/kg) 3712 (36) 220 (2.1)   Blood 400    NG/GT 15    IV Piggyback 900 50   Total Intake(mL/kg) 5227 (50.7) 630 (6.1)   Urine (mL/kg/hr) 1546 (0.6) 2200 (1.8)   Emesis/NG output 50 (0)    Stool     Blood 850 (0.3)    Chest Tube 610 (0.2)    Total Output 3056 2200   Net +2171 -1570          . sodium chloride Stopped (05/10/17 1057)  . sodium chloride Stopped (05/10/17 1057)  . sodium chloride 20 mL/hr at 05/10/17 0700  . cefUROXime (ZINACEF)  IV Stopped (05/10/17 0800)  . lactated ringers    . lactated ringers 20 mL/hr at 05/10/17 0700  . lactated ringers Stopped (05/09/17 1315)  . nitroGLYCERIN Stopped (05/10/17 1058)  . phenylephrine (NEO-SYNEPHRINE) Adult infusion Stopped (05/10/17 0400)     Lab Results  Component Value Date   WBC 15.8 (H) 05/10/2017   HGB 11.6 (L) 05/10/2017   HCT 34.0 (L) 05/10/2017   PLT 191 05/10/2017   GLUCOSE 131 (H) 05/10/2017   CHOL 203 (H) 05/01/2017   TRIG 176 (H) 05/01/2017   HDL 44 05/01/2017   LDLCALC 124 (H) 05/01/2017   ALT 18 05/01/2017   AST 14 05/01/2017   NA 139 05/10/2017   K 3.6 05/10/2017   CL 97 (L) 05/10/2017   CREATININE 0.90 05/10/2017   BUN 16 05/10/2017   CO2 24 05/10/2017   TSH 1.267 08/28/2012   INR 1.43 05/09/2017   HGBA1C 5.4 05/08/2017    Stable day Holding sinus  Grace Isaac MD  Beeper [phone] Office [phone] 05/10/2017 6:50 PM

## 2017-05-11 ENCOUNTER — Inpatient Hospital Stay (HOSPITAL_COMMUNITY): Payer: BLUE CROSS/BLUE SHIELD

## 2017-05-11 LAB — BASIC METABOLIC PANEL
Anion gap: 9 (ref 5–15)
BUN: 14 mg/dL (ref 6–20)
CO2: 29 mmol/L (ref 22–32)
Calcium: 7.6 mg/dL — ABNORMAL LOW (ref 8.9–10.3)
Chloride: 99 mmol/L — ABNORMAL LOW (ref 101–111)
Creatinine, Ser: 0.86 mg/dL (ref 0.61–1.24)
GFR calc Af Amer: >60 mL/min (ref >60)
GFR calc non Af Amer: >60 mL/min (ref >60)
Glucose, Bld: 100 mg/dL — ABNORMAL HIGH (ref 65–99)
Potassium: 3.4 mmol/L — ABNORMAL LOW (ref 3.5–5.1)
Sodium: 137 mmol/L (ref 135–145)

## 2017-05-11 LAB — CBC
HCT: 34.9 % — ABNORMAL LOW (ref 39.0–52.0)
Hemoglobin: 11.2 g/dL — ABNORMAL LOW (ref 13.0–17.0)
MCH: 27.9 pg (ref 26.0–34.0)
MCHC: 32.1 g/dL (ref 30.0–36.0)
MCV: 86.8 fL (ref 78.0–100.0)
Platelets: 176 10*3/uL (ref 150–400)
RBC: 4.02 MIL/uL — ABNORMAL LOW (ref 4.22–5.81)
RDW: 14 % (ref 11.5–15.5)
WBC: 15.5 10*3/uL — ABNORMAL HIGH (ref 4.0–10.5)

## 2017-05-11 LAB — GLUCOSE, CAPILLARY
Glucose-Capillary: 110 mg/dL — ABNORMAL HIGH (ref 65–99)
Glucose-Capillary: 112 mg/dL — ABNORMAL HIGH (ref 65–99)
Glucose-Capillary: 97 mg/dL (ref 65–99)

## 2017-05-11 MED ORDER — SODIUM CHLORIDE 0.9% FLUSH
3.0000 mL | INTRAVENOUS | Status: DC | PRN
  Administered 2017-05-11 (×2): 3 mL via INTRAVENOUS
  Filled 2017-05-11 (×2): qty 3

## 2017-05-11 MED ORDER — OXYCODONE HCL 5 MG PO TABS
5.0000 mg | ORAL_TABLET | ORAL | Status: DC | PRN
  Filled 2017-05-11: qty 1

## 2017-05-11 MED ORDER — METOPROLOL TARTRATE 25 MG PO TABS
25.0000 mg | ORAL_TABLET | Freq: Two times a day (BID) | ORAL | Status: DC
  Administered 2017-05-11 – 2017-05-13 (×5): 25 mg via ORAL
  Filled 2017-05-11 (×5): qty 1

## 2017-05-11 MED ORDER — POTASSIUM CHLORIDE CRYS ER 20 MEQ PO TBCR
20.0000 meq | EXTENDED_RELEASE_TABLET | Freq: Two times a day (BID) | ORAL | Status: AC
  Administered 2017-05-11 – 2017-05-12 (×4): 20 meq via ORAL
  Filled 2017-05-11 (×4): qty 1

## 2017-05-11 MED ORDER — MOVING RIGHT ALONG BOOK
Freq: Once | Status: AC
  Administered 2017-05-11: 09:00:00
  Filled 2017-05-11: qty 1

## 2017-05-11 MED ORDER — BISACODYL 10 MG RE SUPP: 10.0000 mg | Freq: Every day | RECTAL | Status: DC | PRN

## 2017-05-11 MED ORDER — TRAMADOL HCL 50 MG PO TABS
50.0000 mg | ORAL_TABLET | ORAL | Status: DC | PRN
  Administered 2017-05-11 – 2017-05-12 (×2): 100 mg via ORAL
  Filled 2017-05-11 (×2): qty 2

## 2017-05-11 MED ORDER — PANTOPRAZOLE SODIUM 40 MG PO TBEC
40.0000 mg | DELAYED_RELEASE_TABLET | Freq: Every day | ORAL | Status: DC
  Administered 2017-05-11 – 2017-05-13 (×3): 40 mg via ORAL
  Filled 2017-05-11 (×3): qty 1

## 2017-05-11 MED ORDER — SODIUM CHLORIDE 0.9 % IV SOLN: 250.0000 mL | INTRAVENOUS | Status: DC | PRN

## 2017-05-11 MED ORDER — POTASSIUM CHLORIDE 10 MEQ/50ML IV SOLN
10.0000 meq | INTRAVENOUS | Status: DC | PRN
  Administered 2017-05-11 (×2): 10 meq via INTRAVENOUS

## 2017-05-11 MED ORDER — BISACODYL 5 MG PO TBEC: 10.0000 mg | DELAYED_RELEASE_TABLET | Freq: Every day | ORAL | Status: DC | PRN

## 2017-05-11 MED ORDER — ASPIRIN EC 325 MG PO TBEC
325.0000 mg | DELAYED_RELEASE_TABLET | Freq: Every day | ORAL | Status: DC
  Administered 2017-05-11 – 2017-05-13 (×3): 325 mg via ORAL
  Filled 2017-05-11 (×3): qty 1

## 2017-05-11 MED ORDER — ACETAMINOPHEN 325 MG PO TABS: 650.0000 mg | ORAL_TABLET | Freq: Four times a day (QID) | ORAL | Status: DC | PRN

## 2017-05-11 MED ORDER — DOCUSATE SODIUM 100 MG PO CAPS
200.0000 mg | ORAL_CAPSULE | Freq: Every day | ORAL | Status: DC
  Administered 2017-05-11 – 2017-05-13 (×3): 200 mg via ORAL
  Filled 2017-05-11 (×3): qty 2

## 2017-05-11 MED ORDER — ONDANSETRON HCL 4 MG PO TABS: 4.0000 mg | ORAL_TABLET | Freq: Four times a day (QID) | ORAL | Status: DC | PRN

## 2017-05-11 MED ORDER — ONDANSETRON HCL 4 MG/2ML IJ SOLN: 4.0000 mg | Freq: Four times a day (QID) | INTRAMUSCULAR | Status: DC | PRN

## 2017-05-11 MED ORDER — SODIUM CHLORIDE 0.9% FLUSH
3.0000 mL | Freq: Two times a day (BID) | INTRAVENOUS | Status: DC
  Administered 2017-05-11 – 2017-05-13 (×4): 3 mL via INTRAVENOUS

## 2017-05-11 NOTE — Progress Notes (Signed)
2 Days Post-Op Procedure(s) (LRB): CORONARY ARTERY BYPASS GRAFTING times four using left internal mammary artery and right leg saphenous vein   (N/A) TRANSESOPHAGEAL ECHOCARDIOGRAM (TEE) (N/A) Subjective:  No complaints. Walked this am. Not taking much pain meds  Objective: Vital signs in last 24 hours: Temp:  [98.1 F (36.7 C)-99.3 F (37.4 C)] 98.1 F (36.7 C) (06/28 0300) Pulse Rate:  [75-93] 91 (06/28 0700) Cardiac Rhythm: Normal sinus rhythm (06/28 0700) Resp:  [19-29] 24 (06/28 0700) BP: (110-140)/(65-82) 128/73 (06/28 0700) SpO2:  [92 %-99 %] 96 % (06/28 0700) Arterial Line BP: (136)/(56) 136/56 (06/27 0800) Weight:  [99.4 kg (219 lb 2.2 oz)] 99.4 kg (219 lb 2.2 oz) (06/28 0500)  Hemodynamic parameters for last 24 hours: PAP: (30)/(12) 30/12  Intake/Output from previous day: 06/27 0701 - 06/28 0700 In: 6606 [P.O.:560; I.V.:480; IV Piggyback:150] Out: 3016 [Urine:4275] Intake/Output this shift: No intake/output data recorded.  General appearance: alert and cooperative Neurologic: intact Heart: regular rate and rhythm, S1, S2 normal, no murmur, click, rub or gallop Lungs: clear to auscultation bilaterally Extremities: edema mild Wound: dressings dry  Lab Results:  Recent Labs  05/10/17 1619 05/10/17 1625 05/11/17 0509  WBC 15.8*  --  15.5*  HGB 11.9* 11.6* 11.2*  HCT 36.3* 34.0* 34.9*  PLT 191  --  176   BMET:  Recent Labs  05/10/17 0410  05/10/17 1625 05/11/17 0509  NA 139  --  139 137  K 3.8  --  3.6 3.4*  CL 108  --  97* 99*  CO2 24  --   --  29  GLUCOSE 100*  --  131* 100*  BUN 14  --  16 14  CREATININE 0.98  < > 0.90 0.86  CALCIUM 7.3*  --   --  7.6*  < > = values in this interval not displayed.  PT/INR:  Recent Labs  05/09/17 1312  LABPROT 17.6*  INR 1.43   ABG    Component Value Date/Time   PHART 7.356 05/09/2017 1834   HCO3 24.0 05/09/2017 1834   TCO2 28 05/10/2017 1625   ACIDBASEDEF 1.0 05/09/2017 1834   O2SAT 93.0  05/09/2017 1834   CBG (last 3)   Recent Labs  05/10/17 1953 05/11/17 0000 05/11/17 0349  GLUCAP 131* 110* 112*   CLINICAL DATA:  Sore chest.  CABG.  EXAM: PORTABLE CHEST 1 VIEW  COMPARISON:  Yesterday  FINDINGS: Swan-Ganz removed. Introducer in the right jugular vein remains in place. Left chest tubes removed. No ensuing pneumothorax. Low volumes. Bibasilar atelectasis. No sign of pulmonary edema.  IMPRESSION: Left chest tube removal without pneumothorax.  Bibasilar atelectasis  No sign of CHF.   Electronically Signed   By: Marybelle Killings M.D.   On: 05/11/2017 07:28  Assessment/Plan: S/P Procedure(s) (LRB): CORONARY ARTERY BYPASS GRAFTING times four using left internal mammary artery and right leg saphenous vein   (N/A) TRANSESOPHAGEAL ECHOCARDIOGRAM (TEE) (N/A)  He is hemodynamically stable in sinus rhythm. Continue Lopressor.  Mild volume excess: will give some additional oral Lasix and replace K+. Weight is 2 lbs over preop.  Glucose under good control so can stop CBG's and SSI.  Continue IS, ambulation  Remove foley and sleeve.  Transfer to 2W       LOS: 3 days    Gaye Pollack 05/11/2017

## 2017-05-11 NOTE — Progress Notes (Signed)
Pt arrived to 2w from 2h. Telemetry box applied and CCMD notified. Pt oriented to room and staff. Pt denies needs at this time. Will continue current plan of care.  Grant Fontana BSN, RN

## 2017-05-11 NOTE — Progress Notes (Signed)
CARDIAC REHAB PHASE I   PRE:  Rate/Rhythm: 76 SR  BP:  Sitting: 107/66        SaO2: 98 RA  MODE:  Ambulation: 840 ft   POST:  Rate/Rhythm: 86 SR  BP:  Sitting: 130/70         SaO2: 97 RA  Pt ambulated 840 ft on RA, rolling walker, steady gait, tolerated well with no complaints. Pt required min assist in and out of bed. Encouraged additional ambulation x1 today. Pt to bed per pt request after walk, call bell within reach. Will follow.   [place], RN, BSN 05/11/2017 1:45 PM

## 2017-05-12 MED ORDER — LACTULOSE 10 GM/15ML PO SOLN
10.0000 g | Freq: Every day | ORAL | Status: DC | PRN
  Administered 2017-05-12: 10 g via ORAL
  Filled 2017-05-12: qty 15

## 2017-05-12 MED ORDER — FUROSEMIDE 40 MG PO TABS
40.0000 mg | ORAL_TABLET | Freq: Every day | ORAL | Status: DC
  Administered 2017-05-12 – 2017-05-13 (×2): 40 mg via ORAL
  Filled 2017-05-12 (×2): qty 1

## 2017-05-12 MED ORDER — POTASSIUM CHLORIDE CRYS ER 20 MEQ PO TBCR
20.0000 meq | EXTENDED_RELEASE_TABLET | Freq: Every day | ORAL | Status: DC
  Administered 2017-05-12 – 2017-05-13 (×2): 20 meq via ORAL
  Filled 2017-05-12 (×2): qty 1

## 2017-05-12 NOTE — Assessment & Plan Note (Signed)
   Left internal mammary graft to the LAD  SVG to diagonal  SVG to AM  SVG to PDA

## 2017-05-12 NOTE — Progress Notes (Addendum)
      [address]       [place] [address]             [phone]        3 Days Post-Op Procedure(s) (LRB): CORONARY ARTERY BYPASS GRAFTING times four using left internal mammary artery and right leg saphenous vein   (N/A) TRANSESOPHAGEAL ECHOCARDIOGRAM (TEE) (N/A)  Subjective: Patient passing gas but no bowel movement yet.  Objective: Vital signs in last 24 hours: Temp:  [98 F (36.7 C)-99.1 F (37.3 C)] 99 F (37.2 C) (06/29 0403) Pulse Rate:  [80-105] 90 (06/29 0403) Cardiac Rhythm: Normal sinus rhythm (06/29 0700) Resp:  [17-22] 18 (06/29 0403) BP: (107-121)/(62-70) 117/62 (06/29 0403) SpO2:  [94 %-98 %] 95 % (06/29 0403) Weight:  [99.2 kg (218 lb 12.8 oz)] 99.2 kg (218 lb 12.8 oz) (06/29 0403)  Pre op weight 98.4 kg Current Weight  05/12/17 99.2 kg (218 lb 12.8 oz)      Intake/Output from previous day: 06/28 0701 - 06/29 0700 In: 670 [P.O.:600; I.V.:20; IV Piggyback:50] Out: 750 [Urine:750]   Physical Exam:  Cardiovascular: Slightly tachycardic Pulmonary: Slightly diminshed at bases Abdomen: Soft, non tender, bowel sounds present. Extremities: Mild bilateral lower extremity edema. Wounds: Sternal dressing removed. Wound is clean and dry.  No erythema or signs of infection.  Lab Results: CBC: Recent Labs  05/10/17 1619 05/10/17 1625 05/11/17 0509  WBC 15.8*  --  15.5*  HGB 11.9* 11.6* 11.2*  HCT 36.3* 34.0* 34.9*  PLT 191  --  176   BMET:  Recent Labs  05/10/17 0410  05/10/17 1625 05/11/17 0509  NA 139  --  139 137  K 3.8  --  3.6 3.4*  CL 108  --  97* 99*  CO2 24  --   --  29  GLUCOSE 100*  --  131* 100*  BUN 14  --  16 14  CREATININE 0.98  < > 0.90 0.86  CALCIUM 7.3*  --   --  7.6*  < > = values in this interval not displayed.  PT/INR:  Lab Results  Component Value Date   INR 1.43 05/09/2017   INR 1.07 05/08/2017   INR 1.0 05/01/2017   ABG:  INR: Will add last result for INR, ABG once components are  confirmed Will add last 4 CBG results once components are confirmed  Assessment/Plan:  1. CV - First degree heart block. On Lopressor 25 mg bid. 2.  Pulmonary - On room air. Encourage incentive spirometer. 3. Volume Overload - Will give Lasix 40 mg daily 4.  Acute blood loss anemia - Last H and H stable at 11.2 and 34.9 5. Low grade fever to 99-likely related to atelectasis as no sign of wound infection. 6. Remove EPW 7. If no BM today, will give laxative in am 8. Likely home in 1-2 days  ZIMMERMAN,DONIELLE MPA-C 05/12/2017,8:48 AM   Chart reviewed, patient examined, agree with above. He is doing well, walking well, no BM yet. I think he could go home tomorrow if his bowels move. Will order laxative for this evening if his bowels do not move after dinner.

## 2017-05-12 NOTE — Discharge Summary (Signed)
Physician Discharge Summary       [facility]       [place] [address]             [phone]    Patient ID: Dominic Ray MRN: [id number] DOB/AGE: [birth year] 61 y.o.  Admit date: 05/08/2017 Discharge date: 05/13/2017  Admission Diagnoses: Coronary artery disease  Active Diagnoses:  1. Hypertension 2. Hyperlipidemia 3. Basal cell carcinoma of nasal tip 4. Onychomycosis 5. Male hypogonadism 6. ABL anemia  Procedure (s):  Left Heart Cath and Coronary Angiography by Dr. Claiborne Billings on 05/08/2017:  Conclusion     Ost LM-1 lesion, 30 %stenosed.  Ost LM-2 lesion, 30 %stenosed.  1st Diag lesion, 70 %stenosed.  Mid LAD lesion, 90 %stenosed.  Prox RCA lesion, 90 %stenosed.  Mid Cx lesion, 100 %stenosed.  Mid RCA lesion, 100 %stenosed.  The left ventricular systolic function is normal.  The left ventricular ejection fraction is 50-55% by visual estimate.   Normal LV function with mild focal basal inferior hypocontractility.  Significant multivessel CAD with 30% stenoses in the mid distal left main coronary artery; 70% percent stenosis in the first diagonal branch of the LAD with 90% focal stenosis in the LAD after the septal perforator and first diagonal branch; normal small ramus intermediate vessel; total occlusion of the mid AV groove circumflex with excellent collateralization of the distal marginal and distal circumflex from the apical LAD; 90% stenosis of the RCA in its midsegment before large RV marginal branch with total occlusion of the mid RCA immediately after the marginal branch.  There is retrograde collateralization to this large RV marginal branch from very proximal vessel and this large RV marginal branch also distally collateralizes the distal RCA.  RECOMMENDATION: Surgical consultation will be obtained for CABG revascularization surgery.    1. Median Sternotomy 2. Extracorporeal circulation 3.   Coronary artery bypass grafting x  4   Left internal mammary graft to the LAD  SVG to diagonal  SVG to AM  SVG to PDA 4.   Endoscopic vein harvest from the right leg by Dr. Cyndia Bent on 05/09/2017.  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 They resolved quickly with rest. He saw [name] and had an echo that was normal. An ETT on 04/25/2017 showed abnormal 2 mm ST depression in the inferior leads and lead 1 with peak exercise. He had a cardiac cath today that shows a high grade proximal LAD stenosis after D1. The LCX system has two moderate caliber OM branches and then the small distal branch is occluded. The RCA has a large AM branch and then is occluded with the AM supplying collaterals to the PDA. LV function is preserved.  This 61 year old gentleman has severe multi-vessel coronary artery disease with an abnormal ETT and recent onset of intermittent exertional chest pressure. Dr. Cyndia Bent has personally reviewed his cath and echo studies. He has a high grade proximal LAD stenosis and occlusion of the proximal to mid RCA with collateral to the PDA from a large AM. His LV function is normal. Dr. Cyndia Bent agreed that CABG is the best treatment to resolve his symptoms and prevent MI in this active young patient. Pre operative carotid duplex showed no significant internal carotid artery  stenosis bilaterally. Patient underwent a CABG x 4 on 05/09/2017.  Brief Hospital Course:  The patient was extubated the evening of surgery without difficulty. He/she remained afebrile and hemodynamically stable. Gordy Councilman, a line, chest tubes, and foley were removed early in the post operative course. Lopressor was started and titrated accordingly. He was volume over loaded and diuresed. He had  ABL anemia. He did not require a post op transfusion. Last H and H was 11.2 and 34.9 . He was weaned off the insulin drip. The patient's HGA1C pre op was 5.4. The patient was felt surgically stable for transfer from the ICU to PCTU for further convalescence on 05/11/2017. He continues to progress with cardiac rehab. He was ambulating on room air. He has been tolerating a diet and has had a bowel movement. Epicardial pacing wires were removed on 05/12/2017. Chest tube sutures will be removed in the office after discharge. The patient is felt surgically stable for discharge today.   Latest Vital Signs: Blood pressure 136/74, pulse 80, temperature 97.8 F (36.6 C), temperature source Oral, resp. rate 18, height 5\' 9"  (1.753 m), weight 97.9 kg (215 lb 12.8 oz), SpO2 97 %.  Physical Exam: Cardiovascular: Slightly tachycardic Pulmonary: Slightly diminshed at bases Abdomen: Soft, non tender, bowel sounds present. Extremities: Mild bilateral lower extremity edema. Wounds: Sternal dressing removed. Wound is clean and dry.  No erythema or signs of infection.  Discharge Condition:Stable and discharged to home.  Recent laboratory studies:  Lab Results  Component Value Date   WBC 15.5 (H) 05/11/2017   HGB 11.2 (L) 05/11/2017   HCT 34.9 (L) 05/11/2017   MCV 86.8 05/11/2017   PLT 176 05/11/2017   Lab Results  Component Value Date   NA 137 05/11/2017   K 3.4 (L) 05/11/2017   CL 99 (L) 05/11/2017   CO2 29 05/11/2017   CREATININE 0.86 05/11/2017   GLUCOSE 100 (H) 05/11/2017    Diagnostic Studies: Dg Chest Port 1 View  Result Date: 05/11/2017 CLINICAL DATA:  Sore chest.  CABG. EXAM: PORTABLE CHEST 1 VIEW COMPARISON:  Yesterday FINDINGS: Swan-Ganz removed. Introducer in the right jugular vein remains in place. Left chest tubes removed. No ensuing pneumothorax. Low volumes. Bibasilar atelectasis. No sign of pulmonary edema. IMPRESSION: Left chest tube removal without pneumothorax. Bibasilar  atelectasis No sign of CHF. Electronically Signed   By: Marybelle Killings M.D.   On: 05/11/2017 07:28  Discharge Instructions    Amb Referral to Cardiac Rehabilitation    Complete by:  As directed    Diagnosis:  CABG   CABG X ___:  4   Discharge patient    Complete by:  As directed    Discharge disposition:  01-Home or Self Care   Discharge patient date:  05/13/2017     Discharge Medications: Allergies as of 05/13/2017      Reactions   [name] [lidocaine] Rash   Anything ending in (-caine) per patient       Medication List    STOP taking these medications   naproxen sodium 220 MG tablet Commonly known as:  ANAPROX   nitroGLYCERIN 0.4 MG SL tablet Commonly known as:  NITROSTAT     TAKE these medications   aspirin 325 MG EC tablet Take 1 tablet (325 mg total) by mouth daily.   atorvastatin 80 MG tablet Commonly known as:  LIPITOR Take 1 tablet (80 mg total) by mouth daily at 6 PM. What changed:  medication strength  how much to take  when to take this   metoprolol tartrate 25 MG tablet Commonly known as:  LOPRESSOR Take 1 tablet (25 mg total) by mouth 2 (two) times daily.   oxyCODONE 5 MG immediate release tablet Commonly known as:  Oxy IR/ROXICODONE Take 1 tablet (5 mg total) by mouth every 4 (four) hours as needed for severe pain.      The patient has been discharged on:   1.Beta Blocker:  Yes [  x ]                              No   [   ]                              If No, reason:  2.Ace Inhibitor/ARB: Yes [   ]                                     No  [  x  ]                                     If No, reason: titration of BB  3.Statin:   Yes [ x  ]                  No  [   ]                  If No, reason:  4.Ecasa:  Yes  [  x ]                  No   [   ]                  If No, reason:  Follow Up Appointments: Follow-up Information    Gaye Pollack, MD Follow up on 06/14/2017.   Specialty:  Cardiothoracic Surgery Why:  PA/LAT CXR to be taken  (at [facility] which is in the same building as Dr. Vivi Martens office) on 06/21/2017 at 10:30 am;Appointment time is at 11:00 am Contact information: [address] [place] [address] [phone]        Nurse Follow up on 05/23/2017.   Why:  Appointment is for chest tube suture removal only. Appointment time is at 10:00 am Contact information: [address] [place] [address]       Charlie Pitter, PA-C Follow up on 06/06/2017.   Specialties:  Cardiology, Radiology Why:  Appointment time is at 2:30 pm Contact information: [address] [place] [address] [phone]           Signed: [place] 05/13/2017, 7:45 AM

## 2017-05-12 NOTE — Progress Notes (Signed)
Epicardial pacing wires removed per MD orders.  Pt tolerated well, VSS, bedrest until 1400.

## 2017-05-12 NOTE — Progress Notes (Signed)
CARDIAC REHAB PHASE I   PRE:  Rate/Rhythm: 92 SR  BP:  Supine:   Sitting: 110/70  Standing:    SaO2: 95%RA  MODE:  Ambulation: 600 ft   POST:  Rate/Rhythm: 89  BP:  Supine:   Sitting: 124/80  Standing:    SaO2: 97%RA 1121-1212 Pt walked 600 ft with hand held asst with steady gait. Does not need walker for home. Cut walk short as pt had visitors. Ed was completed with pt and sister prior to walk. Discussed sternal precautions, heart healthy diet, ex ed and IS use. Discussed CRP 2 and will refer to [facility]. If pt decides he would like to go to [facility], he will discuss with cardiologist. Wrote down how to view discharge video. Understanding voiced of ed.    Graylon Good, RN BSN  05/12/2017 12:12 PM

## 2017-05-13 MED ORDER — ATORVASTATIN CALCIUM 80 MG PO TABS: 80.0000 mg | ORAL_TABLET | Freq: Every day | ORAL | 1 refills | Status: DC

## 2017-05-13 MED ORDER — METOPROLOL TARTRATE 25 MG PO TABS: 25.0000 mg | ORAL_TABLET | Freq: Two times a day (BID) | ORAL | 1 refills | Status: DC

## 2017-05-13 MED ORDER — OXYCODONE HCL 5 MG PO TABS: 5.0000 mg | ORAL_TABLET | ORAL | 0 refills | Status: DC | PRN

## 2017-05-13 MED ORDER — ASPIRIN 325 MG PO TBEC: 325.0000 mg | DELAYED_RELEASE_TABLET | Freq: Every day | ORAL | 0 refills | Status: DC

## 2017-05-13 NOTE — Discharge Instructions (Signed)

## 2017-05-13 NOTE — Progress Notes (Signed)
4 Days Post-Op Procedure(s) (LRB): CORONARY ARTERY BYPASS GRAFTING times four using left internal mammary artery and right leg saphenous vein   (N/A) TRANSESOPHAGEAL ECHOCARDIOGRAM (TEE) (N/A) Subjective:  No complaints Had BM last night Walking well  Objective: Vital signs in last 24 hours: Temp:  [97.8 F (36.6 C)-98.5 F (36.9 C)] 97.8 F (36.6 C) (06/30 0544) Pulse Rate:  [80-104] 80 (06/30 0544) Cardiac Rhythm: Normal sinus rhythm (06/30 0700) Resp:  [18] 18 (06/30 0544) BP: (98-136)/(45-74) 136/74 (06/30 0544) SpO2:  [97 %-98 %] 97 % (06/30 0544) Weight:  [97.9 kg (215 lb 12.8 oz)] 97.9 kg (215 lb 12.8 oz) (06/30 0544)  Hemodynamic parameters for last 24 hours:    Intake/Output from previous day: No intake/output data recorded. Intake/Output this shift: No intake/output data recorded.  General appearance: alert and cooperative Neurologic: intact Heart: regular rate and rhythm, S1, S2 normal, no murmur, click, rub or gallop Lungs: clear to auscultation bilaterally Extremities: extremities normal, atraumatic, no cyanosis or edema Wound: incisions look good  Lab Results:  Recent Labs  05/10/17 1619 05/10/17 1625 05/11/17 0509  WBC 15.8*  --  15.5*  HGB 11.9* 11.6* 11.2*  HCT 36.3* 34.0* 34.9*  PLT 191  --  176   BMET:  Recent Labs  05/10/17 1625 05/11/17 0509  NA 139 137  K 3.6 3.4*  CL 97* 99*  CO2  --  29  GLUCOSE 131* 100*  BUN 16 14  CREATININE 0.90 0.86  CALCIUM  --  7.6*    PT/INR: No results for input(s): LABPROT, INR in the last 72 hours. ABG    Component Value Date/Time   PHART 7.356 05/09/2017 1834   HCO3 24.0 05/09/2017 1834   TCO2 28 05/10/2017 1625   ACIDBASEDEF 1.0 05/09/2017 1834   O2SAT 93.0 05/09/2017 1834   CBG (last 3)   Recent Labs  05/11/17 0000 05/11/17 0349 05/11/17 0824  GLUCAP 110* 112* 97    Assessment/Plan: S/P Procedure(s) (LRB): CORONARY ARTERY BYPASS GRAFTING times four using left internal mammary  artery and right leg saphenous vein   (N/A) TRANSESOPHAGEAL ECHOCARDIOGRAM (TEE) (N/A)  POD 4 He is doing well and has had no arrhythmias postop. Ambulating well, off oxygen, bowels working. Plan to sent home today.   LOS: 5 days    Dominic Ray 05/13/2017

## 2017-05-16 MED FILL — Dexmedetomidine HCl in NaCl 0.9% IV Soln 400 MCG/100ML: INTRAVENOUS | Qty: 100 | Status: AC

## 2017-05-18 ENCOUNTER — Telehealth (HOSPITAL_COMMUNITY): Payer: Self-pay

## 2017-05-18 NOTE — Telephone Encounter (Addendum)
Patient insurance is active and benefits verified. Patient has BCBS - no co-payment, deductible $2700/$2700 has been met, out of pocket $5000/$3770 has been met, 50% co-insurance, no pre-authorization and no limit on visit. Passport/reference [phone].  Patient will be contacted and scheduled after their follow up appointment with the cardiologist on 06/06/17 and surgeon on 06/21/17, upon review by [facility] RN navigator.

## 2017-05-23 ENCOUNTER — Encounter (INDEPENDENT_AMBULATORY_CARE_PROVIDER_SITE_OTHER): Payer: Self-pay

## 2017-05-23 ENCOUNTER — Telehealth (HOSPITAL_COMMUNITY): Payer: Self-pay | Admitting: Physician Assistant

## 2017-05-23 DIAGNOSIS — Z4802 Encounter for removal of sutures: Secondary | ICD-10-CM

## 2017-05-23 NOTE — Telephone Encounter (Signed)
      [address]       [place] [address]             [phone]    Dominic Ray [id number]   S/P CABG x 4 performed on 05/09/2017.  Discharged home on 6/30. I attempted to call Dominic Ray at 10:26am on 7/10. Will attempt to call at a later date.   Medications: Current Outpatient Prescriptions on File Prior to Visit  Medication Sig Dispense Refill  . aspirin EC 325 MG EC tablet Take 1 tablet (325 mg total) by mouth daily. 30 tablet 0  . atorvastatin (LIPITOR) 80 MG tablet Take 1 tablet (80 mg total) by mouth daily at 6 PM. 30 tablet 1  . metoprolol tartrate (LOPRESSOR) 25 MG tablet Take 1 tablet (25 mg total) by mouth 2 (two) times daily. 30 tablet 1  . oxyCODONE (OXY IR/ROXICODONE) 5 MG immediate release tablet Take 1 tablet (5 mg total) by mouth every 4 (four) hours as needed for severe pain. 30 tablet 0   No current facility-administered medications on file prior to visit.     Coumadin:  INR check Yes/No   Follow up Appointment:   Gaye Pollack, MD Follow up on 06/14/2017.   Specialty:  Cardiothoracic Surgery Why:  PA/LAT CXR to be taken (at [facility] which is in the same building as Dr. Vivi Martens office) on 06/21/2017 at 10:30 am;Appointment time is at 11:00 am Contact information: [address] [place] [address] [phone]   Tessa Conte, PA-C

## 2017-06-05 ENCOUNTER — Encounter: Payer: Self-pay | Admitting: Physician Assistant

## 2017-06-05 NOTE — Progress Notes (Signed)
Cardiology Office Note    Date:  06/06/2017  ID:  Dominic Ray, DOB March 13, 1956, MRN [id number] PCP:  Silverio Decamp, MD  Cardiologist:  [name]   Chief Complaint: f/u CABG  History of Present Illness:  Dominic Ray is a 61 y.o. male with history of HTN, HLD, recently diagnosed multivessel CAD s/p CABG who presents back for follow-up.  He had originally seen [name] 03/2017 with extremely brief episodes of chest tightness (once on a sales call and another time while playing softball for just a few seconds). 2D Echo 04/12/17 showed mild LVH, EF 60-65%, no RWMA, normal diastolic parameters, mild LAE. ETT 04/25/17 was abnormal with 2 mm ST depression in inferior leads and lead 1 beginning at peak exercise. He underwent LHC 05/08/17 demonstrating significant multivesel CAD, EF 50-55% and subsequently underwent CABG 05/09/17 with LIMA-LAD, SVG-diag, SVG-AM, SVG-PDA. Post-op course uncomplicated, expected ABL anemia, diuresed for volume overload. Last labs showed WBC 15.5, Hgb 11.2, plt 176, K 3.4, Cr 0.86. LDL was 124 on 05/01/17 with normal LFTs (statin titrated to 80mg  inpatient)  He returns for follow-up doing great. He only had to take 2 Tylenol in the last 2 weeks, has not needed stronger pain medicine. No chest pain or dyspnea. No nausea, vomiting, orthopnea. Has trace LEE at right ankle. Reports good UOP the last few weeks. Has gradually been building up his walking program to twice a day and has felt fine doing so. No issues with surgical sites. In talking to family members he's now realizing he had more extensive family history of CAD than he originally thought. Brings in a notepad of questions regarding his condition.    Past Medical History:  Diagnosis Date  . Basal cell carcinoma of nasal tip 08/24/2012  . Coronary artery disease    a. Abnl stress test -> s/p CABGx4 in 04/2017 with LIMA-LAD, SVG-diag, SVG-AM, SVG-PDA.  Mar[place] Kitchen Hyperlipidemia   . Hypertension   . Male hypogonadism  08/24/2012  . Onychomycosis 08/24/2012    Past Surgical History:  Procedure Laterality Date  . CARDIAC CATHETERIZATION  05/08/2017  . CARDIOVASCULAR STRESS TEST  04/2017  . CORONARY ARTERY BYPASS GRAFT N/A 05/09/2017   Procedure: CORONARY ARTERY BYPASS GRAFTING times four using left internal mammary artery and right leg saphenous vein  ;  Surgeon: Gaye Pollack, MD;  Location: [facility];  Service: Open Heart Surgery;  Laterality: N/A;  . LEFT HEART CATH AND CORONARY ANGIOGRAPHY N/A 05/08/2017   Procedure: Left Heart Cath and Coronary Angiography;  Surgeon: Troy Sine, MD;  Location: [facility];  Service: Cardiovascular;  Laterality: N/A;  . TEE WITHOUT CARDIOVERSION N/A 05/09/2017   Procedure: TRANSESOPHAGEAL ECHOCARDIOGRAM (TEE);  Surgeon: Gaye Pollack, MD;  Location: [facility];  Service: Open Heart Surgery;  Laterality: N/A;  . TONSILLECTOMY  1968    Current Medications: Current Meds  Medication Sig  . aspirin EC 325 MG EC tablet Take 1 tablet (325 mg total) by mouth daily.  [place] Kitchen atorvastatin (LIPITOR) 80 MG tablet Take 1 tablet (80 mg total) by mouth daily at 6 PM.  . metoprolol tartrate (LOPRESSOR) 25 MG tablet Take 1 tablet (25 mg total) by mouth 2 (two) times daily.  [place] Kitchen oxyCODONE (OXY IR/ROXICODONE) 5 MG immediate release tablet Take 1 tablet (5 mg total) by mouth every 4 (four) hours as needed for severe pain.     Allergies:   Patient has no active allergies.   Social History   Social History  .  Marital status: Married    Spouse name: N/A  . Number of children: N/A  . Years of education: N/A   Occupational History  .      Unemployed   Social History Main Topics  . Smoking status: Never Smoker  . Smokeless tobacco: Never Used  . Alcohol use Yes     Comment: Occasional  . Drug use: No  . Sexual activity: Not Asked   Other Topics Concern  . None   Social History Narrative  . None     Family History:  Family History  Problem Relation Age of Onset  . CAD  Father        Died of MI at age 24  . Cancer Mother   . Colon cancer Neg Hx   . Stomach cancer Neg Hx     ROS:   Please see the history of present illness.  All other systems are reviewed and otherwise negative.    PHYSICAL EXAM:   VS:  BP 112/60   Pulse 64   Ht 5\' 9"  (1.753 m)   Wt 206 lb 12.8 oz (93.8 kg)   SpO2 98%   BMI 30.54 kg/m   BMI: Body mass index is 30.54 kg/m. GEN: Well nourished, well developed well appearing WM, in no acute distress  HEENT: normocephalic, atraumatic Neck: no JVD, carotid bruits, or masses Cardiac: RRR; no murmurs, rubs, or gallops, no edema  Respiratory:  clear to auscultation bilaterally, normal work of breathing GI: soft, nontender, nondistended, + BS MS: no deformity or atrophy  Skin: warm and dry, no rash, surgical scars (sternal sites, endoscopic sites, harvest) all appear to be healing well without suppuration, dehiscence or abnormal erythema Neuro:  Alert and Oriented x 3, Strength and sensation are intact, follows commands Psych: euthymic mood, full affect  Wt Readings from Last 3 Encounters:  06/06/17 206 lb 12.8 oz (93.8 kg)  05/13/17 215 lb 12.8 oz (97.9 kg)  05/01/17 220 lb 9.6 oz (100.1 kg)      Studies/Labs Reviewed:   EKG:  EKG was ordered today and personally reviewed by me and demonstrates NSR 64bpm, posisble prior inferior infarct, nonspeciifc ST-T changes  Recent Labs: 05/01/2017: ALT 18 05/10/2017: Magnesium 2.0 05/11/2017: BUN 14; Creatinine, Ser 0.86; Hemoglobin 11.2; Platelets 176; Potassium 3.4; Sodium 137   Lipid Panel    Component Value Date/Time   CHOL 203 (H) 05/01/2017 0000   TRIG 176 (H) 05/01/2017 0000   HDL 44 05/01/2017 0000   CHOLHDL 4.6 05/01/2017 0000   CHOLHDL 5.5 12/06/2012 0857   VLDL 36 12/06/2012 0857   LDLCALC 124 (H) 05/01/2017 0000    Additional studies/ records that were reviewed today include: Summarized above.    ASSESSMENT & PLAN:   1. CAD s/p CABGx4 - doing great post-CABG.  Tried to answer questions to the best of my ability. There were two that I asked him to discuss with Dr. Cyndia Bent  (1) he inquired about sleeping in different positions - states he was told he could at this point, and wonders about belly sleeping. 2) He also asked about whether or not he is allowed to pull open car doors, thinks he may have been told something about this.) Otherwise he is already aware of lifting restrictions. Continue ASA, BB, statin. Will defer to surgeon timing of reducing aspirin to 81mg  daily. Trace RLE appears to be part of normal healing process. No significant RLE erythema or pain. He is keeping his legs elevated when seated as  much as possible. 2. Essential HTN - controlled on metoprolol. 3. Hyperlipidemia - has been on higher dose atorvastatin for 4 weeks. Recheck LFTs/lipids in 2 weeks. 4. Hypokalemia - last BMET at hospital showed this. Suspect this was due to diuresis in the hospital. Recheck today to trend.  Disposition: F/u with Dr. Melynda Ripple team APP in 6 weeks.   Medication Adjustments/Labs and Tests Ordered: Current medicines are reviewed at length with the patient today.  Concerns regarding medicines are outlined above. Medication changes, Labs and Tests ordered today are summarized above and listed in the Patient Instructions accessible in Encounters.   Signed, Charlie Pitter, PA-C  06/06/2017 3:11 PM    [facility] [place], [place], [place]  [address] Phone: [phone]; Fax: [phone]

## 2017-06-06 ENCOUNTER — Encounter: Payer: Self-pay | Admitting: Physician Assistant

## 2017-06-06 ENCOUNTER — Ambulatory Visit (INDEPENDENT_AMBULATORY_CARE_PROVIDER_SITE_OTHER): Payer: BLUE CROSS/BLUE SHIELD | Admitting: Physician Assistant

## 2017-06-06 ENCOUNTER — Telehealth: Payer: Self-pay | Admitting: Physician Assistant

## 2017-06-06 VITALS — BP 112/60 | HR 64 | Ht 69.0 in | Wt 206.8 lb

## 2017-06-06 DIAGNOSIS — I10 Essential (primary) hypertension: Secondary | ICD-10-CM | POA: Diagnosis not present

## 2017-06-06 DIAGNOSIS — I2510 Atherosclerotic heart disease of native coronary artery without angina pectoris: Secondary | ICD-10-CM | POA: Diagnosis not present

## 2017-06-06 DIAGNOSIS — E785 Hyperlipidemia, unspecified: Secondary | ICD-10-CM | POA: Diagnosis not present

## 2017-06-06 DIAGNOSIS — E876 Hypokalemia: Secondary | ICD-10-CM | POA: Diagnosis not present

## 2017-06-06 DIAGNOSIS — Z951 Presence of aortocoronary bypass graft: Secondary | ICD-10-CM | POA: Diagnosis not present

## 2017-06-06 NOTE — Patient Instructions (Addendum)
Medication Instructions:  Your physician recommends that you continue on your current medications as directed. Please refer to the Current Medication list given to you today.   Labwork: TODAY:  BMET 2 WEEKS:  FASTING LIPID & LFT  Testing/Procedures: None ordered  Follow-Up: Your physician recommends that you schedule a follow-up appointment in: [facility] DR. CRENSHAW OR AN APP ON HIS CARE TEAM ON A DAY WHEN DR. CRENSHAW IS IN THE OFFICE   Any Other Special Instructions Will Be Listed Below (If Applicable).     If you need a refill on your cardiac medications before your next appointment, please call your pharmacy.

## 2017-06-06 NOTE — Telephone Encounter (Signed)
Returned pts call and let him know that this was a f/u from his recent CABG and that we would repeat EKG. Pt wanted to make sure Melina Copa, PA-C, could answer some questions for him.  Pt was advised that she could. Pt thanked me for my call and he will be at his appt.

## 2017-06-06 NOTE — Telephone Encounter (Signed)
New message    Pt is calling asking what is going to happen at his appt today.

## 2017-06-07 ENCOUNTER — Telehealth: Payer: Self-pay | Admitting: Physician Assistant

## 2017-06-07 LAB — BASIC METABOLIC PANEL
BUN/Creatinine Ratio: 15 (ref 10–24)
BUN: 14 mg/dL (ref 8–27)
CO2: 24 mmol/L (ref 20–29)
Calcium: 9.2 mg/dL (ref 8.6–10.2)
Chloride: 102 mmol/L (ref 96–106)
Creatinine, Ser: 0.96 mg/dL (ref 0.76–1.27)
GFR calc Af Amer: 99 mL/min/{1.73_m2} (ref >59)
GFR calc non Af Amer: 86 mL/min/{1.73_m2} (ref >59)
Glucose: 88 mg/dL (ref 65–99)
Potassium: 5.1 mmol/L (ref 3.5–5.2)
Sodium: 141 mmol/L (ref 134–144)

## 2017-06-07 NOTE — Telephone Encounter (Signed)
New message ° ° °Pt is calling returning call about lab results. °

## 2017-06-07 NOTE — Telephone Encounter (Signed)
Returned pts call and discussed his lab results. See result note. 

## 2017-06-07 NOTE — Telephone Encounter (Signed)
-----   Message from Charlie Pitter, [place] sent at 06/07/2017  7:54 AM EDT ----- Please let patient know labs were normal. Potassium is now fine, actually upper limits of normal. Melina Copa PA-C

## 2017-06-11 ENCOUNTER — Other Ambulatory Visit: Payer: Self-pay | Admitting: Physician Assistant

## 2017-06-12 ENCOUNTER — Other Ambulatory Visit: Payer: Self-pay | Admitting: Physician Assistant

## 2017-06-12 ENCOUNTER — Telehealth: Payer: Self-pay | Admitting: Cardiology

## 2017-06-12 NOTE — Telephone Encounter (Signed)
New message     *STAT* If patient is at the pharmacy, call can be transferred to refill team.   1. Which medications need to be refilled? (please list name of each medication and dose if known)   aspirin EC 325 MG EC tablet Take 1 tablet (325 mg total) by mouth daily.    atorvastatin (LIPITOR) 80 MG tablet Take 1 tablet (80 mg total) by mouth daily at 6 PM.   metoprolol tartrate (LOPRESSOR) 25 MG tablet Take 1 tablet (25 mg total) by mouth 2 (two) times daily.     2. Which pharmacy/location (including street and city if local pharmacy) is medication to be sent to? [facility]   3. Do they need a 30 day or 90 day supply?  [place]

## 2017-06-13 ENCOUNTER — Encounter: Payer: Self-pay | Admitting: Cardiology

## 2017-06-13 MED ORDER — METOPROLOL TARTRATE 25 MG PO TABS: 25.0000 mg | ORAL_TABLET | Freq: Two times a day (BID) | ORAL | 12 refills | Status: DC

## 2017-06-13 MED ORDER — ATORVASTATIN CALCIUM 80 MG PO TABS: 80.0000 mg | ORAL_TABLET | Freq: Every day | ORAL | 12 refills | Status: DC

## 2017-06-13 NOTE — Telephone Encounter (Signed)
Refill sent to the pharmacy electronically.  

## 2017-06-13 NOTE — Telephone Encounter (Signed)
Please call,question about denial of his refill.

## 2017-06-13 NOTE — Telephone Encounter (Signed)
This encounter was created in error - please disregard.

## 2017-06-20 ENCOUNTER — Other Ambulatory Visit: Payer: Self-pay | Admitting: Surgery

## 2017-06-20 DIAGNOSIS — Z951 Presence of aortocoronary bypass graft: Secondary | ICD-10-CM

## 2017-06-21 ENCOUNTER — Encounter: Payer: Self-pay | Admitting: Surgery

## 2017-06-21 ENCOUNTER — Telehealth: Payer: Self-pay | Admitting: Cardiology

## 2017-06-21 ENCOUNTER — Ambulatory Visit (INDEPENDENT_AMBULATORY_CARE_PROVIDER_SITE_OTHER): Payer: Self-pay | Admitting: Surgery

## 2017-06-21 ENCOUNTER — Ambulatory Visit
Admission: RE | Admit: 2017-06-21 | Discharge: 2017-06-21 | Disposition: A | Discharge status: Home / Self Care | Payer: BLUE CROSS/BLUE SHIELD | Source: Ambulatory Visit | Attending: Surgery | Admitting: Surgery

## 2017-06-21 VITALS — BP 124/73 | HR 53 | Resp 20 | Ht 69.0 in | Wt 202.0 lb

## 2017-06-21 DIAGNOSIS — Z951 Presence of aortocoronary bypass graft: Secondary | ICD-10-CM

## 2017-06-21 DIAGNOSIS — J90 Pleural effusion, not elsewhere classified: Secondary | ICD-10-CM | POA: Diagnosis not present

## 2017-06-21 NOTE — Telephone Encounter (Signed)
New message    Epic computers down on  8/8   Patient calling questions about lab work

## 2017-06-22 ENCOUNTER — Encounter: Payer: Self-pay | Admitting: Surgery

## 2017-06-22 ENCOUNTER — Other Ambulatory Visit: Payer: BLUE CROSS/BLUE SHIELD | Admitting: *Deleted

## 2017-06-22 DIAGNOSIS — I10 Essential (primary) hypertension: Secondary | ICD-10-CM

## 2017-06-22 DIAGNOSIS — I2510 Atherosclerotic heart disease of native coronary artery without angina pectoris: Secondary | ICD-10-CM

## 2017-06-22 DIAGNOSIS — E876 Hypokalemia: Secondary | ICD-10-CM

## 2017-06-22 DIAGNOSIS — Z951 Presence of aortocoronary bypass graft: Secondary | ICD-10-CM

## 2017-06-22 DIAGNOSIS — E785 Hyperlipidemia, unspecified: Secondary | ICD-10-CM | POA: Diagnosis not present

## 2017-06-22 LAB — HEPATIC FUNCTION PANEL
ALT: 13 IU/L (ref 0–44)
AST: 14 IU/L (ref 0–40)
Albumin: 3.8 g/dL (ref 3.6–4.8)
Alkaline Phosphatase: 103 IU/L (ref 39–117)
Bilirubin Total: 0.6 mg/dL (ref 0.0–1.2)
Bilirubin, Direct: 0.19 mg/dL (ref 0.00–0.40)
Total Protein: 6.5 g/dL (ref 6.0–8.5)

## 2017-06-22 LAB — LIPID PANEL
Chol/HDL Ratio: 2.2 ratio (ref 0.0–5.0)
Cholesterol, Total: 68 mg/dL — ABNORMAL LOW (ref 100–199)
HDL: 31 mg/dL — ABNORMAL LOW (ref >39)
LDL Calculated: 26 mg/dL (ref 0–99)
Triglycerides: 55 mg/dL (ref 0–149)
VLDL Cholesterol Cal: 11 mg/dL (ref 5–40)

## 2017-06-22 NOTE — Telephone Encounter (Signed)
Spoke to pt. He stated he called yesterday. He was not sure how long he needed to fast prior to having lipid and LFT checked. Pt stated he had the lab work done this morning, was not able to reach anyone when he called.  Told pt he was only supposed the fast after midnight the night before--nothing to eat or drink after midnight. Pt stated that is what he did.  Told pt we will call when those results come in. Pt verbalized thanks.

## 2017-06-22 NOTE — Progress Notes (Signed)
     HPI: Patient returns for routine postoperative follow-up having undergone CABG x 4  on 05/09/2017. The patient's early postoperative recovery while in the hospital was notable for an uncomplicated postop course. Since hospital discharge the patient reports that he has been feeling well. He is walking without chest pain or shortness of breath. He has been watching his diet and has lost some weight. He has a notepad full of questions   Current Outpatient Prescriptions  Medication Sig Dispense Refill  . aspirin EC 325 MG EC tablet Take 1 tablet (325 mg total) by mouth daily. 30 tablet 0  . atorvastatin (LIPITOR) 80 MG tablet Take 1 tablet (80 mg total) by mouth daily at 6 PM. 30 tablet 12  . metoprolol tartrate (LOPRESSOR) 25 MG tablet Take 1 tablet (25 mg total) by mouth 2 (two) times daily. 60 tablet 12  . oxyCODONE (OXY IR/ROXICODONE) 5 MG immediate release tablet Take 1 tablet (5 mg total) by mouth every 4 (four) hours as needed for severe pain. 30 tablet 0   No current facility-administered medications for this visit.     Physical Exam: BP 124/73   Pulse (!) 53   Resp 20   Ht 5\' 9"  (1.753 m)   Wt 202 lb (91.6 kg)   SpO2 97% Comment: RA  BMI 29.83 kg/m  He looks well. Lung exam is clear. Cardiac exam shows a regular rate and rhythm with normal heart sounds. Chest incision is healing well and sternum is stable. The leg incisions are healing well and there is no peripheral edema.    Diagnostic Tests:  CLINICAL DATA:  61 year old male status post CABG in June.  EXAM: CHEST  2 VIEW  COMPARISON:  05/11/2017 and earlier.  FINDINGS: Right IJ introducer sheath removed since the prior portable film. Sequelae of sternotomy and CABG. Stable lung volumes compared to the preoperative films. Mediastinal contours are stable. Visualized tracheal air column is within normal limits.  No pneumothorax or pulmonary edema.  No confluent pulmonary opacity.  Minimal blunting of  both posterior costophrenic angles.  Negative visible bowel gas pattern. No acute osseous abnormality identified.  IMPRESSION: Trace if any residual pleural effusions. No other acute cardiopulmonary abnormality.   Electronically Signed   By: Genevie Ann M.D.   On: 06/21/2017 10:54   Impression:  Overall I think he is doing well. I encouraged him to continue walking. He is planning to participate in cardiac rehab. I told him he could drive his car but should not lift anything heavier than 10 lbs for three months postop.   Plan:  He will continue to follow up with [name] and will contact me if he has any problems with his incisions.   Gaye Pollack, MD [facility] [phone]

## 2017-06-23 ENCOUNTER — Telehealth: Payer: Self-pay | Admitting: *Deleted

## 2017-06-23 NOTE — Telephone Encounter (Signed)
Lmtcb to go over lab results 

## 2017-06-23 NOTE — Telephone Encounter (Signed)
-----   Message from Erma Heritage, [place] sent at 06/22/2017  7:53 PM EDT ----- Covering for [facility]na - Please let the patient know his cholesterol levels have significantly improved with total cholesterol decreasing from 203 to 68 and LDL down from 124 to 26 within the past month. Continue current medication regimen. Thank you!

## 2017-06-23 NOTE — Telephone Encounter (Signed)
Pt has been notified of lab results by phone with verbal understanding. I went over pt's cholesterol number's with him. Pt had questions as to if he would need to stay on a statin for the rest of his life since he has had a CABG x 4 . I advised pt that would be up to the Cardiologist, however though usually a pt will stay on a statin. Pt asked if there was a way to check his cholesterol w/o having lab work and paying a copay. I answered that I did not know of any other way to check cholesterol w/o doing lab work. Pt asked how often will he need lab work done to check FLP. I advised pt that is up to the Cardiologist, though if no changes had been made, then usually checked once a year. If changes made with statin may re-check 6-8 week to 3 months after changes were made. Pt thanked me for my call today.

## 2017-06-23 NOTE — Telephone Encounter (Signed)
New message   Pt calling back about labs

## 2017-06-30 ENCOUNTER — Telehealth (HOSPITAL_COMMUNITY): Payer: Self-pay

## 2017-06-30 NOTE — Telephone Encounter (Signed)
Cardiac Rehab Medication Review by a Pharmacist  Does the patient  feel that his/her medications are working for him/her?  yes  Has the patient been experiencing any side effects to the medications prescribed?  Yes - dizziness with Metoprolol. Patient was instructed to get up from bed/chairs slowly, and stay hydrated. He was instructed to let us know if the dizziness continues as we can decrease his dose.   Does the patient measure his/her own blood pressure or blood glucose at home?  No - stated that it is always stable when he goes to his Dr's appointments.   Does the patient have any problems obtaining medications due to transportation or finances?   no    Understanding of regimen: good Understanding of indications: good Potential of compliance: good   Comments: Dominic Ray states that he had trouble filling his ASA 325 prescription at his [facility]. He has been taking an OTC version right now. He is in need of a new ASA 325 script within the next month. He was instructed to ask for a new ASA 325 prescription when he attends his cardiology appointment within the next few weeks.  Diana L. Kyung Rudd, PharmD, [place] PGY1 Pharmacy Resident Pager: [phone]

## 2017-07-05 ENCOUNTER — Telehealth (HOSPITAL_COMMUNITY): Payer: Self-pay

## 2017-07-05 NOTE — Telephone Encounter (Signed)
*  Updated insurance benefits* [employer] - no co-payment, deductible $2700/$2700 has been met, out of pocket $5000/$5000 has been met, no co-insurance and no pre-authorization. Passport/reference [phone] [phone].

## 2017-07-06 ENCOUNTER — Encounter (HOSPITAL_COMMUNITY): Payer: Self-pay

## 2017-07-06 ENCOUNTER — Encounter (HOSPITAL_COMMUNITY)
Admission: RE | Admit: 2017-07-06 | Discharge: 2017-07-06 | Disposition: A | Discharge status: Home / Self Care | Payer: BLUE CROSS/BLUE SHIELD | Source: Ambulatory Visit | Attending: Cardiology | Admitting: Cardiology

## 2017-07-06 VITALS — BP 138/70 | HR 57 | Ht 67.75 in | Wt 206.1 lb

## 2017-07-06 DIAGNOSIS — Z951 Presence of aortocoronary bypass graft: Secondary | ICD-10-CM

## 2017-07-06 NOTE — Progress Notes (Signed)
Cardiac Individual Treatment Plan  Patient Details  Name: Dominic Ray MRN: [id number] Date of Birth: July 07, 1956 Referring Provider:     CARDIAC REHAB PHASE II ORIENTATION from 07/06/2017 in [place]  Referring Provider  Kirk Ruths MD      Initial Encounter Date:    CARDIAC REHAB PHASE II ORIENTATION from 07/06/2017 in [place]  Date  07/06/17  Referring Provider  Kirk Ruths MD      Visit Diagnosis: 05/09/17 S/P CABG x 4  Patient's Home Medications on Admission:  Current Outpatient Prescriptions:  .  aspirin EC 325 MG EC tablet, Take 1 tablet (325 mg total) by mouth daily., Disp: 30 tablet, Rfl: 0 .  atorvastatin (LIPITOR) 80 MG tablet, Take 1 tablet (80 mg total) by mouth daily at 6 PM., Disp: 30 tablet, Rfl: 12 .  metoprolol tartrate (LOPRESSOR) 25 MG tablet, Take 1 tablet (25 mg total) by mouth 2 (two) times daily., Disp: 60 tablet, Rfl: 12 .  oxyCODONE (OXY IR/ROXICODONE) 5 MG immediate release tablet, Take 1 tablet (5 mg total) by mouth every 4 (four) hours as needed for severe pain. (Patient not taking: Reported on 06/30/2017), Disp: 30 tablet, Rfl: 0  Past Medical History: Past Medical History:  Diagnosis Date  . Basal cell carcinoma of nasal tip 08/24/2012  . Coronary artery disease    a. Abnl stress test -> s/p CABGx4 in 04/2017 with LIMA-LAD, SVG-diag, SVG-AM, SVG-PDA.  Marland Kitchen Hyperlipidemia   . Hypertension   . Male hypogonadism 08/24/2012  . Onychomycosis 08/24/2012    Tobacco Use: History  Smoking Status  . Never Smoker  Smokeless Tobacco  . Never Used    Labs: Recent Review Flowsheet Data    Labs for ITP Cardiac and Pulmonary Rehab Latest Ref Rng & Units 05/09/2017 05/09/2017 05/09/2017 05/10/2017 06/22/2017   Cholestrol 100 - 199 mg/dL - - - - 68(L)   LDLCALC 0 - 99 mg/dL - - - - 26   HDL >39 mg/dL - - - - 31(L)   Trlycerides 0 - 149 mg/dL - - - - 55   Hemoglobin A1c 4.8 - 5.6 % - - - - -    PHART 7.350 - 7.450 7.332(L) 7.356 - - -   PCO2ART 32.0 - 48.0 mmHg 47.5 43.1 - - -   HCO3 20.0 - 28.0 mmol/L 25.0 24.0 - - -   TCO2 0 - 100 mmol/L 26 25 25 28  -   ACIDBASEDEF 0.0 - 2.0 mmol/L 1.0 1.0 - - -   O2SAT % 98.0 93.0 - - -      Capillary Blood Glucose: Lab Results  Component Value Date   GLUCAP 97 05/11/2017   GLUCAP 112 (H) 05/11/2017   GLUCAP 110 (H) 05/11/2017   GLUCAP 131 (H) 05/10/2017   GLUCAP 157 (H) 05/10/2017     Exercise Target Goals: Date: 07/06/17  Exercise Program Goal: Individual exercise prescription set with THRR, safety & activity barriers. Participant demonstrates ability to understand and report RPE using BORG scale, to self-measure pulse accurately, and to acknowledge the importance of the exercise prescription.  Exercise Prescription Goal: Starting with aerobic activity 30 plus minutes a day, 3 days per week for initial exercise prescription. Provide home exercise prescription and guidelines that participant acknowledges understanding prior to discharge.  Activity Barriers & Risk Stratification:     Activity Barriers & Cardiac Risk Stratification - 07/06/17 0838      Activity Barriers & Cardiac Risk Stratification  Activity Barriers Arthritis;Back Problems   Cardiac Risk Stratification High      6 Minute Walk:     6 Minute Walk    Row Name 07/06/17 1000         6 Minute Walk   Phase Initial     Distance 18888 feet     Walk Time 6 minutes     # of Rest Breaks 0     MPH 3.68     METS 4.19     RPE 9     VO2 Peak 14.68     Symptoms No     Resting HR 57 bpm     Resting BP 138/70     Max Ex. HR 93 bpm     Max Ex. BP 134/82     2 Minute Post BP 124/70        Oxygen Initial Assessment:   Oxygen Re-Evaluation:   Oxygen Discharge (Final Oxygen Re-Evaluation):   Initial Exercise Prescription:     Initial Exercise Prescription - 07/06/17 1000      Date of Initial Exercise RX and Referring Provider   Date 07/06/17    Referring Provider Kirk Ruths MD     Treadmill   MPH 3.1   Grade 1   Minutes 10   METs 3.8     Bike   Level 1   Minutes 10   METs 2.99     NuStep   Level 3   SPM 80   Minutes 10   METs 2.5     Arm Ergometer   Level 2   Minutes 10   METs 2.5     Prescription Details   Frequency (times per week) 3   Duration Progress to 30 minutes of continuous aerobic without signs/symptoms of physical distress     Intensity   THRR 40-80% of Max Heartrate 64-128   Ratings of Perceived Exertion 11-13   Perceived Dyspnea 0-4     Progression   Progression Continue progressive overload as per policy without signs/symptoms or physical distress.     Resistance Training   Training Prescription Yes   Weight 4lbs   Reps 10-15      Perform Capillary Blood Glucose checks as needed.  Exercise Prescription Changes:   Exercise Comments:   Exercise Goals and Review:     Exercise Goals    Row Name 07/06/17 0839 07/06/17 1019           Exercise Goals   Increase Physical Activity Yes  -      Intervention Provide advice, education, support and counseling about physical activity/exercise needs.;Develop an individualized exercise prescription for aerobic and resistive training based on initial evaluation findings, risk stratification, comorbidities and participant's personal goals.  -      Expected Outcomes Achievement of increased cardiorespiratory fitness and enhanced flexibility, muscular endurance and strength shown through measurements of functional capacity and personal statement of participant.  -      Increase Strength and Stamina Yes  increase muscle tone and get stronger Yes  return to jogging, increase muscle tone      Intervention Provide advice, education, support and counseling about physical activity/exercise needs.;Develop an individualized exercise prescription for aerobic and resistive training based on initial evaluation findings, risk stratification,  comorbidities and participant's personal goals.  -      Expected Outcomes Achievement of increased cardiorespiratory fitness and enhanced flexibility, muscular endurance and strength shown through measurements of functional capacity and personal statement of participant.  -  Exercise Goals Re-Evaluation :    Discharge Exercise Prescription (Final Exercise Prescription Changes):   Nutrition:  Target Goals: Understanding of nutrition guidelines, daily intake of sodium 1500mg , cholesterol 200mg , calories 30% from fat and 7% or less from saturated fats, daily to have 5 or more servings of fruits and vegetables.  Biometrics:     Pre Biometrics - 07/06/17 1017      Pre Biometrics   Waist Circumference 39.5 inches   Hip Circumference 41 inches   Waist to Hip Ratio 0.96 %   Triceps Skinfold 19 mm   % Body Fat 29.5 %   Grip Strength 33 kg   Flexibility 14 in   Single Leg Stand 30 seconds       Nutrition Therapy Plan and Nutrition Goals:     Nutrition Therapy & Goals - 07/06/17 1224      Nutrition Therapy   Diet Therapeutic Lifestyle Changes     Personal Nutrition Goals   Nutrition Goal Pt to identify food quantities necessary to achieve weight loss of 6-12 lb at graduation from cardiac rehab. Goal wt of 188 lb desired.      Intervention Plan   Intervention Prescribe, educate and counsel regarding individualized specific dietary modifications aiming towards targeted core components such as weight, hypertension, lipid management, diabetes, heart failure and other comorbidities.   Expected Outcomes Short Term Goal: Understand basic principles of dietary content, such as calories, fat, sodium, cholesterol and nutrients.;Long Term Goal: Adherence to prescribed nutrition plan.      Nutrition Discharge: Nutrition Scores:     Nutrition Assessments - 07/06/17 1224      MEDFICTS Scores   Pre Score 6      Nutrition Goals Re-Evaluation:   Nutrition Goals  Re-Evaluation:   Nutrition Goals Discharge (Final Nutrition Goals Re-Evaluation):   Psychosocial: Target Goals: Acknowledge presence or absence of significant depression and/or stress, maximize coping skills, provide positive support system. Participant is able to verbalize types and ability to use techniques and skills needed for reducing stress and depression.  Initial Review & Psychosocial Screening:     Initial Psych Review & Screening - 07/06/17 1244      Initial Review   Current issues with Current Stress Concerns   Source of Stress Concerns Occupation;Financial   Comments Pt is not working-unemployed per vocational rehab intake form     [place]? Yes     Barriers   Psychosocial barriers to participate in program The patient should benefit from training in stress management and relaxation.     Screening Interventions   Interventions Encouraged to exercise      Quality of Life Scores:     Quality of Life - 07/06/17 1018      Quality of Life Scores   Health/Function Pre 23.3 %   Socioeconomic Pre 24.86 %   Psych/Spiritual Pre 23.29 %   Family Pre 25.5 %   GLOBAL Pre 23.89 %      PHQ-9: Recent Review Flowsheet Data    There is no flowsheet data to display.     Interpretation of Total Score  Total Score Depression Severity:  1-4 = Minimal depression, 5-9 = Mild depression, 10-14 = Moderate depression, 15-19 = Moderately severe depression, 20-27 = Severe depression   Psychosocial Evaluation and Intervention:   Psychosocial Re-Evaluation:   Psychosocial Discharge (Final Psychosocial Re-Evaluation):   Vocational Rehabilitation: Provide vocational rehab assistance to qualifying candidates.   Vocational Rehab Evaluation & Intervention:  Vocational Rehab - 07/06/17 1245      Initial Vocational Rehab Evaluation & Intervention   Assessment shows need for Vocational Rehabilitation Yes  pt indicated on his vocational  rehab form that he would like to speak to a counselor.   Pt works in [employer] of home [employer].      Education: Education Goals: Education classes will be provided on a weekly basis, covering required topics. Participant will state understanding/return demonstration of topics presented.  Learning Barriers/Preferences:     Learning Barriers/Preferences - 07/06/17 1610      Learning Barriers/Preferences   Learning Barriers Sight   Learning Preferences Written Material;Skilled Demonstration      Education Topics: Count Your Pulse:  -Group instruction provided by verbal instruction, demonstration, patient participation and written materials to support subject.  Instructors address importance of being able to find your pulse and how to count your pulse when at home without a heart monitor.  Patients get hands on experience counting their pulse with staff help and individually.   Heart Attack, Angina, and Risk Factor Modification:  -Group instruction provided by verbal instruction, video, and written materials to support subject.  Instructors address signs and symptoms of angina and heart attacks.    Also discuss risk factors for heart disease and how to make changes to improve heart health risk factors.   Functional Fitness:  -Group instruction provided by verbal instruction, demonstration, patient participation, and written materials to support subject.  Instructors address safety measures for doing things around the house.  Discuss how to get up and down off the floor, how to pick things up properly, how to safely get out of a chair without assistance, and balance training.   Meditation and Mindfulness:  -Group instruction provided by verbal instruction, patient participation, and written materials to support subject.  Instructor addresses importance of mindfulness and meditation practice to help reduce stress and improve awareness.  Instructor also leads participants through a  meditation exercise.    Stretching for Flexibility and Mobility:  -Group instruction provided by verbal instruction, patient participation, and written materials to support subject.  Instructors lead participants through series of stretches that are designed to increase flexibility thus improving mobility.  These stretches are additional exercise for major muscle groups that are typically performed during regular warm up and cool down.   Hands Only CPR:  -Group verbal, video, and participation provides a basic overview of AHA guidelines for community CPR. Role-play of emergencies allow participants the opportunity to practice calling for help and chest compression technique with discussion of AED use.   Hypertension: -Group verbal and written instruction that provides a basic overview of hypertension including the most recent diagnostic guidelines, risk factor reduction with self-care instructions and medication management.    Nutrition I class: Heart Healthy Eating:  -Group instruction provided by PowerPoint slides, verbal discussion, and written materials to support subject matter. The instructor gives an explanation and review of the Therapeutic Lifestyle Changes diet recommendations, which includes a discussion on lipid goals, dietary fat, sodium, fiber, plant stanol/sterol esters, sugar, and the components of a well-balanced, healthy diet.   Nutrition II class: Lifestyle Skills:  -Group instruction provided by PowerPoint slides, verbal discussion, and written materials to support subject matter. The instructor gives an explanation and review of label reading, grocery shopping for heart health, heart healthy recipe modifications, and ways to make healthier choices when eating out.   Diabetes Question & Answer:  -Group instruction provided by PowerPoint slides, verbal discussion, and  written materials to support subject matter. The instructor gives an explanation and review of diabetes  co-morbidities, pre- and post-prandial blood glucose goals, pre-exercise blood glucose goals, signs, symptoms, and treatment of hypoglycemia and hyperglycemia, and foot care basics.   Diabetes Blitz:  -Group instruction provided by PowerPoint slides, verbal discussion, and written materials to support subject matter. The instructor gives an explanation and review of the physiology behind type 1 and type 2 diabetes, diabetes medications and rational behind using different medications, pre- and post-prandial blood glucose recommendations and Hemoglobin A1c goals, diabetes diet, and exercise including blood glucose guidelines for exercising safely.    Portion Distortion:  -Group instruction provided by PowerPoint slides, verbal discussion, written materials, and food models to support subject matter. The instructor gives an explanation of serving size versus portion size, changes in portions sizes over the last 20 years, and what consists of a serving from each food group.   Stress Management:  -Group instruction provided by verbal instruction, video, and written materials to support subject matter.  Instructors review role of stress in heart disease and how to cope with stress positively.     Exercising on Your Own:  -Group instruction provided by verbal instruction, power point, and written materials to support subject.  Instructors discuss benefits of exercise, components of exercise, frequency and intensity of exercise, and end points for exercise.  Also discuss use of nitroglycerin and activating EMS.  Review options of places to exercise outside of rehab.  Review guidelines for sex with heart disease.   Cardiac Drugs I:  -Group instruction provided by verbal instruction and written materials to support subject.  Instructor reviews cardiac drug classes: antiplatelets, anticoagulants, beta blockers, and statins.  Instructor discusses reasons, side effects, and lifestyle considerations for each  drug class.   Cardiac Drugs II:  -Group instruction provided by verbal instruction and written materials to support subject.  Instructor reviews cardiac drug classes: angiotensin converting enzyme inhibitors (ACE-I), angiotensin II receptor blockers (ARBs), nitrates, and calcium channel blockers.  Instructor discusses reasons, side effects, and lifestyle considerations for each drug class.   Anatomy and Physiology of the Circulatory System:  Group verbal and written instruction and models provide basic cardiac anatomy and physiology, with the coronary electrical and arterial systems. Review of: AMI, Angina, Valve disease, Heart Failure, Peripheral Artery Disease, Cardiac Arrhythmia, Pacemakers, and the ICD.   Other Education:  -Group or individual verbal, written, or video instructions that support the educational goals of the cardiac rehab program.   Knowledge Questionnaire Score:     Knowledge Questionnaire Score - 07/06/17 1000      Knowledge Questionnaire Score   Pre Score 25/28      Core Components/Risk Factors/Patient Goals at Admission:     Personal Goals and Risk Factors at Admission - 07/06/17 1017      Core Components/Risk Factors/Patient Goals on Admission    Weight Management Yes;Obesity;Weight Loss;Weight Maintenance   Intervention Weight Management: Develop a combined nutrition and exercise program designed to reach desired caloric intake, while maintaining appropriate intake of nutrient and fiber, sodium and fats, and appropriate energy expenditure required for the weight goal.;Weight Management: Provide education and appropriate resources to help participant work on and attain dietary goals.;Weight Management/Obesity: Establish reasonable short term and long term weight goals.;Obesity: Provide education and appropriate resources to help participant work on and attain dietary goals.   Expected Outcomes Short Term: Continue to assess and modify interventions until short  term weight is achieved;Long Term: Adherence to nutrition  and physical activity/exercise program aimed toward attainment of established weight goal;Weight Maintenance: Understanding of the daily nutrition guidelines, which includes 25-35% calories from fat, 7% or less cal from saturated fats, less than 200mg  cholesterol, less than 1.5gm of sodium, & 5 or more servings of fruits and vegetables daily;Understanding recommendations for meals to include 15-35% energy as protein, 25-35% energy from fat, 35-60% energy from carbohydrates, less than 200mg  of dietary cholesterol, 20-35 gm of total fiber daily;Weight Loss: Understanding of general recommendations for a balanced deficit meal plan, which promotes 1-2 lb weight loss per week and includes a negative energy balance of [phone] kcal/d;Understanding of distribution of calorie intake throughout the day with the consumption of 4-5 meals/snacks   Hypertension Yes   Intervention Provide education on lifestyle modifcations including regular physical activity/exercise, weight management, moderate sodium restriction and increased consumption of fresh fruit, vegetables, and low fat dairy, alcohol moderation, and smoking cessation.;Monitor prescription use compliance.   Expected Outcomes Short Term: Continued assessment and intervention until BP is < 140/32mm HG in hypertensive participants. < 130/59mm HG in hypertensive participants with diabetes, heart failure or chronic kidney disease.;Long Term: Maintenance of blood pressure at goal levels.   Lipids Yes   Intervention Provide education and support for participant on nutrition & aerobic/resistive exercise along with prescribed medications to achieve LDL 70mg , HDL >40mg .   Expected Outcomes Short Term: Participant states understanding of desired cholesterol values and is compliant with medications prescribed. Participant is following exercise prescription and nutrition guidelines.;Long Term: Cholesterol controlled  with medications as prescribed, with individualized exercise RX and with personalized nutrition plan. Value goals: LDL < 70mg , HDL > 40 mg.   Stress Yes   Intervention Offer individual and/or small group education and counseling on adjustment to heart disease, stress management and health-related lifestyle change. Teach and support self-help strategies.;Refer participants experiencing significant psychosocial distress to appropriate mental health specialists for further evaluation and treatment. When possible, include family members and significant others in education/counseling sessions.   Expected Outcomes Short Term: Participant demonstrates changes in health-related behavior, relaxation and other stress management skills, ability to obtain effective social support, and compliance with psychotropic medications if prescribed.;Long Term: Emotional wellbeing is indicated by absence of clinically significant psychosocial distress or social isolation.      Core Components/Risk Factors/Patient Goals Review:    Core Components/Risk Factors/Patient Goals at Discharge (Final Review):    ITP Comments:     ITP Comments    Row Name 07/06/17 0815           ITP Comments Dr. Fransico Him, Medical Director          Comments:  Patient attended orientation from 0745 to 0945 to review rules and guidelines for program. Completed 6 minute walk test, Intitial ITP, and exercise prescription.  VSS. Telemetry-SB/SR with  No ectopy.  Asymptomatic. Pt tolerated well and is eager to learn more about his heart.  Brief psychosocial assessment reveal no immediate barriers to cardiac rehab.  Pt rated a "medium" level of stress related to work/finances.  Pt desires to meet with vocational rehab counselor. Cherre Huger, BSN Cardiac and Training and development officer

## 2017-07-06 NOTE — Progress Notes (Signed)
Dominic Ray 61 y.o. male DOB: 1956/02/29 MRN: [id number]      Nutrition Note  1. 05/09/17 S/P CABG x 4    Past Medical History:  Diagnosis Date  . Basal cell carcinoma of nasal tip 08/24/2012  . Coronary artery disease    a. Abnl stress test -> s/p CABGx4 in 04/2017 with LIMA-LAD, SVG-diag, SVG-AM, SVG-PDA.  Marland Kitchen Hyperlipidemia   . Hypertension   . Male hypogonadism 08/24/2012  . Onychomycosis 08/24/2012   Meds reviewed.  HT: Ht Readings from Last 1 Encounters:  07/06/17 5' 7.75" (1.721 m)    WT: Wt Readings from Last 3 Encounters:  07/06/17 206 lb 2.1 oz (93.5 kg)  06/21/17 202 lb (91.6 kg)  06/06/17 206 lb 12.8 oz (93.8 kg)     BMI 31.57   Current tobacco use? No  Labs:  Lipid Panel     Component Value Date/Time   CHOL 68 (L) 06/22/2017 0920   TRIG 55 06/22/2017 0920   HDL 31 (L) 06/22/2017 0920   CHOLHDL 2.2 06/22/2017 0920   CHOLHDL 5.5 12/06/2012 0857   VLDL 36 12/06/2012 0857   LDLCALC 26 06/22/2017 0920    Lab Results  Component Value Date   HGBA1C 5.4 05/08/2017   CBG (last 3)  No results for input(s): GLUCAP in the last 72 hours.  Nutrition Note Spoke with pt. Nutrition plan and goals reviewed with pt. Pt is following Step 2 of the TLC diet. Pt reports he has lost 12 lb and wants to lose ~ 12 lb more. Pt has been trying to lose wt by exercising and watching portion sizes. Pt expressed understanding of the information reviewed. Pt aware of nutrition education classes offered and plans on attending nutrition classes.  Nutrition Diagnosis ? Food-and nutrition-related knowledge deficit related to lack of exposure to information as related to diagnosis of: ? CVD  ? Obesity related to excessive energy intake as evidenced by a BMI of 31.6  Nutrition Intervention ? Pt's individual nutrition plan and goals reviewed with pt.  Nutrition Goal(s):  ? Pt to identify food quantities necessary to achieve weight loss of 6-12 lb at graduation from cardiac rehab. Goal wt  of 188 lb desired.   Plan:  Pt to attend nutrition classes ? Nutrition I ? Nutrition II ? Portion Distortion  Will provide client-centered nutrition education as part of interdisciplinary care.   Monitor and evaluate progress toward nutrition goal with team.  Derek Mound, M.Ed, RD, LDN, CDE 07/06/2017 12:17 PM

## 2017-07-10 ENCOUNTER — Encounter (HOSPITAL_COMMUNITY)
Admission: RE | Admit: 2017-07-10 | Discharge: 2017-07-10 | Disposition: A | Discharge status: Home / Self Care | Payer: BLUE CROSS/BLUE SHIELD | Source: Ambulatory Visit | Attending: Cardiology | Admitting: Cardiology

## 2017-07-10 ENCOUNTER — Encounter (HOSPITAL_COMMUNITY): Payer: BLUE CROSS/BLUE SHIELD

## 2017-07-10 DIAGNOSIS — Z951 Presence of aortocoronary bypass graft: Secondary | ICD-10-CM

## 2017-07-10 NOTE — Progress Notes (Signed)
Daily Session Note  Patient Details  Name: Dominic Ray MRN: [id number] Date of Birth: 05-16-1956 Referring Provider:     CARDIAC REHAB PHASE II ORIENTATION from 07/06/2017 in [facility]  Referring Provider  Kirk Ruths MD      Encounter Date: 07/10/2017  Check In:     Session Check In - 07/10/17 1056      Check-In   Location [facility]   Staff Present Maurice Small, RN, BSN;Amber Fair, MS, ACSM RCEP, Exercise Physiologist;Arline Ketter, RN, Deland Pretty, MS, ACSM CEP, Exercise Physiologist   Supervising physician immediately available to respond to emergencies [facility] immediately available   Physician(s) Dr. Clementeen Graham   Medication changes reported     No   Fall or balance concerns reported    No   Tobacco Cessation No Change   Warm-up and Cool-down Performed as group-led instruction   Resistance Training Performed Yes   VAD Patient? No     Pain Assessment   Currently in Pain? No/denies   Multiple Pain Sites No      Capillary Blood Glucose: No results found for this or any previous visit (from the past 24 hour(s)).    History  Smoking Status  . Never Smoker  Smokeless Tobacco  . Never Used    Goals Met:  Exercise tolerated well  Goals Unmet:  Not Applicable  Comments: [name] started cardiac rehab today.  Pt tolerated light exercise without difficulty. VSS, telemetry-Sinus Rhythm, asymptomatic.  Medication list reconciled. Pt denies barriers to medicaiton compliance.  PSYCHOSOCIAL ASSESSMENT:  PHQ-0. Pt exhibits positive coping skills, hopeful outlook with supportive family. No psychosocial needs identified at this time, no psychosocial interventions necessary.    Pt enjoyed, playing soft ball prior to having surgery.   Pt oriented to exercise equipment and routine.    Understanding verbalized. Patient given a vocational rehab packet to review and return as [name] is interested in [place] rehab  services.Barnet Pall, RN,BSN 07/10/2017 5:01 PM   Dr. Fransico Him is Medical Director for Cardiac Rehab at [facility].

## 2017-07-12 ENCOUNTER — Encounter (HOSPITAL_COMMUNITY)
Admission: RE | Admit: 2017-07-12 | Discharge: 2017-07-12 | Disposition: A | Discharge status: Home / Self Care | Payer: BLUE CROSS/BLUE SHIELD | Source: Ambulatory Visit | Attending: Cardiology | Admitting: Cardiology

## 2017-07-12 ENCOUNTER — Encounter (HOSPITAL_COMMUNITY): Payer: BLUE CROSS/BLUE SHIELD

## 2017-07-12 DIAGNOSIS — Z951 Presence of aortocoronary bypass graft: Secondary | ICD-10-CM | POA: Diagnosis not present

## 2017-07-14 ENCOUNTER — Encounter (HOSPITAL_COMMUNITY)
Admission: RE | Admit: 2017-07-14 | Discharge: 2017-07-14 | Disposition: A | Discharge status: Home / Self Care | Payer: BLUE CROSS/BLUE SHIELD | Source: Ambulatory Visit | Attending: Cardiology | Admitting: Cardiology

## 2017-07-14 ENCOUNTER — Encounter (HOSPITAL_COMMUNITY): Payer: BLUE CROSS/BLUE SHIELD

## 2017-07-14 DIAGNOSIS — Z951 Presence of aortocoronary bypass graft: Secondary | ICD-10-CM | POA: Diagnosis not present

## 2017-07-19 ENCOUNTER — Encounter (HOSPITAL_COMMUNITY)
Admission: RE | Admit: 2017-07-19 | Discharge: 2017-07-19 | Disposition: A | Discharge status: Home / Self Care | Payer: BLUE CROSS/BLUE SHIELD | Source: Ambulatory Visit | Attending: Cardiology | Admitting: Cardiology

## 2017-07-19 ENCOUNTER — Ambulatory Visit (INDEPENDENT_AMBULATORY_CARE_PROVIDER_SITE_OTHER): Payer: BLUE CROSS/BLUE SHIELD | Admitting: Physician Assistant

## 2017-07-19 ENCOUNTER — Encounter: Payer: Self-pay | Admitting: Physician Assistant

## 2017-07-19 ENCOUNTER — Encounter (HOSPITAL_COMMUNITY): Payer: BLUE CROSS/BLUE SHIELD

## 2017-07-19 VITALS — BP 120/71 | HR 72 | Ht 67.0 in | Wt 204.4 lb

## 2017-07-19 DIAGNOSIS — Z951 Presence of aortocoronary bypass graft: Secondary | ICD-10-CM | POA: Diagnosis not present

## 2017-07-19 DIAGNOSIS — I25810 Atherosclerosis of coronary artery bypass graft(s) without angina pectoris: Secondary | ICD-10-CM | POA: Diagnosis not present

## 2017-07-19 DIAGNOSIS — E785 Hyperlipidemia, unspecified: Secondary | ICD-10-CM | POA: Diagnosis not present

## 2017-07-19 DIAGNOSIS — I10 Essential (primary) hypertension: Secondary | ICD-10-CM | POA: Diagnosis not present

## 2017-07-19 NOTE — Progress Notes (Signed)
Cardiology Office Note    Date:  07/21/2017   ID:  Dominic Ray, DOB [birth year], MRN [id number]  PCP:  Silverio Decamp, MD  Cardiologist:  [name]   Chief Complaint  Patient presents with  . Follow-up    seen for [name]. s/p CABG    History of Present Illness:  Dominic Ray is a 61 y.o. male with PMH of HTN, HLD, recently diagnosed CAD s/p CABG. He was seen in May 2018 with episode of chest pain. Echocardiogram obtained on 04/12/2017 showed EF 62-70%, normal diastolic function, mild LAE. ETT obtained on 04/25/2017 was abnormal with 2 mm ST depression in inferior leads and lead 1 beginning at peak exercise. He eventually underwent cath on 05/08/2017 demonstrating significant multivessel CAD. He underwent CABG with LIMA to LAD, SVG to diagonal, SVG to AM and SVG to PDA on 05/09/2017.    He has started on cardiac rehabilitation and has been exercising without any significant chest discomfort or shortness of breath. He does continue to have some soreness over the left chest especially when he raises his arm. He also has a little bit tingling in the right lower extremity. Vein harvesting site in the right lower extremity and a sternal wound appears to be well-healed. He is currently on 325 mg aspirin, will defer to CT surgery to reduce the dose to 81 mg daily. He is also on high-dose Lipitor, recent cholesterol panel shows well-controlled LDL, total cholesterol and triglycerides. HDL remains low. He will begin to increase activity level from this point forward. He want to go back to playing softball, however I have advised him to delay over strenuous activity until at least the end of this month, at which time he will be 3 months out from the CABG.   Past Medical History:  Diagnosis Date  . Basal cell carcinoma of nasal tip 08/24/2012  . Coronary artery disease    a. Abnl stress test -> s/p CABGx4 in 04/2017 with LIMA-LAD, SVG-diag, SVG-AM, SVG-PDA.  Marland Kitchen Hyperlipidemia   . Hypertension     . Male hypogonadism 08/24/2012  . Onychomycosis 08/24/2012    Past Surgical History:  Procedure Laterality Date  . CARDIAC CATHETERIZATION  05/08/2017  . CARDIOVASCULAR STRESS TEST  04/2017  . CORONARY ARTERY BYPASS GRAFT N/A 05/09/2017   Procedure: CORONARY ARTERY BYPASS GRAFTING times four using left internal mammary artery and right leg saphenous vein  ;  Surgeon: Gaye Pollack, MD;  Location: [facility];  Service: Open Heart Surgery;  Laterality: N/A;  . LEFT HEART CATH AND CORONARY ANGIOGRAPHY N/A 05/08/2017   Procedure: Left Heart Cath and Coronary Angiography;  Surgeon: Troy Sine, MD;  Location: [facility];  Service: Cardiovascular;  Laterality: N/A;  . TEE WITHOUT CARDIOVERSION N/A 05/09/2017   Procedure: TRANSESOPHAGEAL ECHOCARDIOGRAM (TEE);  Surgeon: Gaye Pollack, MD;  Location: [facility];  Service: Open Heart Surgery;  Laterality: N/A;  . TONSILLECTOMY  1968    Current Medications: Outpatient Medications Prior to Visit  Medication Sig Dispense Refill  . aspirin EC 325 MG EC tablet Take 1 tablet (325 mg total) by mouth daily. 30 tablet 0  . atorvastatin (LIPITOR) 80 MG tablet Take 1 tablet (80 mg total) by mouth daily at 6 PM. 30 tablet 12  . metoprolol tartrate (LOPRESSOR) 25 MG tablet Take 1 tablet (25 mg total) by mouth 2 (two) times daily. 60 tablet 12   No facility-administered medications prior to visit.      Allergies:  Patient has no active allergies.   Social History   Social History  . Marital status: Married    Spouse name: N/A  . Number of children: N/A  . Years of education: N/A   Occupational History  .      Unemployed   Social History Main Topics  . Smoking status: Never Smoker  . Smokeless tobacco: Never Used  . Alcohol use Yes     Comment: Occasional  . Drug use: No  . Sexual activity: Not Asked   Other Topics Concern  . None   Social History Narrative  . None     Family History:  The patient's family history includes CAD in  his father; Cancer in his mother.   ROS:   Please see the history of present illness.    ROS All other systems reviewed and are negative.   PHYSICAL EXAM:   VS:  BP 120/71   Pulse 72   Ht 5\' 7"  (1.702 m)   Wt 204 lb 6.4 oz (92.7 kg)   SpO2 96%   BMI 32.01 kg/m    GEN: Well nourished, well developed, in no acute distress  HEENT: normal  Neck: no JVD, carotid bruits, or masses Cardiac: RRR; no murmurs, rubs, or gallops,no edema  Respiratory:  clear to auscultation bilaterally, normal work of breathing GI: soft, nontender, nondistended, + BS MS: no deformity or atrophy  Skin: warm and dry, no rash Neuro:  Alert and Oriented x 3, Strength and sensation are intact Psych: euthymic mood, full affect  Wt Readings from Last 3 Encounters:  07/19/17 204 lb 6.4 oz (92.7 kg)  07/06/17 206 lb 2.1 oz (93.5 kg)  06/21/17 202 lb (91.6 kg)      Studies/Labs Reviewed:   EKG:  EKG is not ordered today.   Recent Labs: 05/10/2017: Magnesium 2.0 05/11/2017: Hemoglobin 11.2; Platelets 176 06/06/2017: BUN 14; Creatinine, Ser 0.96; Potassium 5.1; Sodium 141 06/22/2017: ALT 13   Lipid Panel    Component Value Date/Time   CHOL 68 (L) 06/22/2017 0920   TRIG 55 06/22/2017 0920   HDL 31 (L) 06/22/2017 0920   CHOLHDL 2.2 06/22/2017 0920   CHOLHDL 5.5 12/06/2012 0857   VLDL 36 12/06/2012 0857   LDLCALC 26 06/22/2017 0920    Additional studies/ records that were reviewed today include:   Echo 04/12/2017 LV EF: 60% -   65%  Study Conclusions  - Left ventricle: The cavity size was mildly dilated. Wall   thickness was increased in a pattern of mild LVH. Systolic   function was normal. The estimated ejection fraction was in the   range of 60% to 65%. Wall motion was normal; there were no   regional wall motion abnormalities. Left ventricular diastolic   function parameters were normal. - Left atrium: The atrium was mildly dilated. - Atrial septum: No defect or patent foramen ovale was  identified.   ETT 04/25/2017 Study Highlights     Blood pressure demonstrated a normal response to exercise.  Downsloping ST segment depression ST segment depression of 2 mm was noted during stress in the II, III, aVF and I leads.   Positive ETT 2 mm ST depression in inferior leads and lead 1 beginning at peak exercise Continued in recovery with T wave inversion in lead one      Cath 05/08/2017 Conclusion     Ost LM-1 lesion, 30 %stenosed.  Ost LM-2 lesion, 30 %stenosed.  1st Diag lesion, 70 %stenosed.  Mid LAD lesion, 90 %  stenosed.  Prox RCA lesion, 90 %stenosed.  Mid Cx lesion, 100 %stenosed.  Mid RCA lesion, 100 %stenosed.  The left ventricular systolic function is normal.  The left ventricular ejection fraction is 50-55% by visual estimate.   Normal LV function with mild focal basal inferior hypocontractility.  Significant multivessel CAD with 30% stenoses in the mid distal left main coronary artery; 70% percent stenosis in the first diagonal branch of the LAD with 90% focal stenosis in the LAD after the septal perforator and first diagonal branch; normal small ramus intermediate vessel; total occlusion of the mid AV groove circumflex with excellent collateralization of the distal marginal and distal circumflex from the apical LAD; 90% stenosis of the RCA in its midsegment before large RV marginal branch with total occlusion of the mid RCA immediately after the marginal branch.  There is retrograde collateralization to this large RV marginal branch from very proximal vessel and this large RV marginal branch also distally collateralizes the distal RCA.  RECOMMENDATION: Surgical consultation will be obtained for CABG revascularization surgery.       ASSESSMENT:    1. Coronary artery disease involving coronary bypass graft of native heart without angina pectoris   2. Essential hypertension   3. Hyperlipidemia, unspecified hyperlipidemia type      PLAN:    In order of problems listed above:  1. CAD s/p CABG: His sternal wound and vein harvesting sites are well-healed. Denies any exertional chest discomfort with shortness of breath. He is actually quite active at this point. Still on the high dose aspirin, will defer to CT surgery on when to transition to 81 mg aspirin. Likely okay to transition to 81 mg aspirin by the end of this month at which point he will be 3 months out from the surgery.  2. Hypertension: Blood pressure well controlled on metoprolol 25 mg twice a day.  3. Hyperlipidemia: On Lipitor 80 mg daily, LFT and fasting lipid panel obtained on 06/22/2017 shows well-controlled cholesterol, LDL and triglyceride, his HDL is low at 31. He will need to increase activity level.    Medication Adjustments/Labs and Tests Ordered: Current medicines are reviewed at length with the patient today.  Concerns regarding medicines are outlined above.  Medication changes, Labs and Tests ordered today are listed in the Patient Instructions below. Patient Instructions  Medication Instructions:   No changes to current regimen.  Labwork:   None ordered  Testing/Procedures:  None ordered  Follow-Up:  3 months with [name]   If you need a refill on your cardiac medications before your next appointment, please call your pharmacy.      Hilbert Corrigan, [place]  07/21/2017 9:58 AM    [address], [employer], [place]  [address] Phone: [phone]; Fax: [phone]

## 2017-07-19 NOTE — Patient Instructions (Signed)
Medication Instructions:   No changes to current regimen.  Labwork:   None ordered  Testing/Procedures:  None ordered  Follow-Up:  3 months with [name]   If you need a refill on your cardiac medications before your next appointment, please call your pharmacy.

## 2017-07-21 ENCOUNTER — Encounter (HOSPITAL_COMMUNITY): Payer: BLUE CROSS/BLUE SHIELD

## 2017-07-21 ENCOUNTER — Encounter (HOSPITAL_COMMUNITY)
Admission: RE | Admit: 2017-07-21 | Discharge: 2017-07-21 | Disposition: A | Discharge status: Home / Self Care | Payer: BLUE CROSS/BLUE SHIELD | Source: Ambulatory Visit | Attending: Cardiology | Admitting: Cardiology

## 2017-07-21 ENCOUNTER — Encounter: Payer: Self-pay | Admitting: Physician Assistant

## 2017-07-21 DIAGNOSIS — Z951 Presence of aortocoronary bypass graft: Secondary | ICD-10-CM

## 2017-07-21 NOTE — Progress Notes (Signed)
Reviewed home exercise guidelines with patient including endpoints, temperature precautions, target heart rate and rate of perceived exertion. Pt is walking 24 minutes twice daily, 2 days/week and has a membership at a gym that he plans to utilize as his. mode of home exercise. Pt is eager to return to weight training and more strenuous exercise. Pt advised to adhere to weight lifting restrictions set aside by his physician and exercise in moderation for safety. Eventually, pt would like to resume water aerobics/swiming as part of his exercise routine. Pt voices understanding of instructions given. Sol Passer, MS, ACSM CEP

## 2017-07-24 ENCOUNTER — Encounter (HOSPITAL_COMMUNITY): Payer: BLUE CROSS/BLUE SHIELD

## 2017-07-24 ENCOUNTER — Encounter (HOSPITAL_COMMUNITY)
Admission: RE | Admit: 2017-07-24 | Discharge: 2017-07-24 | Disposition: A | Discharge status: Home / Self Care | Payer: BLUE CROSS/BLUE SHIELD | Source: Ambulatory Visit | Attending: Cardiology | Admitting: Cardiology

## 2017-07-24 DIAGNOSIS — Z951 Presence of aortocoronary bypass graft: Secondary | ICD-10-CM | POA: Diagnosis not present

## 2017-07-26 ENCOUNTER — Encounter (HOSPITAL_COMMUNITY): Payer: BLUE CROSS/BLUE SHIELD

## 2017-07-26 ENCOUNTER — Encounter (HOSPITAL_COMMUNITY)
Admission: RE | Admit: 2017-07-26 | Discharge: 2017-07-26 | Disposition: A | Discharge status: Home / Self Care | Payer: BLUE CROSS/BLUE SHIELD | Source: Ambulatory Visit | Attending: Cardiology | Admitting: Cardiology

## 2017-07-26 DIAGNOSIS — Z951 Presence of aortocoronary bypass graft: Secondary | ICD-10-CM

## 2017-07-26 NOTE — Progress Notes (Signed)
Dominic Ray 61 y.o. male DOB: November 14, 1956 MRN: [id number]      Nutrition Note  1. 05/09/17 S/P CABG x 4    Nutrition Note Spoke with pt. Nutrition plan and survey reviewed with pt. Pt is following Step 2 of the [facility] diet. Pt wants to lose wt. Pt has lost 2.3 lb since admission. Pt concerned about "hitting a wt loss plateau." Pt has been trying to lose wt by exercising and watching portion sizes. Wt loss tips discussed. Pt expressed understanding of the information reviewed. Pt aware of nutrition education classes offered.  Nutrition Diagnosis ? Food-and nutrition-related knowledge deficit related to lack of exposure to information as related to diagnosis of: ? CVD  ? Obesity related to excessive energy intake as evidenced by a BMI of 31.6  Nutrition Intervention ? Pt's individual nutrition plan and goals reviewed with pt. ? Benefits of adopting Therapeutic Lifestyle Changes discussed when Medficts reviewed.   ? Pt given handouts for: ? 5 day menu ideas  Nutrition Goal(s):  ? Pt to identify food quantities necessary to achieve weight loss of 6-12 lb at graduation from cardiac rehab. Goal wt of 188 lb desired.   Plan:  Pt to attend nutrition classes ? Nutrition I- met 07/18/17 ? Nutrition II - met 07/25/17 ? Portion Distortion  Will provide client-centered nutrition education as part of interdisciplinary care.   Monitor and evaluate progress toward nutrition goal with team.  Derek Mound, M.Ed, RD, LDN, CDE 07/26/2017 10:47 AM

## 2017-07-27 NOTE — Progress Notes (Signed)
Cardiac Individual Treatment Plan  Patient Details  Name: Dominic Ray MRN: [id number] Date of Birth: 24-Feb-1956 Referring Provider:     CARDIAC REHAB PHASE II ORIENTATION from 07/06/2017 in [place]  Referring Provider  Kirk Ruths MD      Initial Encounter Date:    CARDIAC REHAB PHASE II ORIENTATION from 07/06/2017 in [place]  Date  07/06/17  Referring Provider  Kirk Ruths MD      Visit Diagnosis: 05/09/17 S/P CABG x 4  Patient's Home Medications on Admission:  Current Outpatient Prescriptions:  .  aspirin EC 325 MG EC tablet, Take 1 tablet (325 mg total) by mouth daily., Disp: 30 tablet, Rfl: 0 .  atorvastatin (LIPITOR) 80 MG tablet, Take 1 tablet (80 mg total) by mouth daily at 6 PM., Disp: 30 tablet, Rfl: 12 .  metoprolol tartrate (LOPRESSOR) 25 MG tablet, Take 1 tablet (25 mg total) by mouth 2 (two) times daily., Disp: 60 tablet, Rfl: 12  Past Medical History: Past Medical History:  Diagnosis Date  . Basal cell carcinoma of nasal tip 08/24/2012  . Coronary artery disease    a. Abnl stress test -> s/p CABGx4 in 04/2017 with LIMA-LAD, SVG-diag, SVG-AM, SVG-PDA.  Marland Kitchen Hyperlipidemia   . Hypertension   . Male hypogonadism 08/24/2012  . Onychomycosis 08/24/2012    Tobacco Use: History  Smoking Status  . Never Smoker  Smokeless Tobacco  . Never Used    Labs: Recent Review Flowsheet Data    Labs for ITP Cardiac and Pulmonary Rehab Latest Ref Rng & Units 05/09/2017 05/09/2017 05/09/2017 05/10/2017 06/22/2017   Cholestrol 100 - 199 mg/dL - - - - 68(L)   LDLCALC 0 - 99 mg/dL - - - - 26   HDL >39 mg/dL - - - - 31(L)   Trlycerides 0 - 149 mg/dL - - - - 55   Hemoglobin A1c 4.8 - 5.6 % - - - - -   PHART 7.350 - 7.450 7.332(L) 7.356 - - -   PCO2ART 32.0 - 48.0 mmHg 47.5 43.1 - - -   HCO3 20.0 - 28.0 mmol/L 25.0 24.0 - - -   TCO2 0 - 100 mmol/L 26 25 25 28  -   ACIDBASEDEF 0.0 - 2.0 mmol/L 1.0 1.0 - - -    O2SAT % 98.0 93.0 - - -      Capillary Blood Glucose: Lab Results  Component Value Date   GLUCAP 97 05/11/2017   GLUCAP 112 (H) 05/11/2017   GLUCAP 110 (H) 05/11/2017   GLUCAP 131 (H) 05/10/2017   GLUCAP 157 (H) 05/10/2017     Exercise Target Goals:    Exercise Program Goal: Individual exercise prescription set with THRR, safety & activity barriers. Participant demonstrates ability to understand and report RPE using BORG scale, to self-measure pulse accurately, and to acknowledge the importance of the exercise prescription.  Exercise Prescription Goal: Starting with aerobic activity 30 plus minutes a day, 3 days per week for initial exercise prescription. Provide home exercise prescription and guidelines that participant acknowledges understanding prior to discharge.  Activity Barriers & Risk Stratification:     Activity Barriers & Cardiac Risk Stratification - 07/06/17 0838      Activity Barriers & Cardiac Risk Stratification   Activity Barriers Arthritis;Back Problems   Cardiac Risk Stratification High      6 Minute Walk:     6 Minute Walk    Row Name 07/06/17 1000  6 Minute Walk   Phase Initial     Distance 18888 feet     Walk Time 6 minutes     # of Rest Breaks 0     MPH 3.68     METS 4.19     RPE 9     VO2 Peak 14.68     Symptoms No     Resting HR 57 bpm     Resting BP 138/70     Max Ex. HR 93 bpm     Max Ex. BP 134/82     2 Minute Post BP 124/70        Oxygen Initial Assessment:   Oxygen Re-Evaluation:   Oxygen Discharge (Final Oxygen Re-Evaluation):   Initial Exercise Prescription:     Initial Exercise Prescription - 07/06/17 1000      Date of Initial Exercise RX and Referring Provider   Date 07/06/17   Referring Provider Kirk Ruths MD     Treadmill   MPH 3.1   Grade 1   Minutes 10   METs 3.8     Bike   Level 1   Minutes 10   METs 2.99     NuStep   Level 3   SPM 80   Minutes 10   METs 2.5     Arm  Ergometer   Level 2   Minutes 10   METs 2.5     Prescription Details   Frequency (times per week) 3   Duration Progress to 30 minutes of continuous aerobic without signs/symptoms of physical distress     Intensity   THRR 40-80% of Max Heartrate 64-128   Ratings of Perceived Exertion 11-13   Perceived Dyspnea 0-4     Progression   Progression Continue progressive overload as per policy without signs/symptoms or physical distress.     Resistance Training   Training Prescription Yes   Weight 4lbs   Reps 10-15      Perform Capillary Blood Glucose checks as needed.  Exercise Prescription Changes:     Exercise Prescription Changes    Row Name 07/10/17 1700 07/25/17 1000           Response to Exercise   Blood Pressure (Admit) 122/62 104/72      Blood Pressure (Exercise) 170/84 126/80      Blood Pressure (Exit) 108/60 110/60      Heart Rate (Admit) 58 bpm 55 bpm      Heart Rate (Exercise) 92 bpm 106 bpm      Heart Rate (Exit) 58 bpm 56 bpm      Rating of Perceived Exertion (Exercise) 12 11      Symptoms  - none      Duration Progress to 30 minutes of  aerobic without signs/symptoms of physical distress Progress to 45 minutes of aerobic exercise without signs/symptoms of physical distress      Intensity THRR unchanged THRR unchanged        Progression   Progression Continue to progress workloads to maintain intensity without signs/symptoms of physical distress. Continue to progress workloads to maintain intensity without signs/symptoms of physical distress.      Average METs 3.1 4.7        Resistance Training   Training Prescription Yes Yes      Weight 2lbs 4lbs      Reps 10-15 10-15        Interval Training   Interval Training No No        Treadmill  MPH 3.1 3.6      Grade 1 3      Minutes 10 10      METs 3.8 4.5        Bike   Level 1 2      Minutes 10 10      METs 3.02 5.1        NuStep   Level - 5      SPM - 90      Minutes - 10      METs - 4.5         Arm Ergometer   Level 2  -      Minutes 10  -      METs 2.4  -        Home Exercise Plan   Plans to continue exercise at  - Home (comment)      Frequency  - Add 2 additional days to program exercise sessions.      Initial Home Exercises Provided  - 07/21/17         Exercise Comments:     Exercise Comments    Row Name 07/21/17 1110 07/26/17 1110         Exercise Comments Reviewed home exercise guidelines. Pt is walking and has access to a gym where he plans to use the pool and exercise equipment for his home exercise. Reviewed METs and goals with patient.         Exercise Goals and Review:     Exercise Goals    Row Name 07/06/17 0839 07/06/17 1019           Exercise Goals   Increase Physical Activity Yes  -      Intervention Provide advice, education, support and counseling about physical activity/exercise needs.;Develop an individualized exercise prescription for aerobic and resistive training based on initial evaluation findings, risk stratification, comorbidities and participant's personal goals.  -      Expected Outcomes Achievement of increased cardiorespiratory fitness and enhanced flexibility, muscular endurance and strength shown through measurements of functional capacity and personal statement of participant.  -      Increase Strength and Stamina Yes  increase muscle tone and get stronger Yes  return to jogging, increase muscle tone      Intervention Provide advice, education, support and counseling about physical activity/exercise needs.;Develop an individualized exercise prescription for aerobic and resistive training based on initial evaluation findings, risk stratification, comorbidities and participant's personal goals.  -      Expected Outcomes Achievement of increased cardiorespiratory fitness and enhanced flexibility, muscular endurance and strength shown through measurements of functional capacity and personal statement of participant.  -          Exercise Goals Re-Evaluation :     Exercise Goals Re-Evaluation    Row Name 07/26/17 1110             Exercise Goal Re-Evaluation   Exercise Goals Review Increase Physical Activity;Increase Strength and Stamina;Knowledge and understanding of Target Heart Rate Range (THRR);Able to understand and use rate of perceived exertion (RPE) scale       Comments Pt states he's "slowly but surely: getting stronger. Pt has returned to some exercise at a gym in addition to CR.       Expected Outcomes Increase workloads as tolerated. Return to exercise routine at fitness center.           Discharge Exercise Prescription (Final Exercise Prescription Changes):     Exercise Prescription Changes -  07/25/17 1000      Response to Exercise   Blood Pressure (Admit) 104/72   Blood Pressure (Exercise) 126/80   Blood Pressure (Exit) 110/60   Heart Rate (Admit) 55 bpm   Heart Rate (Exercise) 106 bpm   Heart Rate (Exit) 56 bpm   Rating of Perceived Exertion (Exercise) 11   Symptoms none   Duration Progress to 45 minutes of aerobic exercise without signs/symptoms of physical distress   Intensity THRR unchanged     Progression   Progression Continue to progress workloads to maintain intensity without signs/symptoms of physical distress.   Average METs 4.7     Resistance Training   Training Prescription Yes   Weight 4lbs   Reps 10-15     Interval Training   Interval Training No     Treadmill   MPH 3.6   Grade 3   Minutes 10   METs 4.5     Bike   Level 2   Minutes 10   METs 5.1     NuStep   Level 5   SPM 90   Minutes 10   METs 4.5     Home Exercise Plan   Plans to continue exercise at Home (comment)   Frequency Add 2 additional days to program exercise sessions.   Initial Home Exercises Provided 07/21/17      Nutrition:  Target Goals: Understanding of nutrition guidelines, daily intake of sodium 1500mg , cholesterol 200mg , calories 30% from fat and 7% or less from  saturated fats, daily to have 5 or more servings of fruits and vegetables.  Biometrics:     Pre Biometrics - 07/06/17 1017      Pre Biometrics   Waist Circumference 39.5 inches   Hip Circumference 41 inches   Waist to Hip Ratio 0.96 %   Triceps Skinfold 19 mm   % Body Fat 29.5 %   Grip Strength 33 kg   Flexibility 14 in   Single Leg Stand 30 seconds       Nutrition Therapy Plan and Nutrition Goals:     Nutrition Therapy & Goals - 07/06/17 1224      Nutrition Therapy   Diet Therapeutic Lifestyle Changes     Personal Nutrition Goals   Nutrition Goal Pt to identify food quantities necessary to achieve weight loss of 6-12 lb at graduation from cardiac rehab. Goal wt of 188 lb desired.      Intervention Plan   Intervention Prescribe, educate and counsel regarding individualized specific dietary modifications aiming towards targeted core components such as weight, hypertension, lipid management, diabetes, heart failure and other comorbidities.   Expected Outcomes Short Term Goal: Understand basic principles of dietary content, such as calories, fat, sodium, cholesterol and nutrients.;Long Term Goal: Adherence to prescribed nutrition plan.      Nutrition Discharge: Nutrition Scores:     Nutrition Assessments - 07/06/17 1224      MEDFICTS Scores   Pre Score 6      Nutrition Goals Re-Evaluation:   Nutrition Goals Re-Evaluation:   Nutrition Goals Discharge (Final Nutrition Goals Re-Evaluation):   Psychosocial: Target Goals: Acknowledge presence or absence of significant depression and/or stress, maximize coping skills, provide positive support system. Participant is able to verbalize types and ability to use techniques and skills needed for reducing stress and depression.  Initial Review & Psychosocial Screening:     Initial Psych Review & Screening - 07/06/17 1244      Initial Review   Current issues with Current Stress  Concerns   Source of Stress Concerns  Occupation;Financial   Comments Pt is not working-unemployed per vocational rehab intake form     [place]? Yes     Barriers   Psychosocial barriers to participate in program The patient should benefit from training in stress management and relaxation.     Screening Interventions   Interventions Encouraged to exercise      Quality of Life Scores:     Quality of Life - 07/06/17 1018      Quality of Life Scores   Health/Function Pre 23.3 %   Socioeconomic Pre 24.86 %   Psych/Spiritual Pre 23.29 %   Family Pre 25.5 %   GLOBAL Pre 23.89 %      PHQ-9: Recent Review Flowsheet Data    Depression screen [facility] 2/9 07/10/2017   Decreased Interest 0   Down, Depressed, Hopeless 0   PHQ - 2 Score 0     Interpretation of Total Score  Total Score Depression Severity:  1-4 = Minimal depression, 5-9 = Mild depression, 10-14 = Moderate depression, 15-19 = Moderately severe depression, 20-27 = Severe depression   Psychosocial Evaluation and Intervention:   Psychosocial Re-Evaluation:     Psychosocial Re-Evaluation    [place] Name 07/27/17 1011             Psychosocial Re-Evaluation   Current issues with None Identified       Interventions Encouraged to attend Cardiac Rehabilitation for the exercise       Continue Psychosocial Services  No Follow up required          Psychosocial Discharge (Final Psychosocial Re-Evaluation):     Psychosocial Re-Evaluation - 07/27/17 1011      Psychosocial Re-Evaluation   Current issues with None Identified   Interventions Encouraged to attend Cardiac Rehabilitation for the exercise   Continue Psychosocial Services  No Follow up required      Vocational Rehabilitation: Provide vocational rehab assistance to qualifying candidates.   Vocational Rehab Evaluation & Intervention:     Vocational Rehab - 07/13/17 1507      Initial Vocational Rehab Evaluation & Intervention   Assessment shows need for  Vocational Rehabilitation No     Vocational Rehab Re-Evaulation   Comments --  Mr Trueba says he does not need vocational rehab at this time and changed his mind after reviewing the packet.      Education: Education Goals: Education classes will be provided on a weekly basis, covering required topics. Participant will state understanding/return demonstration of topics presented.  Learning Barriers/Preferences:     Learning Barriers/Preferences - 07/06/17 9381      Learning Barriers/Preferences   Learning Barriers Sight   Learning Preferences Written Material;Skilled Demonstration      Education Topics: Count Your Pulse:  -Group instruction provided by verbal instruction, demonstration, patient participation and written materials to support subject.  Instructors address importance of being able to find your pulse and how to count your pulse when at home without a heart monitor.  Patients get hands on experience counting their pulse with staff help and individually.   CARDIAC REHAB PHASE II EXERCISE from 07/24/2017 in [place]  Date  07/21/17  Instruction Review Code  2- meets goals/outcomes      Heart Attack, Angina, and Risk Factor Modification:  -Group instruction provided by verbal instruction, video, and written materials to support subject.  Instructors address signs and symptoms of angina and heart attacks.  Also discuss risk factors for heart disease and how to make changes to improve heart health risk factors.   Functional Fitness:  -Group instruction provided by verbal instruction, demonstration, patient participation, and written materials to support subject.  Instructors address safety measures for doing things around the house.  Discuss how to get up and down off the floor, how to pick things up properly, how to safely get out of a chair without assistance, and balance training.   Meditation and Mindfulness:  -Group instruction  provided by verbal instruction, patient participation, and written materials to support subject.  Instructor addresses importance of mindfulness and meditation practice to help reduce stress and improve awareness.  Instructor also leads participants through a meditation exercise.    Stretching for Flexibility and Mobility:  -Group instruction provided by verbal instruction, patient participation, and written materials to support subject.  Instructors lead participants through series of stretches that are designed to increase flexibility thus improving mobility.  These stretches are additional exercise for major muscle groups that are typically performed during regular warm up and cool down.   Hands Only CPR:  -Group verbal, video, and participation provides a basic overview of AHA guidelines for community CPR. Role-play of emergencies allow participants the opportunity to practice calling for help and chest compression technique with discussion of AED use.   Hypertension: -Group verbal and written instruction that provides a basic overview of hypertension including the most recent diagnostic guidelines, risk factor reduction with self-care instructions and medication management.    Nutrition I class: Heart Healthy Eating:  -Group instruction provided by PowerPoint slides, verbal discussion, and written materials to support subject matter. The instructor gives an explanation and review of the Therapeutic Lifestyle Changes diet recommendations, which includes a discussion on lipid goals, dietary fat, sodium, fiber, plant stanol/sterol esters, sugar, and the components of a well-balanced, healthy diet.   CARDIAC REHAB PHASE II EXERCISE from 07/24/2017 in [place]  Date  07/18/17  Educator  RD  Instruction Review Code  2- meets goals/outcomes      Nutrition II class: Lifestyle Skills:  -Group instruction provided by PowerPoint slides, verbal discussion, and  written materials to support subject matter. The instructor gives an explanation and review of label reading, grocery shopping for heart health, heart healthy recipe modifications, and ways to make healthier choices when eating out.   CARDIAC REHAB PHASE II EXERCISE from 07/24/2017 in [place]  Date  07/25/17  Educator  RD  Instruction Review Code  2- meets goals/outcomes      Diabetes Question & Answer:  -Group instruction provided by PowerPoint slides, verbal discussion, and written materials to support subject matter. The instructor gives an explanation and review of diabetes co-morbidities, pre- and post-prandial blood glucose goals, pre-exercise blood glucose goals, signs, symptoms, and treatment of hypoglycemia and hyperglycemia, and foot care basics.   Diabetes Blitz:  -Group instruction provided by PowerPoint slides, verbal discussion, and written materials to support subject matter. The instructor gives an explanation and review of the physiology behind type 1 and type 2 diabetes, diabetes medications and rational behind using different medications, pre- and post-prandial blood glucose recommendations and Hemoglobin A1c goals, diabetes diet, and exercise including blood glucose guidelines for exercising safely.    Portion Distortion:  -Group instruction provided by PowerPoint slides, verbal discussion, written materials, and food models to support subject matter. The instructor gives an explanation of serving size versus portion size, changes in portions sizes  over the last 20 years, and what consists of a serving from each food group.   Stress Management:  -Group instruction provided by verbal instruction, video, and written materials to support subject matter.  Instructors review role of stress in heart disease and how to cope with stress positively.     Exercising on Your Own:  -Group instruction provided by verbal instruction, power point, and  written materials to support subject.  Instructors discuss benefits of exercise, components of exercise, frequency and intensity of exercise, and end points for exercise.  Also discuss use of nitroglycerin and activating EMS.  Review options of places to exercise outside of rehab.  Review guidelines for sex with heart disease.   CARDIAC REHAB PHASE II EXERCISE from 07/24/2017 in [place]  Date  07/19/17  Instruction Review Code  2- meets goals/outcomes      Cardiac Drugs I:  -Group instruction provided by verbal instruction and written materials to support subject.  Instructor reviews cardiac drug classes: antiplatelets, anticoagulants, beta blockers, and statins.  Instructor discusses reasons, side effects, and lifestyle considerations for each drug class.   Cardiac Drugs II:  -Group instruction provided by verbal instruction and written materials to support subject.  Instructor reviews cardiac drug classes: angiotensin converting enzyme inhibitors (ACE-I), angiotensin II receptor blockers (ARBs), nitrates, and calcium channel blockers.  Instructor discusses reasons, side effects, and lifestyle considerations for each drug class.   Anatomy and Physiology of the Circulatory System:  Group verbal and written instruction and models provide basic cardiac anatomy and physiology, with the coronary electrical and arterial systems. Review of: AMI, Angina, Valve disease, Heart Failure, Peripheral Artery Disease, Cardiac Arrhythmia, Pacemakers, and the ICD.   Other Education:  -Group or individual verbal, written, or video instructions that support the educational goals of the cardiac rehab program.   Knowledge Questionnaire Score:     Knowledge Questionnaire Score - 07/06/17 1000      Knowledge Questionnaire Score   Pre Score 25/28      Core Components/Risk Factors/Patient Goals at Admission:     Personal Goals and Risk Factors at Admission - 07/06/17 1017       Core Components/Risk Factors/Patient Goals on Admission    Weight Management Yes;Obesity;Weight Loss;Weight Maintenance   Intervention Weight Management: Develop a combined nutrition and exercise program designed to reach desired caloric intake, while maintaining appropriate intake of nutrient and fiber, sodium and fats, and appropriate energy expenditure required for the weight goal.;Weight Management: Provide education and appropriate resources to help participant work on and attain dietary goals.;Weight Management/Obesity: Establish reasonable short term and long term weight goals.;Obesity: Provide education and appropriate resources to help participant work on and attain dietary goals.   Expected Outcomes Short Term: Continue to assess and modify interventions until short term weight is achieved;Long Term: Adherence to nutrition and physical activity/exercise program aimed toward attainment of established weight goal;Weight Maintenance: Understanding of the daily nutrition guidelines, which includes 25-35% calories from fat, 7% or less cal from saturated fats, less than 200mg  cholesterol, less than 1.5gm of sodium, & 5 or more servings of fruits and vegetables daily;Understanding recommendations for meals to include 15-35% energy as protein, 25-35% energy from fat, 35-60% energy from carbohydrates, less than 200mg  of dietary cholesterol, 20-35 gm of total fiber daily;Weight Loss: Understanding of general recommendations for a balanced deficit meal plan, which promotes 1-2 lb weight loss per week and includes a negative energy balance of [phone] kcal/d;Understanding of distribution of calorie intake  throughout the day with the consumption of 4-5 meals/snacks   Hypertension Yes   Intervention Provide education on lifestyle modifcations including regular physical activity/exercise, weight management, moderate sodium restriction and increased consumption of fresh fruit, vegetables, and low fat dairy,  alcohol moderation, and smoking cessation.;Monitor prescription use compliance.   Expected Outcomes Short Term: Continued assessment and intervention until BP is < 140/79mm HG in hypertensive participants. < 130/79mm HG in hypertensive participants with diabetes, heart failure or chronic kidney disease.;Long Term: Maintenance of blood pressure at goal levels.   Lipids Yes   Intervention Provide education and support for participant on nutrition & aerobic/resistive exercise along with prescribed medications to achieve LDL 70mg , HDL >40mg .   Expected Outcomes Short Term: Participant states understanding of desired cholesterol values and is compliant with medications prescribed. Participant is following exercise prescription and nutrition guidelines.;Long Term: Cholesterol controlled with medications as prescribed, with individualized exercise RX and with personalized nutrition plan. Value goals: LDL < 70mg , HDL > 40 mg.   Stress Yes   Intervention Offer individual and/or small group education and counseling on adjustment to heart disease, stress management and health-related lifestyle change. Teach and support self-help strategies.;Refer participants experiencing significant psychosocial distress to appropriate mental health specialists for further evaluation and treatment. When possible, include family members and significant others in education/counseling sessions.   Expected Outcomes Short Term: Participant demonstrates changes in health-related behavior, relaxation and other stress management skills, ability to obtain effective social support, and compliance with psychotropic medications if prescribed.;Long Term: Emotional wellbeing is indicated by absence of clinically significant psychosocial distress or social isolation.      Core Components/Risk Factors/Patient Goals Review:      Goals and Risk Factor Review    Row Name 07/27/17 1009             Core Components/Risk Factors/Patient Goals  Review   Personal Goals Review Weight Management/Obesity;Lipids;Hypertension;Stress       Review -  Aldan has been doing well with exercise at cardiac rehab, Lorn's vital sign have been stable       Expected Outcomes Eryc will continue to exercise at cardiac rehab and take his medications as presribed          Core Components/Risk Factors/Patient Goals at Discharge (Final Review):      Goals and Risk Factor Review - 07/27/17 1009      Core Components/Risk Factors/Patient Goals Review   Personal Goals Review Weight Management/Obesity;Lipids;Hypertension;Stress   Review --  Kerem has been doing well with exercise at cardiac rehab, Boubacar's vital sign have been stable   Expected Outcomes Brailon will continue to exercise at cardiac rehab and take his medications as presribed      ITP Comments:     ITP Comments    Row Name 07/06/17 0815           ITP Comments Dr. Fransico Him, Medical Director          Comments: Eivin is making expected progress toward personal goals after completing 7 sessions. Recommend continued exercise and life style modification education including  stress management and relaxation techniques to decrease cardiac risk profile. Almon is doing well with exercise and is off to a good start with exercise.Barnet Pall, RN,BSN 07/27/2017 10:14 AM

## 2017-07-28 ENCOUNTER — Encounter (HOSPITAL_COMMUNITY)
Admission: RE | Admit: 2017-07-28 | Discharge: 2017-07-28 | Disposition: A | Discharge status: Home / Self Care | Payer: BLUE CROSS/BLUE SHIELD | Source: Ambulatory Visit | Attending: Cardiology | Admitting: Cardiology

## 2017-07-28 ENCOUNTER — Encounter (HOSPITAL_COMMUNITY): Payer: BLUE CROSS/BLUE SHIELD

## 2017-07-28 DIAGNOSIS — Z951 Presence of aortocoronary bypass graft: Secondary | ICD-10-CM | POA: Diagnosis not present

## 2017-07-31 ENCOUNTER — Encounter (HOSPITAL_COMMUNITY)
Admission: RE | Admit: 2017-07-31 | Discharge: 2017-07-31 | Disposition: A | Discharge status: Home / Self Care | Payer: BLUE CROSS/BLUE SHIELD | Source: Ambulatory Visit | Attending: Cardiology | Admitting: Cardiology

## 2017-07-31 ENCOUNTER — Encounter (HOSPITAL_COMMUNITY): Payer: BLUE CROSS/BLUE SHIELD

## 2017-07-31 DIAGNOSIS — Z951 Presence of aortocoronary bypass graft: Secondary | ICD-10-CM | POA: Diagnosis not present

## 2017-08-02 ENCOUNTER — Encounter (HOSPITAL_COMMUNITY): Payer: BLUE CROSS/BLUE SHIELD

## 2017-08-02 ENCOUNTER — Encounter (HOSPITAL_COMMUNITY)
Admission: RE | Admit: 2017-08-02 | Discharge: 2017-08-02 | Disposition: A | Discharge status: Home / Self Care | Payer: BLUE CROSS/BLUE SHIELD | Source: Ambulatory Visit | Attending: Cardiology | Admitting: Cardiology

## 2017-08-02 DIAGNOSIS — Z951 Presence of aortocoronary bypass graft: Secondary | ICD-10-CM

## 2017-08-04 ENCOUNTER — Encounter (HOSPITAL_COMMUNITY)
Admission: RE | Admit: 2017-08-04 | Discharge: 2017-08-04 | Disposition: A | Discharge status: Home / Self Care | Payer: BLUE CROSS/BLUE SHIELD | Source: Ambulatory Visit | Attending: Cardiology | Admitting: Cardiology

## 2017-08-04 ENCOUNTER — Encounter (HOSPITAL_COMMUNITY): Payer: BLUE CROSS/BLUE SHIELD

## 2017-08-04 DIAGNOSIS — Z951 Presence of aortocoronary bypass graft: Secondary | ICD-10-CM

## 2017-08-07 ENCOUNTER — Encounter (HOSPITAL_COMMUNITY): Payer: BLUE CROSS/BLUE SHIELD

## 2017-08-07 ENCOUNTER — Encounter (HOSPITAL_COMMUNITY)
Admission: RE | Admit: 2017-08-07 | Discharge: 2017-08-07 | Disposition: A | Discharge status: Home / Self Care | Payer: BLUE CROSS/BLUE SHIELD | Source: Ambulatory Visit | Attending: Cardiology | Admitting: Cardiology

## 2017-08-07 DIAGNOSIS — Z951 Presence of aortocoronary bypass graft: Secondary | ICD-10-CM | POA: Diagnosis not present

## 2017-08-09 ENCOUNTER — Encounter (HOSPITAL_COMMUNITY)
Admission: RE | Admit: 2017-08-09 | Discharge: 2017-08-09 | Disposition: A | Discharge status: Home / Self Care | Payer: BLUE CROSS/BLUE SHIELD | Source: Ambulatory Visit | Attending: Cardiology | Admitting: Cardiology

## 2017-08-09 ENCOUNTER — Encounter (HOSPITAL_COMMUNITY): Payer: BLUE CROSS/BLUE SHIELD

## 2017-08-09 DIAGNOSIS — Z951 Presence of aortocoronary bypass graft: Secondary | ICD-10-CM

## 2017-08-11 ENCOUNTER — Encounter (HOSPITAL_COMMUNITY): Payer: BLUE CROSS/BLUE SHIELD

## 2017-08-11 ENCOUNTER — Encounter (HOSPITAL_COMMUNITY)
Admission: RE | Admit: 2017-08-11 | Discharge: 2017-08-11 | Disposition: A | Discharge status: Home / Self Care | Payer: BLUE CROSS/BLUE SHIELD | Source: Ambulatory Visit | Attending: Cardiology | Admitting: Cardiology

## 2017-08-11 DIAGNOSIS — Z951 Presence of aortocoronary bypass graft: Secondary | ICD-10-CM | POA: Diagnosis not present

## 2017-08-14 ENCOUNTER — Encounter (HOSPITAL_COMMUNITY)
Admission: RE | Admit: 2017-08-14 | Discharge: 2017-08-14 | Disposition: A | Discharge status: Home / Self Care | Payer: BLUE CROSS/BLUE SHIELD | Source: Ambulatory Visit | Attending: Cardiology | Admitting: Cardiology

## 2017-08-14 ENCOUNTER — Encounter (HOSPITAL_COMMUNITY): Payer: BLUE CROSS/BLUE SHIELD

## 2017-08-14 ENCOUNTER — Telehealth: Payer: Self-pay | Admitting: Cardiology

## 2017-08-14 DIAGNOSIS — Z951 Presence of aortocoronary bypass graft: Secondary | ICD-10-CM | POA: Insufficient documentation

## 2017-08-14 NOTE — Telephone Encounter (Signed)
New message  Pt verbalized that he is calling to speak to rn he has some questions  Can pt return to work? Can pt play softball, and if he gets hit in the chest will a ball will it be okay? Can pt do lawn work?

## 2017-08-14 NOTE — Telephone Encounter (Signed)
Pt notified of Dr Jacalyn Lefevre message.  Pt states that he is having what he thinks is muscle pain it is located "about even with the nipple area off to the left" and pt states that he is still having numbness in right leg where the vein harvesting area was done by the ankle. Is this normal? Surgery was 05-09-17, please advise

## 2017-08-14 NOTE — Telephone Encounter (Signed)
Left message for pt to call.

## 2017-08-14 NOTE — Telephone Encounter (Signed)
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

## 2017-08-15 NOTE — Telephone Encounter (Signed)
Pt notified of Dr Jacalyn Lefevre message

## 2017-08-15 NOTE — Telephone Encounter (Signed)
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

## 2017-08-15 NOTE — Telephone Encounter (Signed)
Follow up     Patient returning call to nurse. Please call

## 2017-08-16 ENCOUNTER — Encounter (HOSPITAL_COMMUNITY)
Admission: RE | Admit: 2017-08-16 | Discharge: 2017-08-16 | Disposition: A | Discharge status: Home / Self Care | Payer: BLUE CROSS/BLUE SHIELD | Source: Ambulatory Visit | Attending: Cardiology | Admitting: Cardiology

## 2017-08-16 ENCOUNTER — Encounter (HOSPITAL_COMMUNITY): Payer: BLUE CROSS/BLUE SHIELD

## 2017-08-16 DIAGNOSIS — Z951 Presence of aortocoronary bypass graft: Secondary | ICD-10-CM

## 2017-08-18 ENCOUNTER — Encounter (HOSPITAL_COMMUNITY): Payer: BLUE CROSS/BLUE SHIELD

## 2017-08-18 ENCOUNTER — Encounter (HOSPITAL_COMMUNITY)
Admission: RE | Admit: 2017-08-18 | Discharge: 2017-08-18 | Disposition: A | Discharge status: Home / Self Care | Payer: BLUE CROSS/BLUE SHIELD | Source: Ambulatory Visit | Attending: Cardiology | Admitting: Cardiology

## 2017-08-18 DIAGNOSIS — Z951 Presence of aortocoronary bypass graft: Secondary | ICD-10-CM

## 2017-08-21 ENCOUNTER — Encounter (HOSPITAL_COMMUNITY)
Admission: RE | Admit: 2017-08-21 | Discharge: 2017-08-21 | Disposition: A | Discharge status: Home / Self Care | Payer: BLUE CROSS/BLUE SHIELD | Source: Ambulatory Visit | Attending: Cardiology | Admitting: Cardiology

## 2017-08-21 ENCOUNTER — Encounter (HOSPITAL_COMMUNITY): Payer: BLUE CROSS/BLUE SHIELD

## 2017-08-21 DIAGNOSIS — Z951 Presence of aortocoronary bypass graft: Secondary | ICD-10-CM

## 2017-08-23 ENCOUNTER — Encounter (HOSPITAL_COMMUNITY)
Admission: RE | Admit: 2017-08-23 | Discharge: 2017-08-23 | Disposition: A | Discharge status: Home / Self Care | Payer: BLUE CROSS/BLUE SHIELD | Source: Ambulatory Visit | Attending: Cardiology | Admitting: Cardiology

## 2017-08-23 ENCOUNTER — Encounter (HOSPITAL_COMMUNITY): Payer: BLUE CROSS/BLUE SHIELD

## 2017-08-23 DIAGNOSIS — Z951 Presence of aortocoronary bypass graft: Secondary | ICD-10-CM

## 2017-08-23 NOTE — Progress Notes (Signed)
Cardiac Individual Treatment Plan  Patient Details  Name: Dominic Ray MRN: [id number] Date of Birth: February 04, 1956 Referring Provider:     CARDIAC REHAB PHASE II ORIENTATION from 07/06/2017 in [place]  Referring Provider  Kirk Ruths MD      Initial Encounter Date:    CARDIAC REHAB PHASE II ORIENTATION from 07/06/2017 in [place]  Date  07/06/17  Referring Provider  Kirk Ruths MD      Visit Diagnosis: 05/09/17 S/P CABG x 4  Patient's Home Medications on Admission:  Current Outpatient Prescriptions:  .  aspirin EC 325 MG EC tablet, Take 1 tablet (325 mg total) by mouth daily., Disp: 30 tablet, Rfl: 0 .  atorvastatin (LIPITOR) 80 MG tablet, Take 1 tablet (80 mg total) by mouth daily at 6 PM., Disp: 30 tablet, Rfl: 12 .  metoprolol tartrate (LOPRESSOR) 25 MG tablet, Take 1 tablet (25 mg total) by mouth 2 (two) times daily., Disp: 60 tablet, Rfl: 12  Past Medical History: Past Medical History:  Diagnosis Date  . Basal cell carcinoma of nasal tip 08/24/2012  . Coronary artery disease    a. Abnl stress test -> s/p CABGx4 in 04/2017 with LIMA-LAD, SVG-diag, SVG-AM, SVG-PDA.  Marland Kitchen Hyperlipidemia   . Hypertension   . Male hypogonadism 08/24/2012  . Onychomycosis 08/24/2012    Tobacco Use: History  Smoking Status  . Never Smoker  Smokeless Tobacco  . Never Used    Labs: Recent Review Flowsheet Data    Labs for ITP Cardiac and Pulmonary Rehab Latest Ref Rng & Units 05/09/2017 05/09/2017 05/09/2017 05/10/2017 06/22/2017   Cholestrol 100 - 199 mg/dL - - - - 68(L)   LDLCALC 0 - 99 mg/dL - - - - 26   HDL >39 mg/dL - - - - 31(L)   Trlycerides 0 - 149 mg/dL - - - - 55   Hemoglobin A1c 4.8 - 5.6 % - - - - -   PHART 7.350 - 7.450 7.332(L) 7.356 - - -   PCO2ART 32.0 - 48.0 mmHg 47.5 43.1 - - -   HCO3 20.0 - 28.0 mmol/L 25.0 24.0 - - -   TCO2 0 - 100 mmol/L 26 25 25 28  -   ACIDBASEDEF 0.0 - 2.0 mmol/L 1.0 1.0 - - -    O2SAT % 98.0 93.0 - - -      Capillary Blood Glucose: Lab Results  Component Value Date   GLUCAP 97 05/11/2017   GLUCAP 112 (H) 05/11/2017   GLUCAP 110 (H) 05/11/2017   GLUCAP 131 (H) 05/10/2017   GLUCAP 157 (H) 05/10/2017     Exercise Target Goals:    Exercise Program Goal: Individual exercise prescription set with THRR, safety & activity barriers. Participant demonstrates ability to understand and report RPE using BORG scale, to self-measure pulse accurately, and to acknowledge the importance of the exercise prescription.  Exercise Prescription Goal: Starting with aerobic activity 30 plus minutes a day, 3 days per week for initial exercise prescription. Provide home exercise prescription and guidelines that participant acknowledges understanding prior to discharge.  Activity Barriers & Risk Stratification:     Activity Barriers & Cardiac Risk Stratification - 07/06/17 0838      Activity Barriers & Cardiac Risk Stratification   Activity Barriers Arthritis;Back Problems   Cardiac Risk Stratification High      6 Minute Walk:     6 Minute Walk    Row Name 07/06/17 1000  6 Minute Walk   Phase Initial     Distance 18888 feet     Walk Time 6 minutes     # of Rest Breaks 0     MPH 3.68     METS 4.19     RPE 9     VO2 Peak 14.68     Symptoms No     Resting HR 57 bpm     Resting BP 138/70     Max Ex. HR 93 bpm     Max Ex. BP 134/82     2 Minute Post BP 124/70        Oxygen Initial Assessment:   Oxygen Re-Evaluation:   Oxygen Discharge (Final Oxygen Re-Evaluation):   Initial Exercise Prescription:     Initial Exercise Prescription - 07/06/17 1000      Date of Initial Exercise RX and Referring Provider   Date 07/06/17   Referring Provider Kirk Ruths MD     Treadmill   MPH 3.1   Grade 1   Minutes 10   METs 3.8     Bike   Level 1   Minutes 10   METs 2.99     NuStep   Level 3   SPM 80   Minutes 10   METs 2.5     Arm  Ergometer   Level 2   Minutes 10   METs 2.5     Prescription Details   Frequency (times per week) 3   Duration Progress to 30 minutes of continuous aerobic without signs/symptoms of physical distress     Intensity   THRR 40-80% of Max Heartrate 64-128   Ratings of Perceived Exertion 11-13   Perceived Dyspnea 0-4     Progression   Progression Continue progressive overload as per policy without signs/symptoms or physical distress.     Resistance Training   Training Prescription Yes   Weight 4lbs   Reps 10-15      Perform Capillary Blood Glucose checks as needed.  Exercise Prescription Changes:     Exercise Prescription Changes    Row Name 07/10/17 1700 07/25/17 1000 08/07/17 1700 08/21/17 1500       Response to Exercise   Blood Pressure (Admit) 122/62 104/72 114/64 102/64    Blood Pressure (Exercise) 170/84 126/80 144/74 120/72    Blood Pressure (Exit) 108/60 110/60 104/60 104/64    Heart Rate (Admit) 58 bpm 55 bpm 86 bpm 68 bpm    Heart Rate (Exercise) 92 bpm 106 bpm 134 bpm 111 bpm    Heart Rate (Exit) 58 bpm 56 bpm 82 bpm 65 bpm    Rating of Perceived Exertion (Exercise) 12 11 12 12     Symptoms  - none none none    Duration Progress to 30 minutes of  aerobic without signs/symptoms of physical distress Progress to 45 minutes of aerobic exercise without signs/symptoms of physical distress Progress to 45 minutes of aerobic exercise without signs/symptoms of physical distress Progress to 45 minutes of aerobic exercise without signs/symptoms of physical distress    Intensity THRR unchanged THRR unchanged THRR unchanged THRR unchanged      Progression   Progression Continue to progress workloads to maintain intensity without signs/symptoms of physical distress. Continue to progress workloads to maintain intensity without signs/symptoms of physical distress. Continue to progress workloads to maintain intensity without signs/symptoms of physical distress. Continue to progress  workloads to maintain intensity without signs/symptoms of physical distress.    Average METs 3.1 4.7 4.5 4.7  Resistance Training   Training Prescription Yes Yes Yes Yes    Weight 2lbs 4lbs 4lbs 4lbs    Reps 10-15 10-15 10-15 10-15    Time  -  - 10 Minutes 10 Minutes      Interval Training   Interval Training No No No No      Treadmill   MPH 3.1 3.6 3.6 3.6    Grade 1 3 3 3     Minutes 10 10 10 10     METs 3.8 4.5 5.25 5.25      Bike   Level 1 2 2 2     Minutes 10 10 10 10     METs 3.02 5.1 5.15 5.16      NuStep   Level - 5 5 6     SPM - 90 90 90    Minutes - 10 10 10     METs - 4.5 3.2 3.7      Arm Ergometer   Level 2  -  -  -    Minutes 10  -  -  -    METs 2.4  -  -  -      Home Exercise Plan   Plans to continue exercise at  - Home (comment) Home (comment) Home (comment)    Frequency  - Add 2 additional days to program exercise sessions. Add 2 additional days to program exercise sessions. Add 2 additional days to program exercise sessions.    Initial Home Exercises Provided  - 07/21/17 07/21/17 07/21/17       Exercise Comments:     Exercise Comments    Row Name 07/21/17 1110 07/26/17 1110 08/09/17 1123       Exercise Comments Reviewed home exercise guidelines. Pt is walking and has access to a gym where he plans to use the pool and exercise equipment for his home exercise. Reviewed METs and goals with patient. Reviewed METs with patient.        Exercise Goals and Review:     Exercise Goals    Row Name 07/06/17 0839 07/06/17 1019           Exercise Goals   Increase Physical Activity Yes  -      Intervention Provide advice, education, support and counseling about physical activity/exercise needs.;Develop an individualized exercise prescription for aerobic and resistive training based on initial evaluation findings, risk stratification, comorbidities and participant's personal goals.  -      Expected Outcomes Achievement of increased cardiorespiratory  fitness and enhanced flexibility, muscular endurance and strength shown through measurements of functional capacity and personal statement of participant.  -      Increase Strength and Stamina Yes  increase muscle tone and get stronger Yes  return to jogging, increase muscle tone      Intervention Provide advice, education, support and counseling about physical activity/exercise needs.;Develop an individualized exercise prescription for aerobic and resistive training based on initial evaluation findings, risk stratification, comorbidities and participant's personal goals.  -      Expected Outcomes Achievement of increased cardiorespiratory fitness and enhanced flexibility, muscular endurance and strength shown through measurements of functional capacity and personal statement of participant.  -         Exercise Goals Re-Evaluation :     Exercise Goals Re-Evaluation    Row Name 07/26/17 1110             Exercise Goal Re-Evaluation   Exercise Goals Review Increase Physical Activity;Increase Strength and Stamina;Knowledge and understanding of Target  Heart Rate Range (THRR);Able to understand and use rate of perceived exertion (RPE) scale       Comments Pt states he's "slowly but surely: getting stronger. Pt has returned to some exercise at a gym in addition to CR.       Expected Outcomes Increase workloads as tolerated. Return to exercise routine at fitness center.           Discharge Exercise Prescription (Final Exercise Prescription Changes):     Exercise Prescription Changes - 08/21/17 1500      Response to Exercise   Blood Pressure (Admit) 102/64   Blood Pressure (Exercise) 120/72   Blood Pressure (Exit) 104/64   Heart Rate (Admit) 68 bpm   Heart Rate (Exercise) 111 bpm   Heart Rate (Exit) 65 bpm   Rating of Perceived Exertion (Exercise) 12   Symptoms none   Duration Progress to 45 minutes of aerobic exercise without signs/symptoms of physical distress   Intensity THRR  unchanged     Progression   Progression Continue to progress workloads to maintain intensity without signs/symptoms of physical distress.   Average METs 4.7     Resistance Training   Training Prescription Yes   Weight 4lbs   Reps 10-15   Time 10 Minutes     Interval Training   Interval Training No     Treadmill   MPH 3.6   Grade 3   Minutes 10   METs 5.25     Bike   Level 2   Minutes 10   METs 5.16     NuStep   Level 6   SPM 90   Minutes 10   METs 3.7     Home Exercise Plan   Plans to continue exercise at Home (comment)   Frequency Add 2 additional days to program exercise sessions.   Initial Home Exercises Provided 07/21/17      Nutrition:  Target Goals: Understanding of nutrition guidelines, daily intake of sodium 1500mg , cholesterol 200mg , calories 30% from fat and 7% or less from saturated fats, daily to have 5 or more servings of fruits and vegetables.  Biometrics:     Pre Biometrics - 07/06/17 1017      Pre Biometrics   Waist Circumference 39.5 inches   Hip Circumference 41 inches   Waist to Hip Ratio 0.96 %   Triceps Skinfold 19 mm   % Body Fat 29.5 %   Grip Strength 33 kg   Flexibility 14 in   Single Leg Stand 30 seconds       Nutrition Therapy Plan and Nutrition Goals:     Nutrition Therapy & Goals - 07/06/17 1224      Nutrition Therapy   Diet Therapeutic Lifestyle Changes     Personal Nutrition Goals   Nutrition Goal Pt to identify food quantities necessary to achieve weight loss of 6-12 lb at graduation from cardiac rehab. Goal wt of 188 lb desired.      Intervention Plan   Intervention Prescribe, educate and counsel regarding individualized specific dietary modifications aiming towards targeted core components such as weight, hypertension, lipid management, diabetes, heart failure and other comorbidities.   Expected Outcomes Short Term Goal: Understand basic principles of dietary content, such as calories, fat, sodium, cholesterol  and nutrients.;Long Term Goal: Adherence to prescribed nutrition plan.      Nutrition Discharge: Nutrition Scores:     Nutrition Assessments - 07/06/17 1224      MEDFICTS Scores   Pre Score 6  Nutrition Goals Re-Evaluation:   Nutrition Goals Re-Evaluation:   Nutrition Goals Discharge (Final Nutrition Goals Re-Evaluation):   Psychosocial: Target Goals: Acknowledge presence or absence of significant depression and/or stress, maximize coping skills, provide positive support system. Participant is able to verbalize types and ability to use techniques and skills needed for reducing stress and depression.  Initial Review & Psychosocial Screening:     Initial Psych Review & Screening - 07/06/17 1244      Initial Review   Current issues with Current Stress Concerns   Source of Stress Concerns Occupation;Financial   Comments Pt is not working-unemployed per vocational rehab intake form     [place]? Yes     Barriers   Psychosocial barriers to participate in program The patient should benefit from training in stress management and relaxation.     Screening Interventions   Interventions Encouraged to exercise      Quality of Life Scores:     Quality of Life - 07/06/17 1018      Quality of Life Scores   Health/Function Pre 23.3 %   Socioeconomic Pre 24.86 %   Psych/Spiritual Pre 23.29 %   Family Pre 25.5 %   GLOBAL Pre 23.89 %      PHQ-9: Recent Review Flowsheet Data    Depression screen [facility] 2/9 07/10/2017   Decreased Interest 0   Down, Depressed, Hopeless 0   PHQ - 2 Score 0     Interpretation of Total Score  Total Score Depression Severity:  1-4 = Minimal depression, 5-9 = Mild depression, 10-14 = Moderate depression, 15-19 = Moderately severe depression, 20-27 = Severe depression   Psychosocial Evaluation and Intervention:   Psychosocial Re-Evaluation:     Psychosocial Re-Evaluation    [place] Name 07/27/17 1011  08/23/17 1702           Psychosocial Re-Evaluation   Current issues with None Identified None Identified      Interventions Encouraged to attend Cardiac Rehabilitation for the exercise Encouraged to attend Cardiac Rehabilitation for the exercise      Continue Psychosocial Services  No Follow up required No Follow up required         Psychosocial Discharge (Final Psychosocial Re-Evaluation):     Psychosocial Re-Evaluation - 08/23/17 1702      Psychosocial Re-Evaluation   Current issues with None Identified   Interventions Encouraged to attend Cardiac Rehabilitation for the exercise   Continue Psychosocial Services  No Follow up required      Vocational Rehabilitation: Provide vocational rehab assistance to qualifying candidates.   Vocational Rehab Evaluation & Intervention:     Vocational Rehab - 07/13/17 1507      Initial Vocational Rehab Evaluation & Intervention   Assessment shows need for Vocational Rehabilitation No     Vocational Rehab Re-Evaulation   Comments --  Dominic Ray says he does not need vocational rehab at this time and changed his mind after reviewing the packet.      Education: Education Goals: Education classes will be provided on a weekly basis, covering required topics. Participant will state understanding/return demonstration of topics presented.  Learning Barriers/Preferences:     Learning Barriers/Preferences - 07/06/17 0623      Learning Barriers/Preferences   Learning Barriers Sight   Learning Preferences Written Material;Skilled Demonstration      Education Topics: Count Your Pulse:  -Group instruction provided by verbal instruction, demonstration, patient participation and written materials to support subject.  Instructors address importance  of being able to find your pulse and how to count your pulse when at home without a heart monitor.  Patients get hands on experience counting their pulse with staff help and individually.   CARDIAC  REHAB PHASE II EXERCISE from 08/23/2017 in [place]  Date  07/21/17  Instruction Review Code  2- meets goals/outcomes      Heart Attack, Angina, and Risk Factor Modification:  -Group instruction provided by verbal instruction, video, and written materials to support subject.  Instructors address signs and symptoms of angina and heart attacks.    Also discuss risk factors for heart disease and how to make changes to improve heart health risk factors.   CARDIAC REHAB PHASE II EXERCISE from 08/23/2017 in [place]  Date  08/02/17  Instruction Review Code  2- meets goals/outcomes      Functional Fitness:  -Group instruction provided by verbal instruction, demonstration, patient participation, and written materials to support subject.  Instructors address safety measures for doing things around the house.  Discuss how to get up and down off the floor, how to pick things up properly, how to safely get out of a chair without assistance, and balance training.   Meditation and Mindfulness:  -Group instruction provided by verbal instruction, patient participation, and written materials to support subject.  Instructor addresses importance of mindfulness and meditation practice to help reduce stress and improve awareness.  Instructor also leads participants through a meditation exercise.    Stretching for Flexibility and Mobility:  -Group instruction provided by verbal instruction, patient participation, and written materials to support subject.  Instructors lead participants through series of stretches that are designed to increase flexibility thus improving mobility.  These stretches are additional exercise for major muscle groups that are typically performed during regular warm up and cool down.   CARDIAC REHAB PHASE II EXERCISE from 08/23/2017 in [place]  Date  08/18/17  Instruction Review Code  2-  meets goals/outcomes      Hands Only CPR:  -Group verbal, video, and participation provides a basic overview of AHA guidelines for community CPR. Role-play of emergencies allow participants the opportunity to practice calling for help and chest compression technique with discussion of AED use.   Hypertension: -Group verbal and written instruction that provides a basic overview of hypertension including the most recent diagnostic guidelines, risk factor reduction with self-care instructions and medication management.   CARDIAC REHAB PHASE II EXERCISE from 08/23/2017 in [place]  Date  08/11/17  Instruction Review Code  2- meets goals/outcomes       Nutrition I class: Heart Healthy Eating:  -Group instruction provided by PowerPoint slides, verbal discussion, and written materials to support subject matter. The instructor gives an explanation and review of the Therapeutic Lifestyle Changes diet recommendations, which includes a discussion on lipid goals, dietary fat, sodium, fiber, plant stanol/sterol esters, sugar, and the components of a well-balanced, healthy diet.   CARDIAC REHAB PHASE II EXERCISE from 08/23/2017 in [place]  Date  08/15/17  Educator  RD  Instruction Review Code  2- meets goals/outcomes      Nutrition II class: Lifestyle Skills:  -Group instruction provided by PowerPoint slides, verbal discussion, and written materials to support subject matter. The instructor gives an explanation and review of label reading, grocery shopping for heart health, heart healthy recipe modifications, and ways to make healthier choices when  eating out.   CARDIAC REHAB PHASE II EXERCISE from 08/23/2017 in [place]  Date  07/25/17  Educator  RD  Instruction Review Code  2- meets goals/outcomes      Diabetes Question & Answer:  -Group instruction provided by PowerPoint slides, verbal  discussion, and written materials to support subject matter. The instructor gives an explanation and review of diabetes co-morbidities, pre- and post-prandial blood glucose goals, pre-exercise blood glucose goals, signs, symptoms, and treatment of hypoglycemia and hyperglycemia, and foot care basics.   Diabetes Blitz:  -Group instruction provided by PowerPoint slides, verbal discussion, and written materials to support subject matter. The instructor gives an explanation and review of the physiology behind type 1 and type 2 diabetes, diabetes medications and rational behind using different medications, pre- and post-prandial blood glucose recommendations and Hemoglobin A1c goals, diabetes diet, and exercise including blood glucose guidelines for exercising safely.    Portion Distortion:  -Group instruction provided by PowerPoint slides, verbal discussion, written materials, and food models to support subject matter. The instructor gives an explanation of serving size versus portion size, changes in portions sizes over the last 20 years, and what consists of a serving from each food group.   Stress Management:  -Group instruction provided by verbal instruction, video, and written materials to support subject matter.  Instructors review role of stress in heart disease and how to cope with stress positively.     Exercising on Your Own:  -Group instruction provided by verbal instruction, power point, and written materials to support subject.  Instructors discuss benefits of exercise, components of exercise, frequency and intensity of exercise, and end points for exercise.  Also discuss use of nitroglycerin and activating EMS.  Review options of places to exercise outside of rehab.  Review guidelines for sex with heart disease.   CARDIAC REHAB PHASE II EXERCISE from 08/23/2017 in [place]  Date  07/19/17  Instruction Review Code  2- meets goals/outcomes      Cardiac  Drugs I:  -Group instruction provided by verbal instruction and written materials to support subject.  Instructor reviews cardiac drug classes: antiplatelets, anticoagulants, beta blockers, and statins.  Instructor discusses reasons, side effects, and lifestyle considerations for each drug class.   Cardiac Drugs II:  -Group instruction provided by verbal instruction and written materials to support subject.  Instructor reviews cardiac drug classes: angiotensin converting enzyme inhibitors (ACE-I), angiotensin II receptor blockers (ARBs), nitrates, and calcium channel blockers.  Instructor discusses reasons, side effects, and lifestyle considerations for each drug class.   CARDIAC REHAB PHASE II EXERCISE from 08/23/2017 in [place]  Date  08/23/17  Instruction Review Code  2- meets goals/outcomes      Anatomy and Physiology of the Circulatory System:  Group verbal and written instruction and models provide basic cardiac anatomy and physiology, with the coronary electrical and arterial systems. Review of: AMI, Angina, Valve disease, Heart Failure, Peripheral Artery Disease, Cardiac Arrhythmia, Pacemakers, and the ICD.   CARDIAC REHAB PHASE II EXERCISE from 08/23/2017 in [place]  Date  08/09/17  Instruction Review Code  2- meets goals/outcomes      Other Education:  -Group or individual verbal, written, or video instructions that support the educational goals of the cardiac rehab program.   Knowledge Questionnaire Score:     Knowledge Questionnaire Score - 07/06/17 1000      Knowledge Questionnaire Score   Pre  Score 25/28      Core Components/Risk Factors/Patient Goals at Admission:     Personal Goals and Risk Factors at Admission - 07/06/17 1017      Core Components/Risk Factors/Patient Goals on Admission    Weight Management Yes;Obesity;Weight Loss;Weight Maintenance   Intervention Weight Management: Develop  a combined nutrition and exercise program designed to reach desired caloric intake, while maintaining appropriate intake of nutrient and fiber, sodium and fats, and appropriate energy expenditure required for the weight goal.;Weight Management: Provide education and appropriate resources to help participant work on and attain dietary goals.;Weight Management/Obesity: Establish reasonable short term and long term weight goals.;Obesity: Provide education and appropriate resources to help participant work on and attain dietary goals.   Expected Outcomes Short Term: Continue to assess and modify interventions until short term weight is achieved;Long Term: Adherence to nutrition and physical activity/exercise program aimed toward attainment of established weight goal;Weight Maintenance: Understanding of the daily nutrition guidelines, which includes 25-35% calories from fat, 7% or less cal from saturated fats, less than 200mg  cholesterol, less than 1.5gm of sodium, & 5 or more servings of fruits and vegetables daily;Understanding recommendations for meals to include 15-35% energy as protein, 25-35% energy from fat, 35-60% energy from carbohydrates, less than 200mg  of dietary cholesterol, 20-35 gm of total fiber daily;Weight Loss: Understanding of general recommendations for a balanced deficit meal plan, which promotes 1-2 lb weight loss per week and includes a negative energy balance of [phone] kcal/d;Understanding of distribution of calorie intake throughout the day with the consumption of 4-5 meals/snacks   Hypertension Yes   Intervention Provide education on lifestyle modifcations including regular physical activity/exercise, weight management, moderate sodium restriction and increased consumption of fresh fruit, vegetables, and low fat dairy, alcohol moderation, and smoking cessation.;Monitor prescription use compliance.   Expected Outcomes Short Term: Continued assessment and intervention until BP is < 140/69mm  HG in hypertensive participants. < 130/41mm HG in hypertensive participants with diabetes, heart failure or chronic kidney disease.;Long Term: Maintenance of blood pressure at goal levels.   Lipids Yes   Intervention Provide education and support for participant on nutrition & aerobic/resistive exercise along with prescribed medications to achieve LDL 70mg , HDL >40mg .   Expected Outcomes Short Term: Participant states understanding of desired cholesterol values and is compliant with medications prescribed. Participant is following exercise prescription and nutrition guidelines.;Long Term: Cholesterol controlled with medications as prescribed, with individualized exercise RX and with personalized nutrition plan. Value goals: LDL < 70mg , HDL > 40 mg.   Stress Yes   Intervention Offer individual and/or small group education and counseling on adjustment to heart disease, stress management and health-related lifestyle change. Teach and support self-help strategies.;Refer participants experiencing significant psychosocial distress to appropriate mental health specialists for further evaluation and treatment. When possible, include family members and significant others in education/counseling sessions.   Expected Outcomes Short Term: Participant demonstrates changes in health-related behavior, relaxation and other stress management skills, ability to obtain effective social support, and compliance with psychotropic medications if prescribed.;Long Term: Emotional wellbeing is indicated by absence of clinically significant psychosocial distress or social isolation.      Core Components/Risk Factors/Patient Goals Review:      Goals and Risk Factor Review    Row Name 07/27/17 1009 08/23/17 1700           Core Components/Risk Factors/Patient Goals Review   Personal Goals Review Weight Management/Obesity;Lipids;Hypertension;Stress Weight Management/Obesity;Lipids;Hypertension;Stress      Review -  Dominic Ray has  been doing well with  exercise at cardiac rehab, Dominic Ray's vital sign have been stable Dominic Ray continues to do well with exercise. Dominic Ray's vital signs have been stable       Expected Outcomes Dominic Ray will continue to exercise at cardiac rehab and take his medications as presribed Dominic Ray will continue to exercise at cardiac rehab and take his medications as presribed         Core Components/Risk Factors/Patient Goals at Discharge (Final Review):      Goals and Risk Factor Review - 08/23/17 1700      Core Components/Risk Factors/Patient Goals Review   Personal Goals Review Weight Management/Obesity;Lipids;Hypertension;Stress   Review Tregan continues to do well with exercise. Lovett's vital signs have been stable    Expected Outcomes Jakwan will continue to exercise at cardiac rehab and take his medications as presribed      ITP Comments:     ITP Comments    Row Name 07/06/17 0815           ITP Comments Dr. Fransico Him, Medical Director          Comments: Vestal is making expected progress toward personal goals after completing 20 sessions. Recommend continued exercise and life style modification education including  stress management and relaxation techniques to decrease cardiac risk profile. Nickolaus continues to do well with exercise.Barnet Pall, RN,BSN 08/23/2017 5:06 PM

## 2017-08-25 ENCOUNTER — Encounter (HOSPITAL_COMMUNITY)
Admission: RE | Admit: 2017-08-25 | Discharge: 2017-08-25 | Disposition: A | Discharge status: Home / Self Care | Payer: BLUE CROSS/BLUE SHIELD | Source: Ambulatory Visit | Attending: Cardiology | Admitting: Cardiology

## 2017-08-25 ENCOUNTER — Encounter (HOSPITAL_COMMUNITY): Payer: BLUE CROSS/BLUE SHIELD

## 2017-08-25 DIAGNOSIS — Z951 Presence of aortocoronary bypass graft: Secondary | ICD-10-CM | POA: Diagnosis not present

## 2017-08-25 NOTE — Progress Notes (Signed)
Dominic Ray 62 y.o. male DOB: [birth year] MRN: [id number]      Nutrition Note  S/P CABG Note Spoke with pt. Nutrition plan reviewed with pt. Pt trying to form a plan for eating healthy when he returns to work full-time. Pt states he has returned to work part-time and eating healthy has not been a problem so far. Plan for eating healthy when pt back to working full-time discussed. Pt expressed understanding of the information reviewed.   Nutrition Diagnosis ? Food-and nutrition-related knowledge deficit related to lack of exposure to information as related to diagnosis of: ? CVD  ? Obesity related to excessive energy intake as evidenced by a BMI of 31.6  Nutrition Intervention ? Pt's individual nutrition plan and goals reviewed with pt.  Nutrition Goal(s):  ? Pt to identify food quantities necessary to achieve weight loss of 6-12 lb at graduation from cardiac rehab. Goal wt of 188 lb desired.   Plan:  Pt to attend nutrition classes ? Nutrition I- met 07/18/17 ? Nutrition II - met 07/25/17 ? Portion Distortion  Will provide client-centered nutrition education as part of interdisciplinary care.   Monitor and evaluate progress toward nutrition goal with team.  Derek Mound, M.Ed, RD, LDN, CDE 08/25/2017 10:13 AM

## 2017-08-28 ENCOUNTER — Encounter (HOSPITAL_COMMUNITY)
Admission: RE | Admit: 2017-08-28 | Discharge: 2017-08-28 | Disposition: A | Discharge status: Home / Self Care | Payer: BLUE CROSS/BLUE SHIELD | Source: Ambulatory Visit | Attending: Cardiology | Admitting: Cardiology

## 2017-08-28 ENCOUNTER — Encounter (HOSPITAL_COMMUNITY): Payer: BLUE CROSS/BLUE SHIELD

## 2017-08-28 DIAGNOSIS — Z951 Presence of aortocoronary bypass graft: Secondary | ICD-10-CM

## 2017-08-30 ENCOUNTER — Encounter (HOSPITAL_COMMUNITY): Payer: BLUE CROSS/BLUE SHIELD

## 2017-08-30 ENCOUNTER — Encounter (HOSPITAL_COMMUNITY)
Admission: RE | Admit: 2017-08-30 | Discharge: 2017-08-30 | Disposition: A | Discharge status: Home / Self Care | Payer: BLUE CROSS/BLUE SHIELD | Source: Ambulatory Visit | Attending: Cardiology | Admitting: Cardiology

## 2017-08-30 DIAGNOSIS — Z951 Presence of aortocoronary bypass graft: Secondary | ICD-10-CM | POA: Diagnosis not present

## 2017-09-01 ENCOUNTER — Encounter (HOSPITAL_COMMUNITY): Payer: BLUE CROSS/BLUE SHIELD

## 2017-09-04 ENCOUNTER — Encounter (HOSPITAL_COMMUNITY): Payer: BLUE CROSS/BLUE SHIELD

## 2017-09-04 ENCOUNTER — Telehealth (HOSPITAL_COMMUNITY): Payer: Self-pay | Admitting: *Deleted

## 2017-09-06 ENCOUNTER — Encounter (HOSPITAL_COMMUNITY): Payer: BLUE CROSS/BLUE SHIELD

## 2017-09-06 ENCOUNTER — Encounter (HOSPITAL_COMMUNITY)
Admission: RE | Admit: 2017-09-06 | Discharge: 2017-09-06 | Disposition: A | Discharge status: Home / Self Care | Payer: BLUE CROSS/BLUE SHIELD | Source: Ambulatory Visit | Attending: Cardiology | Admitting: Cardiology

## 2017-09-06 DIAGNOSIS — Z951 Presence of aortocoronary bypass graft: Secondary | ICD-10-CM | POA: Diagnosis not present

## 2017-09-08 ENCOUNTER — Encounter (HOSPITAL_COMMUNITY): Payer: BLUE CROSS/BLUE SHIELD

## 2017-09-08 ENCOUNTER — Encounter (HOSPITAL_COMMUNITY)
Admission: RE | Admit: 2017-09-08 | Discharge: 2017-09-08 | Disposition: A | Discharge status: Home / Self Care | Payer: BLUE CROSS/BLUE SHIELD | Source: Ambulatory Visit | Attending: Cardiology | Admitting: Cardiology

## 2017-09-08 DIAGNOSIS — Z951 Presence of aortocoronary bypass graft: Secondary | ICD-10-CM | POA: Diagnosis not present

## 2017-09-11 ENCOUNTER — Encounter (HOSPITAL_COMMUNITY)
Admission: RE | Admit: 2017-09-11 | Discharge: 2017-09-11 | Disposition: A | Discharge status: Home / Self Care | Payer: BLUE CROSS/BLUE SHIELD | Source: Ambulatory Visit | Attending: Cardiology | Admitting: Cardiology

## 2017-09-11 ENCOUNTER — Encounter (HOSPITAL_COMMUNITY): Payer: BLUE CROSS/BLUE SHIELD

## 2017-09-11 DIAGNOSIS — Z951 Presence of aortocoronary bypass graft: Secondary | ICD-10-CM

## 2017-09-13 ENCOUNTER — Encounter (HOSPITAL_COMMUNITY): Payer: BLUE CROSS/BLUE SHIELD

## 2017-09-13 ENCOUNTER — Encounter (HOSPITAL_COMMUNITY): Admission: RE | Admit: 2017-09-13 | Payer: BLUE CROSS/BLUE SHIELD | Source: Ambulatory Visit

## 2017-09-15 ENCOUNTER — Encounter (HOSPITAL_COMMUNITY)
Admission: RE | Admit: 2017-09-15 | Discharge: 2017-09-15 | Disposition: A | Discharge status: Home / Self Care | Payer: BLUE CROSS/BLUE SHIELD | Source: Ambulatory Visit | Attending: Cardiology | Admitting: Cardiology

## 2017-09-15 ENCOUNTER — Encounter (HOSPITAL_COMMUNITY): Payer: BLUE CROSS/BLUE SHIELD

## 2017-09-15 DIAGNOSIS — Z951 Presence of aortocoronary bypass graft: Secondary | ICD-10-CM | POA: Insufficient documentation

## 2017-09-15 NOTE — Progress Notes (Signed)
Cardiac Individual Treatment Plan  Patient Details  Name: Dominic Ray MRN: [id number] Date of Birth: December 02, 1955 Referring Provider:     CARDIAC REHAB PHASE II ORIENTATION from 07/06/2017 in [place]  Referring Provider  Kirk Ruths MD      Initial Encounter Date:    CARDIAC REHAB PHASE II ORIENTATION from 07/06/2017 in [place]  Date  07/06/17  Referring Provider  Kirk Ruths MD      Visit Diagnosis: 05/09/17 S/P CABG x 4  Patient's Home Medications on Admission:  Current Outpatient Prescriptions:  .  aspirin EC 325 MG EC tablet, Take 1 tablet (325 mg total) by mouth daily., Disp: 30 tablet, Rfl: 0 .  atorvastatin (LIPITOR) 80 MG tablet, Take 1 tablet (80 mg total) by mouth daily at 6 PM., Disp: 30 tablet, Rfl: 12 .  metoprolol tartrate (LOPRESSOR) 25 MG tablet, Take 1 tablet (25 mg total) by mouth 2 (two) times daily., Disp: 60 tablet, Rfl: 12  Past Medical History: Past Medical History:  Diagnosis Date  . Basal cell carcinoma of nasal tip 08/24/2012  . Coronary artery disease    a. Abnl stress test -> s/p CABGx4 in 04/2017 with LIMA-LAD, SVG-diag, SVG-AM, SVG-PDA.  [place] Kitchen Hyperlipidemia   . Hypertension   . Male hypogonadism 08/24/2012  . Onychomycosis 08/24/2012    Tobacco Use: History  Smoking Status  . Never Smoker  Smokeless Tobacco  . Never Used    Labs: Recent Review Flowsheet Data    Labs for ITP Cardiac and Pulmonary Rehab Latest Ref Rng & Units 05/09/2017 05/09/2017 05/09/2017 05/10/2017 06/22/2017   Cholestrol 100 - 199 mg/dL - - - - 68(L)   LDLCALC 0 - 99 mg/dL - - - - 26   HDL >39 mg/dL - - - - 31(L)   Trlycerides 0 - 149 mg/dL - - - - 55   Hemoglobin A1c 4.8 - 5.6 % - - - - -   PHART 7.350 - 7.450 7.332(L) 7.356 - - -   PCO2ART 32.0 - 48.0 mmHg 47.5 43.1 - - -   HCO3 20.0 - 28.0 mmol/L 25.0 24.0 - - -   TCO2 0 - 100 mmol/L 26 25 25 28  -   ACIDBASEDEF 0.0 - 2.0 mmol/L 1.0 1.0 - - -    O2SAT % 98.0 93.0 - - -      Capillary Blood Glucose: Lab Results  Component Value Date   GLUCAP 97 05/11/2017   GLUCAP 112 (H) 05/11/2017   GLUCAP 110 (H) 05/11/2017   GLUCAP 131 (H) 05/10/2017   GLUCAP 157 (H) 05/10/2017     Exercise Target Goals:    Exercise Program Goal: Individual exercise prescription set with THRR, safety & activity barriers. Participant demonstrates ability to understand and report RPE using BORG scale, to self-measure pulse accurately, and to acknowledge the importance of the exercise prescription.  Exercise Prescription Goal: Starting with aerobic activity 30 plus minutes a day, 3 days per week for initial exercise prescription. Provide home exercise prescription and guidelines that participant acknowledges understanding prior to discharge.  Activity Barriers & Risk Stratification:     Activity Barriers & Cardiac Risk Stratification - 07/06/17 0838      Activity Barriers & Cardiac Risk Stratification   Activity Barriers Arthritis;Back Problems   Cardiac Risk Stratification High      6 Minute Walk:     6 Minute Walk    Row Name 07/06/17 1000  6 Minute Walk   Phase Initial     Distance 18888 feet     Walk Time 6 minutes     # of Rest Breaks 0     MPH 3.68     METS 4.19     RPE 9     VO2 Peak 14.68     Symptoms No     Resting HR 57 bpm     Resting BP 138/70     Max Ex. HR 93 bpm     Max Ex. BP 134/82     2 Minute Post BP 124/70        Oxygen Initial Assessment:   Oxygen Re-Evaluation:   Oxygen Discharge (Final Oxygen Re-Evaluation):   Initial Exercise Prescription:     Initial Exercise Prescription - 07/06/17 1000      Date of Initial Exercise RX and Referring Provider   Date 07/06/17   Referring Provider Kirk Ruths MD     Treadmill   MPH 3.1   Grade 1   Minutes 10   METs 3.8     Bike   Level 1   Minutes 10   METs 2.99     NuStep   Level 3   SPM 80   Minutes 10   METs 2.5     Arm  Ergometer   Level 2   Minutes 10   METs 2.5     Prescription Details   Frequency (times per week) 3   Duration Progress to 30 minutes of continuous aerobic without signs/symptoms of physical distress     Intensity   THRR 40-80% of Max Heartrate 64-128   Ratings of Perceived Exertion 11-13   Perceived Dyspnea 0-4     Progression   Progression Continue progressive overload as per policy without signs/symptoms or physical distress.     Resistance Training   Training Prescription Yes   Weight 4lbs   Reps 10-15      Perform Capillary Blood Glucose checks as needed.  Exercise Prescription Changes:     Exercise Prescription Changes    Row Name 07/10/17 1700 07/25/17 1000 08/07/17 1700 08/21/17 1500 09/06/17 0953     Response to Exercise   Blood Pressure (Admit) 122/62 104/72 114/64 102/64 106/64   Blood Pressure (Exercise) 170/84 126/80 144/74 120/72 128/64   Blood Pressure (Exit) 108/60 110/60 104/60 104/64 102/64   Heart Rate (Admit) 58 bpm 55 bpm 86 bpm 68 bpm 55 bpm   Heart Rate (Exercise) 92 bpm 106 bpm 134 bpm 111 bpm 112 bpm   Heart Rate (Exit) 58 bpm 56 bpm 82 bpm 65 bpm 56 bpm   Rating of Perceived Exertion (Exercise) 12 11 12 12 10    Symptoms  - none none none none   Duration Progress to 30 minutes of  aerobic without signs/symptoms of physical distress Progress to 45 minutes of aerobic exercise without signs/symptoms of physical distress Progress to 45 minutes of aerobic exercise without signs/symptoms of physical distress Progress to 45 minutes of aerobic exercise without signs/symptoms of physical distress Progress to 45 minutes of aerobic exercise without signs/symptoms of physical distress   Intensity THRR unchanged THRR unchanged THRR unchanged THRR unchanged THRR unchanged     Progression   Progression Continue to progress workloads to maintain intensity without signs/symptoms of physical distress. Continue to progress workloads to maintain intensity without  signs/symptoms of physical distress. Continue to progress workloads to maintain intensity without signs/symptoms of physical distress. Continue to progress workloads to maintain  intensity without signs/symptoms of physical distress. Continue to progress workloads to maintain intensity without signs/symptoms of physical distress.   Average METs 3.1 4.7 4.5 4.7 4.8     Resistance Training   Training Prescription Yes Yes Yes Yes Yes   Weight 2lbs 4lbs 4lbs 4lbs 4lbs   Reps 10-15 10-15 10-15 10-15 10-15   Time  -  - 10 Minutes 10 Minutes 10 Minutes     Interval Training   Interval Training No No No No No     Treadmill   MPH 3.1 3.6 3.6 3.6 3.6   Grade 1 3 3 3 3    Minutes 10 10 10 10 10    METs 3.8 4.5 5.25 5.25 5.25     Bike   Level 1 2 2 2 2    Minutes 10 10 10 10 10    METs 3.02 5.1 5.15 5.16 5.1     NuStep   Level - 5 5 6 6    SPM - 90 90 90 90   Minutes - 10 10 10 10    METs - 4.5 3.2 3.7 4.1     Arm Ergometer   Level 2  -  -  -  -   Minutes 10  -  -  -  -   METs 2.4  -  -  -  -     Home Exercise Plan   Plans to continue exercise at  - Home (comment) Home (comment) Home (comment) Home (comment)   Frequency  - Add 2 additional days to program exercise sessions. Add 2 additional days to program exercise sessions. Add 2 additional days to program exercise sessions. Add 2 additional days to program exercise sessions.   Initial Home Exercises Provided  - 07/21/17 07/21/17 07/21/17 07/21/17      Exercise Comments:     Exercise Comments    Row Name 07/21/17 1110 07/26/17 1110 08/09/17 1123 09/06/17 0945     Exercise Comments Reviewed home exercise guidelines. Pt is walking and has access to a gym where he plans to use the pool and exercise equipment for his home exercise. Reviewed METs and goals with patient. Reviewed METs with patient. Reviewed METs and goals with patient.       Exercise Goals and Review:     Exercise Goals    Row Name 07/06/17 0839 07/06/17 1019            Exercise Goals   Increase Physical Activity Yes  -      Intervention Provide advice, education, support and counseling about physical activity/exercise needs.;Develop an individualized exercise prescription for aerobic and resistive training based on initial evaluation findings, risk stratification, comorbidities and participant's personal goals.  -      Expected Outcomes Achievement of increased cardiorespiratory fitness and enhanced flexibility, muscular endurance and strength shown through measurements of functional capacity and personal statement of participant.  -      Increase Strength and Stamina Yes  increase muscle tone and get stronger Yes  return to jogging, increase muscle tone      Intervention Provide advice, education, support and counseling about physical activity/exercise needs.;Develop an individualized exercise prescription for aerobic and resistive training based on initial evaluation findings, risk stratification, comorbidities and participant's personal goals.  -      Expected Outcomes Achievement of increased cardiorespiratory fitness and enhanced flexibility, muscular endurance and strength shown through measurements of functional capacity and personal statement of participant.  -  Exercise Goals Re-Evaluation :     Exercise Goals Re-Evaluation    Row Name 07/26/17 1110 09/06/17 0945           Exercise Goal Re-Evaluation   Exercise Goals Review Increase Physical Activity;Increase Strength and Stamina;Knowledge and understanding of Target Heart Rate Range (THRR);Able to understand and use rate of perceived exertion (RPE) scale Understanding of Exercise Prescription      Comments Pt states he's "slowly but surely: getting stronger. Pt has returned to some exercise at a gym in addition to CR. Patient states that he's gradually building a routine with his home exercise. Exercising 2 days/week in addition to CR>      Expected Outcomes Increase workloads as  tolerated. Return to exercise routine at fitness center. Continue exercise at least 5 days/week to achieve health and fitness goals.          Discharge Exercise Prescription (Final Exercise Prescription Changes):     Exercise Prescription Changes - 09/06/17 0953      Response to Exercise   Blood Pressure (Admit) 106/64   Blood Pressure (Exercise) 128/64   Blood Pressure (Exit) 102/64   Heart Rate (Admit) 55 bpm   Heart Rate (Exercise) 112 bpm   Heart Rate (Exit) 56 bpm   Rating of Perceived Exertion (Exercise) 10   Symptoms none   Duration Progress to 45 minutes of aerobic exercise without signs/symptoms of physical distress   Intensity THRR unchanged     Progression   Progression Continue to progress workloads to maintain intensity without signs/symptoms of physical distress.   Average METs 4.8     Resistance Training   Training Prescription Yes   Weight 4lbs   Reps 10-15   Time 10 Minutes     Interval Training   Interval Training No     Treadmill   MPH 3.6   Grade 3   Minutes 10   METs 5.25     Bike   Level 2   Minutes 10   METs 5.1     NuStep   Level 6   SPM 90   Minutes 10   METs 4.1     Home Exercise Plan   Plans to continue exercise at Home (comment)   Frequency Add 2 additional days to program exercise sessions.   Initial Home Exercises Provided 07/21/17      Nutrition:  Target Goals: Understanding of nutrition guidelines, daily intake of sodium 1500mg , cholesterol 200mg , calories 30% from fat and 7% or less from saturated fats, daily to have 5 or more servings of fruits and vegetables.  Biometrics:     Pre Biometrics - 07/06/17 1017      Pre Biometrics   Waist Circumference 39.5 inches   Hip Circumference 41 inches   Waist to Hip Ratio 0.96 %   Triceps Skinfold 19 mm   % Body Fat 29.5 %   Grip Strength 33 kg   Flexibility 14 in   Single Leg Stand 30 seconds       Nutrition Therapy Plan and Nutrition Goals:     Nutrition  Therapy & Goals - 07/06/17 1224      Nutrition Therapy   Diet Therapeutic Lifestyle Changes     Personal Nutrition Goals   Nutrition Goal Pt to identify food quantities necessary to achieve weight loss of 6-12 lb at graduation from cardiac rehab. Goal wt of 188 lb desired.      Intervention Plan   Intervention Prescribe, educate and counsel regarding individualized  specific dietary modifications aiming towards targeted core components such as weight, hypertension, lipid management, diabetes, heart failure and other comorbidities.   Expected Outcomes Short Term Goal: Understand basic principles of dietary content, such as calories, fat, sodium, cholesterol and nutrients.;Long Term Goal: Adherence to prescribed nutrition plan.      Nutrition Discharge: Nutrition Scores:     Nutrition Assessments - 07/06/17 1224      MEDFICTS Scores   Pre Score 6      Nutrition Goals Re-Evaluation:   Nutrition Goals Re-Evaluation:   Nutrition Goals Discharge (Final Nutrition Goals Re-Evaluation):   Psychosocial: Target Goals: Acknowledge presence or absence of significant depression and/or stress, maximize coping skills, provide positive support system. Participant is able to verbalize types and ability to use techniques and skills needed for reducing stress and depression.  Initial Review & Psychosocial Screening:     Initial Psych Review & Screening - 07/06/17 1244      Initial Review   Current issues with Current Stress Concerns   Source of Stress Concerns Occupation;Financial   Comments Pt is not working-unemployed per vocational rehab intake form     [place]? Yes     Barriers   Psychosocial barriers to participate in program The patient should benefit from training in stress management and relaxation.     Screening Interventions   Interventions Encouraged to exercise      Quality of Life Scores:     Quality of Life - 07/06/17 1018       Quality of Life Scores   Health/Function Pre 23.3 %   Socioeconomic Pre 24.86 %   Psych/Spiritual Pre 23.29 %   Family Pre 25.5 %   GLOBAL Pre 23.89 %      PHQ-9: Recent Review Flowsheet Data    Depression screen [facility] 2/9 07/10/2017   Decreased Interest 0   Down, Depressed, Hopeless 0   PHQ - 2 Score 0     Interpretation of Total Score  Total Score Depression Severity:  1-4 = Minimal depression, 5-9 = Mild depression, 10-14 = Moderate depression, 15-19 = Moderately severe depression, 20-27 = Severe depression   Psychosocial Evaluation and Intervention:   Psychosocial Re-Evaluation:     Psychosocial Re-Evaluation    Row Name 07/27/17 1011 08/23/17 1702 09/15/17 1632         Psychosocial Re-Evaluation   Current issues with None Identified None Identified None Identified     Interventions Encouraged to attend Cardiac Rehabilitation for the exercise Encouraged to attend Cardiac Rehabilitation for the exercise Encouraged to attend Cardiac Rehabilitation for the exercise     Continue Psychosocial Services  No Follow up required No Follow up required No Follow up required        Psychosocial Discharge (Final Psychosocial Re-Evaluation):     Psychosocial Re-Evaluation - 09/15/17 1632      Psychosocial Re-Evaluation   Current issues with None Identified   Interventions Encouraged to attend Cardiac Rehabilitation for the exercise   Continue Psychosocial Services  No Follow up required      Vocational Rehabilitation: Provide vocational rehab assistance to qualifying candidates.   Vocational Rehab Evaluation & Intervention:     Vocational Rehab - 07/13/17 1507      Initial Vocational Rehab Evaluation & Intervention   Assessment shows need for Vocational Rehabilitation No     Vocational Rehab Re-Evaulation   Comments --  Mr Mandt says he does not need vocational rehab at this time and changed his  mind after reviewing the packet.      Education: Education Goals:  Education classes will be provided on a weekly basis, covering required topics. Participant will state understanding/return demonstration of topics presented.  Learning Barriers/Preferences:     Learning Barriers/Preferences - 07/06/17 5397      Learning Barriers/Preferences   Learning Barriers Sight   Learning Preferences Written Material;Skilled Demonstration      Education Topics: Count Your Pulse:  -Group instruction provided by verbal instruction, demonstration, patient participation and written materials to support subject.  Instructors address importance of being able to find your pulse and how to count your pulse when at home without a heart monitor.  Patients get hands on experience counting their pulse with staff help and individually.   CARDIAC REHAB PHASE II EXERCISE from 09/15/2017 in [place]  Date  09/15/17  Instruction Review Code  2- meets goals/outcomes      Heart Attack, Angina, and Risk Factor Modification:  -Group instruction provided by verbal instruction, video, and written materials to support subject.  Instructors address signs and symptoms of angina and heart attacks.    Also discuss risk factors for heart disease and how to make changes to improve heart health risk factors.   CARDIAC REHAB PHASE II EXERCISE from 09/15/2017 in [place]  Date  08/02/17  Instruction Review Code  2- meets goals/outcomes      Functional Fitness:  -Group instruction provided by verbal instruction, demonstration, patient participation, and written materials to support subject.  Instructors address safety measures for doing things around the house.  Discuss how to get up and down off the floor, how to pick things up properly, how to safely get out of a chair without assistance, and balance training.   Meditation and Mindfulness:  -Group instruction provided by verbal instruction, patient participation, and written  materials to support subject.  Instructor addresses importance of mindfulness and meditation practice to help reduce stress and improve awareness.  Instructor also leads participants through a meditation exercise.    Stretching for Flexibility and Mobility:  -Group instruction provided by verbal instruction, patient participation, and written materials to support subject.  Instructors lead participants through series of stretches that are designed to increase flexibility thus improving mobility.  These stretches are additional exercise for major muscle groups that are typically performed during regular warm up and cool down.   CARDIAC REHAB PHASE II EXERCISE from 09/15/2017 in [place]  Date  08/18/17  Instruction Review Code  2- meets goals/outcomes      Hands Only CPR:  -Group verbal, video, and participation provides a basic overview of AHA guidelines for community CPR. Role-play of emergencies allow participants the opportunity to practice calling for help and chest compression technique with discussion of AED use.   Hypertension: -Group verbal and written instruction that provides a basic overview of hypertension including the most recent diagnostic guidelines, risk factor reduction with self-care instructions and medication management.   CARDIAC REHAB PHASE II EXERCISE from 09/15/2017 in [place]  Date  08/11/17  Instruction Review Code  2- meets goals/outcomes       Nutrition I class: Heart Healthy Eating:  -Group instruction provided by PowerPoint slides, verbal discussion, and written materials to support subject matter. The instructor gives an explanation and review of the Therapeutic Lifestyle Changes diet recommendations, which includes a discussion on lipid goals, dietary fat, sodium, fiber, plant stanol/sterol  esters, sugar, and the components of a well-balanced, healthy diet.   CARDIAC REHAB PHASE II EXERCISE  from 09/15/2017 in [place]  Date  08/15/17  Educator  RD  Instruction Review Code  2- meets goals/outcomes      Nutrition II class: Lifestyle Skills:  -Group instruction provided by PowerPoint slides, verbal discussion, and written materials to support subject matter. The instructor gives an explanation and review of label reading, grocery shopping for heart health, heart healthy recipe modifications, and ways to make healthier choices when eating out.   CARDIAC REHAB PHASE II EXERCISE from 09/15/2017 in [place]  Date  07/25/17  Educator  RD  Instruction Review Code  2- meets goals/outcomes      Diabetes Question & Answer:  -Group instruction provided by PowerPoint slides, verbal discussion, and written materials to support subject matter. The instructor gives an explanation and review of diabetes co-morbidities, pre- and post-prandial blood glucose goals, pre-exercise blood glucose goals, signs, symptoms, and treatment of hypoglycemia and hyperglycemia, and foot care basics.   Diabetes Blitz:  -Group instruction provided by PowerPoint slides, verbal discussion, and written materials to support subject matter. The instructor gives an explanation and review of the physiology behind type 1 and type 2 diabetes, diabetes medications and rational behind using different medications, pre- and post-prandial blood glucose recommendations and Hemoglobin A1c goals, diabetes diet, and exercise including blood glucose guidelines for exercising safely.    Portion Distortion:  -Group instruction provided by PowerPoint slides, verbal discussion, written materials, and food models to support subject matter. The instructor gives an explanation of serving size versus portion size, changes in portions sizes over the last 20 years, and what consists of a serving from each food group.   Stress Management:  -Group instruction provided by  verbal instruction, video, and written materials to support subject matter.  Instructors review role of stress in heart disease and how to cope with stress positively.     Exercising on Your Own:  -Group instruction provided by verbal instruction, power point, and written materials to support subject.  Instructors discuss benefits of exercise, components of exercise, frequency and intensity of exercise, and end points for exercise.  Also discuss use of nitroglycerin and activating EMS.  Review options of places to exercise outside of rehab.  Review guidelines for sex with heart disease.   CARDIAC REHAB PHASE II EXERCISE from 09/15/2017 in [place]  Date  07/19/17  Instruction Review Code  2- meets goals/outcomes      Cardiac Drugs I:  -Group instruction provided by verbal instruction and written materials to support subject.  Instructor reviews cardiac drug classes: antiplatelets, anticoagulants, beta blockers, and statins.  Instructor discusses reasons, side effects, and lifestyle considerations for each drug class.   Cardiac Drugs II:  -Group instruction provided by verbal instruction and written materials to support subject.  Instructor reviews cardiac drug classes: angiotensin converting enzyme inhibitors (ACE-I), angiotensin II receptor blockers (ARBs), nitrates, and calcium channel blockers.  Instructor discusses reasons, side effects, and lifestyle considerations for each drug class.   CARDIAC REHAB PHASE II EXERCISE from 09/15/2017 in [place]  Date  08/23/17  Instruction Review Code  2- meets goals/outcomes      Anatomy and Physiology of the Circulatory System:  Group verbal and written instruction and models provide basic cardiac anatomy and physiology, with the coronary electrical and arterial systems. Review of: AMI,  Angina, Valve disease, Heart Failure, Peripheral Artery Disease, Cardiac Arrhythmia, Pacemakers, and  the ICD.   CARDIAC REHAB PHASE II EXERCISE from 09/15/2017 in [place]  Date  08/09/17  Instruction Review Code  2- meets goals/outcomes      Other Education:  -Group or individual verbal, written, or video instructions that support the educational goals of the cardiac rehab program.   Knowledge Questionnaire Score:     Knowledge Questionnaire Score - 07/06/17 1000      Knowledge Questionnaire Score   Pre Score 25/28      Core Components/Risk Factors/Patient Goals at Admission:     Personal Goals and Risk Factors at Admission - 07/06/17 1017      Core Components/Risk Factors/Patient Goals on Admission    Weight Management Yes;Obesity;Weight Loss;Weight Maintenance   Intervention Weight Management: Develop a combined nutrition and exercise program designed to reach desired caloric intake, while maintaining appropriate intake of nutrient and fiber, sodium and fats, and appropriate energy expenditure required for the weight goal.;Weight Management: Provide education and appropriate resources to help participant work on and attain dietary goals.;Weight Management/Obesity: Establish reasonable short term and long term weight goals.;Obesity: Provide education and appropriate resources to help participant work on and attain dietary goals.   Expected Outcomes Short Term: Continue to assess and modify interventions until short term weight is achieved;Long Term: Adherence to nutrition and physical activity/exercise program aimed toward attainment of established weight goal;Weight Maintenance: Understanding of the daily nutrition guidelines, which includes 25-35% calories from fat, 7% or less cal from saturated fats, less than 200mg  cholesterol, less than 1.5gm of sodium, & 5 or more servings of fruits and vegetables daily;Understanding recommendations for meals to include 15-35% energy as protein, 25-35% energy from fat, 35-60% energy from carbohydrates, less than  200mg  of dietary cholesterol, 20-35 gm of total fiber daily;Weight Loss: Understanding of general recommendations for a balanced deficit meal plan, which promotes 1-2 lb weight loss per week and includes a negative energy balance of [phone] kcal/d;Understanding of distribution of calorie intake throughout the day with the consumption of 4-5 meals/snacks   Hypertension Yes   Intervention Provide education on lifestyle modifcations including regular physical activity/exercise, weight management, moderate sodium restriction and increased consumption of fresh fruit, vegetables, and low fat dairy, alcohol moderation, and smoking cessation.;Monitor prescription use compliance.   Expected Outcomes Short Term: Continued assessment and intervention until BP is < 140/28mm HG in hypertensive participants. < 130/33mm HG in hypertensive participants with diabetes, heart failure or chronic kidney disease.;Long Term: Maintenance of blood pressure at goal levels.   Lipids Yes   Intervention Provide education and support for participant on nutrition & aerobic/resistive exercise along with prescribed medications to achieve LDL 70mg , HDL >40mg .   Expected Outcomes Short Term: Participant states understanding of desired cholesterol values and is compliant with medications prescribed. Participant is following exercise prescription and nutrition guidelines.;Long Term: Cholesterol controlled with medications as prescribed, with individualized exercise RX and with personalized nutrition plan. Value goals: LDL < 70mg , HDL > 40 mg.   Stress Yes   Intervention Offer individual and/or small group education and counseling on adjustment to heart disease, stress management and health-related lifestyle change. Teach and support self-help strategies.;Refer participants experiencing significant psychosocial distress to appropriate mental health specialists for further evaluation and treatment. When possible, include family members and  significant others in education/counseling sessions.   Expected Outcomes Short Term: Participant demonstrates changes in health-related behavior, relaxation and other stress management skills, ability  to obtain effective social support, and compliance with psychotropic medications if prescribed.;Long Term: Emotional wellbeing is indicated by absence of clinically significant psychosocial distress or social isolation.      Core Components/Risk Factors/Patient Goals Review:      Goals and Risk Factor Review    Row Name 07/27/17 1009 08/23/17 1700 09/15/17 1632         Core Components/Risk Factors/Patient Goals Review   Personal Goals Review Weight Management/Obesity;Lipids;Hypertension;Stress Weight Management/Obesity;Lipids;Hypertension;Stress Weight Management/Obesity;Lipids;Hypertension;Stress     Review -  Kyler has been doing well with exercise at cardiac rehab, Aashrith's vital sign have been stable Jamar continues to do well with exercise. Fordyce's vital signs have been stable  Nuno continues to do well with exercise. Lemmie's vital signs have been stable      Expected Outcomes Masao will continue to exercise at cardiac rehab and take his medications as presribed Jeriah will continue to exercise at cardiac rehab and take his medications as presribed Kayveon will continue to exercise at cardiac rehab and take his medications as presribed        Core Components/Risk Factors/Patient Goals at Discharge (Final Review):      Goals and Risk Factor Review - 09/15/17 1632      Core Components/Risk Factors/Patient Goals Review   Personal Goals Review Weight Management/Obesity;Lipids;Hypertension;Stress   Review Josimar continues to do well with exercise. Jamier's vital signs have been stable    Expected Outcomes Aidian will continue to exercise at cardiac rehab and take his medications as presribed      ITP Comments:     ITP Comments    Row Name 07/06/17 0815           ITP Comments Dr. Fransico Him,  Medical Director          Comments: Alphonso is making expected progress toward personal goals after completing 27sessions. Recommend continued exercise and life style modification education including  stress management and relaxation techniques to decrease cardiac risk profile. Marquies continues to do well with exercise and recently competed in a softball championship where he was the MVP.Barnet Pall, RN,BSN 09/15/2017 4:35 PM

## 2017-09-18 ENCOUNTER — Encounter (HOSPITAL_COMMUNITY)
Admission: RE | Admit: 2017-09-18 | Discharge: 2017-09-18 | Disposition: A | Discharge status: Home / Self Care | Payer: BLUE CROSS/BLUE SHIELD | Source: Ambulatory Visit | Attending: Cardiology | Admitting: Cardiology

## 2017-09-18 ENCOUNTER — Telehealth (HOSPITAL_COMMUNITY): Payer: Self-pay | Admitting: *Deleted

## 2017-09-18 ENCOUNTER — Encounter (HOSPITAL_COMMUNITY): Payer: BLUE CROSS/BLUE SHIELD

## 2017-09-18 DIAGNOSIS — Z951 Presence of aortocoronary bypass graft: Secondary | ICD-10-CM

## 2017-09-18 NOTE — Telephone Encounter (Signed)
-----   Message from Lelon Perla, MD sent at 09/15/2017  6:21 PM EDT ----- Regarding: RE: Clearance to do weights [place] for resistance training ----- Message ----- From: Sol Passer Sent: 09/15/2017  11:35 AM To: Lelon Perla, MD Subject: Clearance to do weights                        Greetings [name],  Your patient Dominic Ray has been in the cardiac rehabilitation program approximately 10 weeks and is doing well. Patient would like to begin resistance training. Please indicate if you are agreeable to this change in exercise prescription.  We appreciate your assistance and referral of your patient to our program. Sol Passer, MS, ACSM CEP

## 2017-09-20 ENCOUNTER — Encounter (HOSPITAL_COMMUNITY): Payer: BLUE CROSS/BLUE SHIELD

## 2017-09-20 ENCOUNTER — Encounter (HOSPITAL_COMMUNITY)
Admission: RE | Admit: 2017-09-20 | Discharge: 2017-09-20 | Disposition: A | Discharge status: Home / Self Care | Payer: BLUE CROSS/BLUE SHIELD | Source: Ambulatory Visit | Attending: Cardiology | Admitting: Cardiology

## 2017-09-20 DIAGNOSIS — Z951 Presence of aortocoronary bypass graft: Secondary | ICD-10-CM | POA: Diagnosis not present

## 2017-09-22 ENCOUNTER — Encounter (HOSPITAL_COMMUNITY): Payer: BLUE CROSS/BLUE SHIELD

## 2017-09-22 ENCOUNTER — Encounter (HOSPITAL_COMMUNITY)
Admission: RE | Admit: 2017-09-22 | Discharge: 2017-09-22 | Disposition: A | Discharge status: Home / Self Care | Payer: BLUE CROSS/BLUE SHIELD | Source: Ambulatory Visit | Attending: Cardiology | Admitting: Cardiology

## 2017-09-22 DIAGNOSIS — Z951 Presence of aortocoronary bypass graft: Secondary | ICD-10-CM

## 2017-09-25 ENCOUNTER — Encounter (HOSPITAL_COMMUNITY): Payer: BLUE CROSS/BLUE SHIELD

## 2017-09-25 ENCOUNTER — Telehealth (HOSPITAL_COMMUNITY): Payer: Self-pay | Admitting: *Deleted

## 2017-09-27 ENCOUNTER — Telehealth (HOSPITAL_COMMUNITY): Payer: Self-pay | Admitting: *Deleted

## 2017-09-27 ENCOUNTER — Encounter (HOSPITAL_COMMUNITY): Payer: BLUE CROSS/BLUE SHIELD

## 2017-09-27 ENCOUNTER — Encounter (HOSPITAL_COMMUNITY)
Admission: RE | Admit: 2017-09-27 | Discharge: 2017-09-27 | Disposition: A | Discharge status: Home / Self Care | Payer: BLUE CROSS/BLUE SHIELD | Source: Ambulatory Visit | Attending: Cardiology | Admitting: Cardiology

## 2017-09-27 DIAGNOSIS — Z951 Presence of aortocoronary bypass graft: Secondary | ICD-10-CM | POA: Diagnosis not present

## 2017-09-27 NOTE — Telephone Encounter (Signed)
-----   Message from Lelon Perla, MD sent at 09/27/2017 12:03 PM EST ----- Regarding: RE: Clearance to start jogging Ok to jog ----- Message ----- From: Sol Passer Sent: 09/27/2017  11:33 AM To: Lelon Perla, MD Subject: Clearance to start jogging                     Greetings [name],  Your patient Dominic Ray has been in the cardiac rehabilitation program approximately 12 weeks and is doing well. Patient would like to begin jogging. Please indicate if you are agreeable to this change in exercise prescription.  We appreciate your assistance and referral of your patient to our program.  Sol Passer, MS, ACSM CEP

## 2017-09-29 ENCOUNTER — Encounter (HOSPITAL_COMMUNITY)
Admission: RE | Admit: 2017-09-29 | Discharge: 2017-09-29 | Disposition: A | Discharge status: Home / Self Care | Payer: BLUE CROSS/BLUE SHIELD | Source: Ambulatory Visit | Attending: Cardiology | Admitting: Cardiology

## 2017-09-29 ENCOUNTER — Encounter (HOSPITAL_COMMUNITY): Payer: BLUE CROSS/BLUE SHIELD

## 2017-09-29 DIAGNOSIS — Z951 Presence of aortocoronary bypass graft: Secondary | ICD-10-CM | POA: Diagnosis not present

## 2017-10-02 ENCOUNTER — Encounter (HOSPITAL_COMMUNITY)
Admission: RE | Admit: 2017-10-02 | Discharge: 2017-10-02 | Disposition: A | Discharge status: Home / Self Care | Payer: BLUE CROSS/BLUE SHIELD | Source: Ambulatory Visit | Attending: Cardiology | Admitting: Cardiology

## 2017-10-02 ENCOUNTER — Encounter (HOSPITAL_COMMUNITY): Payer: BLUE CROSS/BLUE SHIELD

## 2017-10-02 DIAGNOSIS — Z951 Presence of aortocoronary bypass graft: Secondary | ICD-10-CM | POA: Diagnosis not present

## 2017-10-04 ENCOUNTER — Encounter (HOSPITAL_COMMUNITY): Payer: BLUE CROSS/BLUE SHIELD

## 2017-10-04 ENCOUNTER — Encounter (HOSPITAL_COMMUNITY)
Admission: RE | Admit: 2017-10-04 | Discharge: 2017-10-04 | Disposition: A | Discharge status: Home / Self Care | Payer: BLUE CROSS/BLUE SHIELD | Source: Ambulatory Visit | Attending: Cardiology | Admitting: Cardiology

## 2017-10-04 DIAGNOSIS — Z951 Presence of aortocoronary bypass graft: Secondary | ICD-10-CM

## 2017-10-06 ENCOUNTER — Encounter (HOSPITAL_COMMUNITY): Payer: BLUE CROSS/BLUE SHIELD

## 2017-10-09 ENCOUNTER — Encounter (HOSPITAL_COMMUNITY): Payer: BLUE CROSS/BLUE SHIELD

## 2017-10-09 ENCOUNTER — Encounter (HOSPITAL_COMMUNITY)
Admission: RE | Admit: 2017-10-09 | Discharge: 2017-10-09 | Disposition: A | Discharge status: Home / Self Care | Payer: BLUE CROSS/BLUE SHIELD | Source: Ambulatory Visit | Attending: Cardiology | Admitting: Cardiology

## 2017-10-09 DIAGNOSIS — Z951 Presence of aortocoronary bypass graft: Secondary | ICD-10-CM

## 2017-10-10 ENCOUNTER — Telehealth (HOSPITAL_COMMUNITY): Payer: Self-pay | Admitting: *Deleted

## 2017-10-10 NOTE — Telephone Encounter (Signed)
-----   Message from Lelon Perla, MD sent at 10/09/2017  4:56 PM EST ----- Regarding: RE: Clearance to participate in HIIT and increase target heart rate for exercise Ok to exercise as outlined  ----- Message ----- From: Sol Passer Sent: 10/09/2017  11:11 AM To: Lelon Perla, MD Subject: Clearance to participate in HIIT and increas#  Greetings [name],   Your patient Dominic Ray is interested in doing high intensity interval training (HIIT) in Cardiac Rehab.  He has been in program for 12 weeks and is doing great. He will complete the program on Wednesday 10/11/17 and plans to continue exercise at a  Local gym. He would like to include HIIT in his exercise prescription . HIs current THR is 64/128 (40-80% age predicted max) and we would like to increase it to 151 max (95 %) for HIIT.  Their RPE levels for the HIIT would reach up to 16-17 and active rest would be 11-13.  They would start at 2 min of active rest and 30 sec of high intensity and progress as tolerated. If you are agreeable to this change in exercise prescription please let us know.   Thanks so much for your help! Sol Passer, MS, ACSM CEP

## 2017-10-11 ENCOUNTER — Encounter (HOSPITAL_COMMUNITY)
Admission: RE | Admit: 2017-10-11 | Discharge: 2017-10-11 | Disposition: A | Discharge status: Home / Self Care | Payer: BLUE CROSS/BLUE SHIELD | Source: Ambulatory Visit | Attending: Cardiology | Admitting: Cardiology

## 2017-10-11 ENCOUNTER — Encounter (HOSPITAL_COMMUNITY): Payer: BLUE CROSS/BLUE SHIELD

## 2017-10-11 VITALS — BP 114/78 | HR 66 | Ht 67.75 in | Wt 201.9 lb

## 2017-10-11 DIAGNOSIS — Z951 Presence of aortocoronary bypass graft: Secondary | ICD-10-CM

## 2017-10-11 NOTE — Progress Notes (Signed)
Discharge Progress Report  Patient Details  Name: Dominic Ray MRN: [id number] Date of Birth: 1956/08/10 Referring Provider:     [facility] from 07/06/2017 in [place]  Referring Provider  Kirk Ruths MD       Number of Visits: 36  Reason for Discharge:  Patient independent in their exercise.  Smoking History:  Social History   Tobacco Use  Smoking Status Never Smoker  Smokeless Tobacco Never Used    Diagnosis:  05/09/17 S/P CABG x 4  ADL [facility]:   Initial Exercise Prescription: Initial Exercise Prescription - 07/06/17 1000      Date of Initial Exercise RX and Referring Provider   Date  07/06/17    Referring Provider  Kirk Ruths MD      Treadmill   MPH  3.1    Grade  1    Minutes  10    METs  3.8      Bike   Level  1    Minutes  10    METs  2.99      NuStep   Level  3    SPM  80    Minutes  10    METs  2.5      Arm Ergometer   Level  2    Minutes  10    METs  2.5      Prescription Details   Frequency (times per week)  3    Duration  Progress to 30 minutes of continuous aerobic without signs/symptoms of physical distress      Intensity   THRR 40-80% of Max Heartrate  64-128    Ratings of Perceived Exertion  11-13    Perceived Dyspnea  0-4      Progression   Progression  Continue progressive overload as per policy without signs/symptoms or physical distress.      Resistance Training   Training Prescription  Yes    Weight  4lbs    Reps  10-15       Discharge Exercise Prescription (Final Exercise Prescription Changes): Exercise Prescription Changes - 10/11/17 0955      Response to Exercise   Blood Pressure (Admit)  114/78    Blood Pressure (Exercise)  138/68    Blood Pressure (Exit)  98/68    Heart Rate (Admit)  66 bpm    Heart Rate (Exercise)  146 bpm    Heart Rate (Exit)  67 bpm    Rating of Perceived Exertion (Exercise)  13    Symptoms  none    Duration  Continue with  45 min of aerobic exercise without signs/symptoms of physical distress.    Intensity  THRR New 64-151 bpm      Progression   Progression  Continue to progress workloads to maintain intensity without signs/symptoms of physical distress.    Average METs  6.3      Resistance Training   Training Prescription  Yes    Weight  5lbs    Reps  10-15    Time  10 Minutes      Interval Training   Interval Training  Yes    Equipment  Treadmill    Comments  3.7/3 and 4.3/3      Treadmill   MPH  4.3 3.7/3.0     Grade  3    Minutes  -- 2.5 minuts HIIT    METs  8.47      NuStep   Level  8    SPM  90    Minutes  10    METs  5.1      Home Exercise Plan   Plans to continue exercise at  [facility] (comment)    Frequency  Add 2 additional days to program exercise sessions.    Initial Home Exercises Provided  07/21/17       Functional Capacity: 6 Minute Walk    Row Name 07/06/17 1000 09/27/17 1029       6 Minute Walk   Phase  Initial  Discharge    Distance  18888 feet  2236 feet Initial: 1888 ft    Distance % Change  -  18.43 %    Distance Feet Change  -  348 ft    Walk Time  6 minutes  6 minutes    # of Rest Breaks  0  0    MPH  3.68  4.23    METS  4.19  5.04    RPE  9  12    VO2 Peak  14.68  17.65    Symptoms  No  No    Resting HR  57 bpm  60 bpm    Resting BP  138/70  102/70    Max Ex. HR  93 bpm  107 bpm    Max Ex. BP  134/82  152/74    2 Minute Post BP  124/70  -       Psychological, QOL, Others - Outcomes: PHQ 2/9: Depression screen [facility] 2/9 10/11/2017 07/10/2017  Decreased Interest 0 0  Down, Depressed, Hopeless 0 0  PHQ - 2 Score 0 0    Quality of Life: Quality of Life - 10/11/17 1100      Quality of Life Scores   Health/Function Pre  23.3 %    Health/Function Post  24.3 %    Health/Function % Change  4.29 %    Socioeconomic Pre  24.86 %    Socioeconomic Post  24 %    Socioeconomic % Change   -3.46 %    Psych/Spiritual Pre  23.29 %     Psych/Spiritual Post  21.71 %    Psych/Spiritual % Change  -6.78 %    Family Pre  25.5 %    Family Post  25.9 %    Family % Change  1.57 %    GLOBAL Pre  23.89 %    GLOBAL Post  23.94 %    GLOBAL % Change  0.21 %       Personal Goals: Goals established at orientation with interventions provided to work toward goal. Personal Goals and Risk Factors at Admission - 07/06/17 1017      Core Components/Risk Factors/Patient Goals on Admission    Weight Management  Yes;Obesity;Weight Loss;Weight Maintenance    Intervention  Weight Management: Develop a combined nutrition and exercise program designed to reach desired caloric intake, while maintaining appropriate intake of nutrient and fiber, sodium and fats, and appropriate energy expenditure required for the weight goal.;Weight Management: Provide education and appropriate resources to help participant work on and attain dietary goals.;Weight Management/Obesity: Establish reasonable short term and long term weight goals.;Obesity: Provide education and appropriate resources to help participant work on and attain dietary goals.    Expected Outcomes  Short Term: Continue to assess and modify interventions until short term weight is achieved;Long Term: Adherence to nutrition and physical activity/exercise program aimed toward attainment of established weight goal;Weight Maintenance: Understanding of the daily nutrition  guidelines, which includes 25-35% calories from fat, 7% or less cal from saturated fats, less than 252m cholesterol, less than 1.5gm of sodium, & 5 or more servings of fruits and vegetables daily;Understanding recommendations for meals to include 15-35% energy as protein, 25-35% energy from fat, 35-60% energy from carbohydrates, less than 2082mof dietary cholesterol, 20-35 gm of total fiber daily;Weight Loss: Understanding of general recommendations for a balanced deficit meal plan, which promotes 1-2 lb weight loss per week and includes a  negative energy balance of [phone] kcal/d;Understanding of distribution of calorie intake throughout the day with the consumption of 4-5 meals/snacks    Hypertension  Yes    Intervention  Provide education on lifestyle modifcations including regular physical activity/exercise, weight management, moderate sodium restriction and increased consumption of fresh fruit, vegetables, and low fat dairy, alcohol moderation, and smoking cessation.;Monitor prescription use compliance.    Expected Outcomes  Short Term: Continued assessment and intervention until BP is < 140/9044mG in hypertensive participants. < 130/25m57m in hypertensive participants with diabetes, heart failure or chronic kidney disease.;Long Term: Maintenance of blood pressure at goal levels.    Lipids  Yes    Intervention  Provide education and support for participant on nutrition & aerobic/resistive exercise along with prescribed medications to achieve LDL <70mg33mL >40mg.58mExpected Outcomes  Short Term: Participant states understanding of desired cholesterol values and is compliant with medications prescribed. Participant is following exercise prescription and nutrition guidelines.;Long Term: Cholesterol controlled with medications as prescribed, with individualized exercise RX and with personalized nutrition plan. Value goals: LDL < 70mg, 39m> 40 mg.    Stress  Yes    Intervention  Offer individual and/or small group education and counseling on adjustment to heart disease, stress management and health-related lifestyle change. Teach and support self-help strategies.;Refer participants experiencing significant psychosocial distress to appropriate mental health specialists for further evaluation and treatment. When possible, include family members and significant others in education/counseling sessions.    Expected Outcomes  Short Term: Participant demonstrates changes in health-related behavior, relaxation and other stress management  skills, ability to obtain effective social support, and compliance with psychotropic medications if prescribed.;Long Term: Emotional wellbeing is indicated by absence of clinically significant psychosocial distress or social isolation.        Personal Goals Discharge: Goals and Risk Factor Review    Row Name 07/27/17 1009 08/23/17 1700 09/15/17 1632 10/11/17 1103 10/25/17 1107     Core Components/Risk Factors/Patient Goals Review   Personal Goals Review  Weight Management/Obesity;Lipids;Hypertension;Stress  Weight Management/Obesity;Lipids;Hypertension;Stress  Weight Management/Obesity;Lipids;Hypertension;Stress  Weight Management/Obesity;Lipids;Hypertension;Stress  Weight Management/Obesity   Review  - Trayven haLadenen doing well with exercise at cardiac rehab, Kiptyn's vital sign have been stable  Oluwafemi coSosaiaues to do well with exercise. Jaylun's vital signs have been stable   Keevon coBonifacioues to do well with exercise. Kenton's vital signs have been stable   Future coCarlousues to do well with exercise. Hansel's vital signs have been stable   Pt has lost 3.8 lb. Wt loss goal not met.    Expected Outcomes  Harper wiKaionontinue to exercise at cardiac rehab and take his medications as presribed  Dolores wiCutterontinue to exercise at cardiac rehab and take his medications as presribed  Justis wiNaveenontinue to exercise at cardiac rehab and take his medications as presribed  Jacquese wiAmedontinue to take his medications as presribed  Continue to encourage slow, wt loss of 1-2 lb/week to a wt loss goal of  188 lb.       Exercise Goals and Review: Exercise Goals    Row Name 07/06/17 0839 07/06/17 1019           Exercise Goals   Increase Physical Activity  Yes  -      Intervention  Provide advice, education, support and counseling about physical activity/exercise needs.;Develop an individualized exercise prescription for aerobic and resistive training based on initial evaluation findings, risk stratification, comorbidities and  participant's personal goals.  -      Expected Outcomes  Achievement of increased cardiorespiratory fitness and enhanced flexibility, muscular endurance and strength shown through measurements of functional capacity and personal statement of participant.  -      Increase Strength and Stamina  Yes increase muscle tone and get stronger  Yes return to jogging, increase muscle tone      Intervention  Provide advice, education, support and counseling about physical activity/exercise needs.;Develop an individualized exercise prescription for aerobic and resistive training based on initial evaluation findings, risk stratification, comorbidities and participant's personal goals.  -      Expected Outcomes  Achievement of increased cardiorespiratory fitness and enhanced flexibility, muscular endurance and strength shown through measurements of functional capacity and personal statement of participant.  -         Nutrition & Weight - Outcomes: Pre Biometrics - 07/06/17 1017      Pre Biometrics   Waist Circumference  39.5 inches    Hip Circumference  41 inches    Waist to Hip Ratio  0.96 %    Triceps Skinfold  19 mm    % Body Fat  29.5 %    Grip Strength  33 kg    Flexibility  14 in    Single Leg Stand  30 seconds      Post Biometrics - 10/11/17 1100       Post  Biometrics   Height  5' 7.75" (1.721 m)    Weight  201 lb 15.1 oz (91.6 kg)    Waist Circumference  38.75 inches    Hip Circumference  39.75 inches    Waist to Hip Ratio  0.97 %    BMI (Calculated)  30.93    Triceps Skinfold  12 mm    % Body Fat  27 %    Grip Strength  44 kg    Flexibility  16 in    Single Leg Stand  30 seconds       Nutrition: Nutrition Therapy & Goals - 07/06/17 1224      Nutrition Therapy   Diet  Therapeutic Lifestyle Changes      Personal Nutrition Goals   Nutrition Goal  Pt to identify food quantities necessary to achieve weight loss of 6-12 lb at graduation from cardiac rehab. Goal wt of 188 lb  desired.       Intervention Plan   Intervention  Prescribe, educate and counsel regarding individualized specific dietary modifications aiming towards targeted core components such as weight, hypertension, lipid management, diabetes, heart failure and other comorbidities.    Expected Outcomes  Short Term Goal: Understand basic principles of dietary content, such as calories, fat, sodium, cholesterol and nutrients.;Long Term Goal: Adherence to prescribed nutrition plan.       Nutrition Discharge: Nutrition Assessments - 10/16/17 0906      MEDFICTS Scores   Pre Score  6    Post Score  0    Score Difference  -6       Education  Questionnaire Score: Knowledge Questionnaire Score - 10/11/17 1100      Knowledge Questionnaire Score   Pre Score  25/28    Post Score  24/24       Goals reviewed with patient; copy given to patient. Dominic Ray graduated from cardiac rehab program today with completion of 36 exercise sessions in Phase II. Pt maintained good attendance and progressed nicely during his participation in rehab as evidenced by increased MET level.   Medication list reconciled. Repeat  PHQ score-0  .  Pt has made significant lifestyle changes and should be commended for his success. Pt feels he has achieved his goals during cardiac rehab. Dominic Ray returned to playing softball and was able to participate in the world series for his league.   Pt plans to continue exercise at the gym. We are proud of Alver's progression. Arsalan increased his distance on his post walk test walk test and lost weight.Barnet Pall, RN,BSN 10/31/2017 9:03 AM.

## 2017-10-11 NOTE — Progress Notes (Deleted)
HPI: FU CAD. Echocardiogram May 2018 showed normal LV systolic function and mild left atrial enlargement. Cardiac catheterization June 2018 showed a 70% D1, 90 mid LAD, 90% proximal right coronary artery followed by a 100% lesion and occluded circumflex. Ejection fraction 50-55%. Carotid Dopplers June 2018 with no significant stenosis. Patient subsequently underwent coronary artery bypassing graft with a LIMA  The LAD, saphenous pain graft to the diagonal, saphenous vein graft to the acute marginal and saphenous vein graft to the PDA. Since last seen,   Current Outpatient Medications  Medication Sig Dispense Refill  . aspirin EC 325 MG EC tablet Take 1 tablet (325 mg total) by mouth daily. 30 tablet 0  . atorvastatin (LIPITOR) 80 MG tablet Take 1 tablet (80 mg total) by mouth daily at 6 PM. 30 tablet 12  . metoprolol tartrate (LOPRESSOR) 25 MG tablet Take 1 tablet (25 mg total) by mouth 2 (two) times daily. 60 tablet 12   No current facility-administered medications for this visit.      Past Medical History:  Diagnosis Date  . Basal cell carcinoma of nasal tip 08/24/2012  . Coronary artery disease    a. Abnl stress test -> s/p CABGx4 in 04/2017 with LIMA-LAD, SVG-diag, SVG-AM, SVG-PDA.  Marland Kitchen Hyperlipidemia   . Hypertension   . Male hypogonadism 08/24/2012  . Onychomycosis 08/24/2012    Past Surgical History:  Procedure Laterality Date  . CARDIAC CATHETERIZATION  05/08/2017  . CARDIOVASCULAR STRESS TEST  04/2017  . CORONARY ARTERY BYPASS GRAFT N/A 05/09/2017   Procedure: CORONARY ARTERY BYPASS GRAFTING times four using left internal mammary artery and right leg saphenous vein  ;  Surgeon: Gaye Pollack, MD;  Location: [facility];  Service: Open Heart Surgery;  Laterality: N/A;  . LEFT HEART CATH AND CORONARY ANGIOGRAPHY N/A 05/08/2017   Procedure: Left Heart Cath and Coronary Angiography;  Surgeon: Troy Sine, MD;  Location: [facility];  Service: Cardiovascular;   Laterality: N/A;  . TEE WITHOUT CARDIOVERSION N/A 05/09/2017   Procedure: TRANSESOPHAGEAL ECHOCARDIOGRAM (TEE);  Surgeon: Gaye Pollack, MD;  Location: [facility];  Service: Open Heart Surgery;  Laterality: N/A;  . TONSILLECTOMY  1968    Social History   Socioeconomic History  . Marital status: Married    Spouse name: Not on file  . Number of children: Not on file  . Years of education: Not on file  . Highest education level: Not on file  Social Needs  . Financial resource strain: Not on file  . Food insecurity - worry: Not on file  . Food insecurity - inability: Not on file  . Transportation needs - medical: Not on file  . Transportation needs - non-medical: Not on file  Occupational History    Comment: Unemployed  Tobacco Use  . Smoking status: Never Smoker  . Smokeless tobacco: Never Used  Substance and Sexual Activity  . Alcohol use: Yes    Comment: Occasional  . Drug use: No  . Sexual activity: Not on file  Other Topics Concern  . Not on file  Social History Narrative  . Not on file    Family History  Problem Relation Age of Onset  . CAD Father        Died of MI at age 36  . Cancer Mother   . Colon cancer Neg Hx   . Stomach cancer Neg Hx     ROS: no fevers or chills, productive cough, hemoptysis, dysphasia, odynophagia, melena,  hematochezia, dysuria, hematuria, rash, seizure activity, orthopnea, PND, pedal edema, claudication. Remaining systems are negative.  Physical Exam: Well-developed well-nourished in no acute distress.  Skin is warm and dry.  HEENT is normal.  Neck is supple.  Chest is clear to auscultation with normal expansion.  Cardiovascular exam is regular rate and rhythm.  Abdominal exam nontender or distended. No masses palpated. Extremities show no edema. neuro grossly intact  ECG- personally reviewed  A/P  1  Kirk Ruths, MD

## 2017-10-18 ENCOUNTER — Ambulatory Visit: Payer: BLUE CROSS/BLUE SHIELD

## 2017-10-24 NOTE — Progress Notes (Signed)
HPI: FU CAD. Echocardiogram May 2018 showed normal LV systolic function and mild left atrial enlargement. Cardiac catheterization June 2018 showed a 70% D1, 90 mid LAD, 90% proximal right coronary artery followed by a 100% lesion and occluded circumflex. Ejection fraction 50-55%. Carotid Dopplers June 2018 with no significant stenosis. Patient subsequently underwent coronary artery bypassing graft with a LIMA  The LAD, saphenous pain graft to the diagonal, saphenous vein graft to the acute marginal and saphenous vein graft to the PDA. Since last seen, the patient denies any dyspnea on exertion, orthopnea, PND, pedal edema, palpitations, syncope or chest pain.   Current Outpatient Medications  Medication Sig Dispense Refill  . aspirin EC 325 MG EC tablet Take 1 tablet (325 mg total) by mouth daily. 30 tablet 0  . atorvastatin (LIPITOR) 80 MG tablet Take 1 tablet (80 mg total) by mouth daily at 6 PM. 30 tablet 12  . metoprolol tartrate (LOPRESSOR) 25 MG tablet Take 1 tablet (25 mg total) by mouth 2 (two) times daily. 60 tablet 12   No current facility-administered medications for this visit.      Past Medical History:  Diagnosis Date  . Basal cell carcinoma of nasal tip 08/24/2012  . Coronary artery disease    a. Abnl stress test -> s/p CABGx4 in 04/2017 with LIMA-LAD, SVG-diag, SVG-AM, SVG-PDA.  Marland Kitchen Hyperlipidemia   . Hypertension   . Male hypogonadism 08/24/2012  . Onychomycosis 08/24/2012    Past Surgical History:  Procedure Laterality Date  . CARDIAC CATHETERIZATION  05/08/2017  . CARDIOVASCULAR STRESS TEST  04/2017  . CORONARY ARTERY BYPASS GRAFT N/A 05/09/2017   Procedure: CORONARY ARTERY BYPASS GRAFTING times four using left internal mammary artery and right leg saphenous vein  ;  Surgeon: Gaye Pollack, MD;  Location: [facility];  Service: Open Heart Surgery;  Laterality: N/A;  . LEFT HEART CATH AND CORONARY ANGIOGRAPHY N/A 05/08/2017   Procedure: Left Heart Cath and Coronary  Angiography;  Surgeon: Troy Sine, MD;  Location: [facility];  Service: Cardiovascular;  Laterality: N/A;  . TEE WITHOUT CARDIOVERSION N/A 05/09/2017   Procedure: TRANSESOPHAGEAL ECHOCARDIOGRAM (TEE);  Surgeon: Gaye Pollack, MD;  Location: [facility];  Service: Open Heart Surgery;  Laterality: N/A;  . TONSILLECTOMY  1968    Social History   Socioeconomic History  . Marital status: Married    Spouse name: Not on file  . Number of children: Not on file  . Years of education: Not on file  . Highest education level: Not on file  Social Needs  . Financial resource strain: Not on file  . Food insecurity - worry: Not on file  . Food insecurity - inability: Not on file  . Transportation needs - medical: Not on file  . Transportation needs - non-medical: Not on file  Occupational History    Comment: Unemployed  Tobacco Use  . Smoking status: Never Smoker  . Smokeless tobacco: Never Used  Substance and Sexual Activity  . Alcohol use: Yes    Comment: Occasional  . Drug use: No  . Sexual activity: Not on file  Other Topics Concern  . Not on file  Social History Narrative  . Not on file    Family History  Problem Relation Age of Onset  . CAD Father        Died of MI at age 66  . Cancer Mother   . Colon cancer Neg Hx   . Stomach cancer Neg  Hx     ROS: no fevers or chills, productive cough, hemoptysis, dysphasia, odynophagia, melena, hematochezia, dysuria, hematuria, rash, seizure activity, orthopnea, PND, pedal edema, claudication. Remaining systems are negative.  Physical Exam: Well-developed well-nourished in no acute distress.  Skin is warm and dry.  HEENT is normal.  Neck is supple.  Chest is clear to auscultation with normal expansion. Sternotomy without evidence of infection. Cardiovascular exam is regular rate and rhythm.  Abdominal exam nontender or distended. No masses palpated. Extremities show no edema. neuro grossly intact    A/P  1 Coronary  artery disease status post coronary artery bypass graft-patient is doing well with no chest pain or significant dyspnea. Plan to continue medical therapy with aspirin and statin.  2 hyperlipidemia-continue statin.  3 hypertension-blood pressure is controlled. Continue present medications.  Kirk Ruths, MD

## 2017-10-25 ENCOUNTER — Ambulatory Visit: Payer: BLUE CROSS/BLUE SHIELD | Admitting: Cardiology

## 2017-10-25 ENCOUNTER — Encounter: Payer: Self-pay | Admitting: Cardiology

## 2017-10-27 ENCOUNTER — Encounter: Payer: Self-pay | Admitting: Cardiology

## 2017-10-27 ENCOUNTER — Ambulatory Visit (INDEPENDENT_AMBULATORY_CARE_PROVIDER_SITE_OTHER): Payer: BLUE CROSS/BLUE SHIELD | Admitting: Cardiology

## 2017-10-27 VITALS — BP 108/70 | HR 64 | Ht 68.0 in | Wt 206.0 lb

## 2017-10-27 DIAGNOSIS — I10 Essential (primary) hypertension: Secondary | ICD-10-CM

## 2017-10-27 DIAGNOSIS — I2510 Atherosclerotic heart disease of native coronary artery without angina pectoris: Secondary | ICD-10-CM

## 2017-10-27 DIAGNOSIS — E7800 Pure hypercholesterolemia, unspecified: Secondary | ICD-10-CM | POA: Diagnosis not present

## 2017-10-27 NOTE — Patient Instructions (Signed)
Continue same medications   Your physician wants you to follow-up 6 months with Dr.Crenshaw. You will receive a reminder letter in the mail two months in advance. If you don't receive a letter, please call our office to schedule the follow-up appointment.

## 2017-11-22 ENCOUNTER — Ambulatory Visit (INDEPENDENT_AMBULATORY_CARE_PROVIDER_SITE_OTHER): Payer: BLUE CROSS/BLUE SHIELD | Admitting: Sports Medicine

## 2017-11-22 ENCOUNTER — Encounter: Payer: Self-pay | Admitting: Sports Medicine

## 2017-11-22 DIAGNOSIS — Z0000 Encounter for general adult medical examination without abnormal findings: Secondary | ICD-10-CM

## 2017-11-22 DIAGNOSIS — N63 Unspecified lump in unspecified breast: Secondary | ICD-10-CM

## 2017-11-22 DIAGNOSIS — N139 Obstructive and reflux uropathy, unspecified: Secondary | ICD-10-CM | POA: Diagnosis not present

## 2017-11-22 MED ORDER — TAMSULOSIN HCL 0.4 MG PO CAPS: 0.4000 mg | ORAL_CAPSULE | Freq: Every day | ORAL | 3 refills | Status: DC

## 2017-11-22 NOTE — Patient Instructions (Signed)
Benign Prostatic Hyperplasia  Benign prostatic hyperplasia (BPH) is an enlarged prostate gland that is caused by the normal aging process and not by cancer. The prostate is a walnut-sized gland that is involved in the production of semen. It is located in front of the rectum and below the bladder. The bladder stores urine and the urethra is the tube that carries the urine out of the body. The prostate may get bigger as a man gets older.  An enlarged prostate can press on the urethra. This can make it harder to pass urine. The build-up of urine in the bladder can cause infection. Back pressure and infection may progress to bladder damage and kidney (renal) failure.  What are the causes?  This condition is part of a normal aging process. However, not all men develop problems from this condition. If the prostate enlarges away from the urethra, urine flow will not be blocked. If it enlarges toward the urethra and compresses it, there will be problems passing urine.  What increases the risk?  This condition is more likely to develop in men over the age of 50 years.  What are the signs or symptoms?  Symptoms of this condition include:  · Getting up often during the night to urinate.  · Needing to urinate frequently during the day.  · Difficulty starting urine flow.  · Decrease in size and strength of your urine stream.  · Leaking (dribbling) after urinating.  · Inability to pass urine. This needs immediate treatment.  · Inability to completely empty your bladder.  · Pain when you pass urine. This is more common if there is also an infection.  · Urinary tract infection (UTI).    How is this diagnosed?  This condition is diagnosed based on your medical history, a physical exam, and your symptoms. Tests will also be done, such as:  · A post-void bladder scan. This measures any amount of urine that may remain in your bladder after you finish urinating.  · A digital rectal exam. In a rectal exam, your health care provider  checks your prostate by putting a lubricated, gloved finger into your rectum to feel the back of your prostate gland. This exam detects the size of your gland and any abnormal lumps or growths.  · An exam of your urine (urinalysis).  · A prostate specific antigen (PSA) screening. This is a blood test used to screen for prostate cancer.  · An ultrasound. This test uses sound waves to electronically produce a picture of your prostate gland.    Your health care provider may refer you to a specialist in kidney and prostate diseases (urologist).  How is this treated?  Once symptoms begin, your health care provider will monitor your condition (active surveillance or watchful waiting). Treatment for this condition will depend on the severity of your condition. Treatment may include:  · Observation and yearly exams. This may be the only treatment needed if your condition and symptoms are mild.  · Medicines to relieve your symptoms, including:  ? Medicines to shrink the prostate.  ? Medicines to relax the muscle of the prostate.  · Surgery in severe cases. Surgery may include:  ? Prostatectomy. In this procedure, the prostate tissue is removed completely through an open incision or with a laparascope or robotics.  ? Transurethral resection of the prostate (TURP). In this procedure, a tool is inserted through the opening at the tip of the penis (urethra). It is used to cut away tissue of   the inner core of the prostate. The pieces are removed through the same opening of the penis. This removes the blockage.  ? Transurethral incision (TUIP). In this procedure, small cuts are made in the prostate. This lessens the prostate's pressure on the urethra.  ? Transurethral microwave thermotherapy (TUMT). This procedure uses microwaves to create heat. The heat destroys and removes a small amount of prostate tissue.  ? Transurethral needle ablation (TUNA). This procedure uses radio frequencies to destroy and remove a small amount of  prostate tissue.  ? Interstitial laser coagulation (ILC). This procedure uses a laser to destroy and remove a small amount of prostate tissue.  ? Transurethral electrovaporization (TUVP). This procedure uses electrodes to destroy and remove a small amount of prostate tissue.  ? Prostatic urethral lift. This procedure inserts an implant to push the lobes of the prostate away from the urethra.    Follow these instructions at home:  · Take over-the-counter and prescription medicines only as told by your health care provider.  · Monitor your symptoms for any changes. Contact your health care provider with any changes.  · Avoid drinking large amounts of liquid before going to bed or out in public.  · Avoid or reduce how much caffeine or alcohol you drink.  · Give yourself time when you urinate.  · Keep all follow-up visits as told by your health care provider. This is important.  Contact a health care provider if:  · You have unexplained back pain.  · Your symptoms do not get better with treatment.  · You develop side effects from the medicine you are taking.  · Your urine becomes very dark or has a bad smell.  · Your lower abdomen becomes distended and you have trouble passing your urine.  Get help right away if:  · You have a fever or chills.  · You suddenly cannot urinate.  · You feel lightheaded, or very dizzy, or you faint.  · There are large amounts of blood or clots in the urine.  · Your urinary problems become hard to manage.  · You develop moderate to severe low back or flank pain. The flank is the side of your body between the ribs and the hip.  These symptoms may represent a serious problem that is an emergency. Do not wait to see if the symptoms will go away. Get medical help right away. Call your local emergency services (911 in the [place] Do not drive yourself to the hospital.  Summary  · Benign prostatic hyperplasia (BPH) is an enlarged prostate that is caused by the normal aging process and not by  cancer.  · An enlarged prostate can press on the urethra. This can make it hard to pass urine.  · This condition is part of a normal aging process and is more likely to develop in men over the age of 50 years.  · Get help right away if you suddenly cannot urinate.  This information is not intended to replace advice given to you by your health care provider. Make sure you discuss any questions you have with your health care provider.  Document Released: 10/31/2005 Document Revised: 12/05/2016 Document Reviewed: 12/05/2016  Elsevier Interactive Patient Education © 2018 Elsevier Inc[employer]

## 2017-11-22 NOTE — Assessment & Plan Note (Signed)
Patient has noted some asymmetry in his breasts, I am going to send him to the breast center for imaging.

## 2017-11-22 NOTE — Assessment & Plan Note (Signed)
Adding routine labs, we did discuss PSA screening, patient would like to proceed.

## 2017-11-22 NOTE — Assessment & Plan Note (Signed)
Adding Flomax, as well as some information, patient will look into this before he tries it.

## 2017-11-22 NOTE — Progress Notes (Signed)
Subjective:    CC: Annual physical exam  HPI:  Dominic Ray is here for a physical, a lot has happened since I last saw him, but he had a few episodes of stable angina, currently catheterization showed coronary artery disease that was extensive enough to require four-vessel CABG.  He did well postoperatively and postoperatively, no further chest pain.  Blood pressure and cholesterol are stable on current medications.  He is due for some more routine blood work, up-to-date on colon cancer screening.  Obstructive symptoms: With frequency, hesitancy, dribbling and multiple episodes of nocturia.  No burning, no constitutional symptoms.  Of note he has noted some breast asymmetry, does have a strong family history of breast cancer and would like some breast imaging.  I reviewed the past medical history, family history, social history, surgical history, and allergies today and no changes were needed.  Please see the problem list section below in epic for further details.  Past Medical History: Past Medical History:  Diagnosis Date  . Basal cell carcinoma of nasal tip 08/24/2012  . Coronary artery disease    a. Abnl stress test -> s/p CABGx4 in 04/2017 with LIMA-LAD, SVG-diag, SVG-AM, SVG-PDA.  Marland Kitchen Hyperlipidemia   . Hypertension   . Male hypogonadism 08/24/2012  . Onychomycosis 08/24/2012   Past Surgical History: Past Surgical History:  Procedure Laterality Date  . CARDIAC CATHETERIZATION  05/08/2017  . CARDIOVASCULAR STRESS TEST  04/2017  . CORONARY ARTERY BYPASS GRAFT N/A 05/09/2017   Procedure: CORONARY ARTERY BYPASS GRAFTING times four using left internal mammary artery and right leg saphenous vein  ;  Surgeon: Gaye Pollack, MD;  Location: [facility];  Service: Open Heart Surgery;  Laterality: N/A;  . LEFT HEART CATH AND CORONARY ANGIOGRAPHY N/A 05/08/2017   Procedure: Left Heart Cath and Coronary Angiography;  Surgeon: Troy Sine, MD;  Location: [facility];  Service:  Cardiovascular;  Laterality: N/A;  . TEE WITHOUT CARDIOVERSION N/A 05/09/2017   Procedure: TRANSESOPHAGEAL ECHOCARDIOGRAM (TEE);  Surgeon: Gaye Pollack, MD;  Location: [facility];  Service: Open Heart Surgery;  Laterality: N/A;  . TONSILLECTOMY  1968   Social History: Social History   Socioeconomic History  . Marital status: Married    Spouse name: None  . Number of children: None  . Years of education: None  . Highest education level: None  Social Needs  . Financial resource strain: None  . Food insecurity - worry: None  . Food insecurity - inability: None  . Transportation needs - medical: None  . Transportation needs - non-medical: None  Occupational History    Comment: Unemployed  Tobacco Use  . Smoking status: Never Smoker  . Smokeless tobacco: Never Used  Substance and Sexual Activity  . Alcohol use: Yes    Comment: Occasional  . Drug use: No  . Sexual activity: None  Other Topics Concern  . None  Social History Narrative  . None   Family History: Family History  Problem Relation Age of Onset  . CAD Father        Died of MI at age 79  . Cancer Mother   . Colon cancer Neg Hx   . Stomach cancer Neg Hx    Allergies: No Known Allergies Medications: See med rec.  Review of Systems: No headache, visual changes, nausea, vomiting, diarrhea, constipation, dizziness, abdominal pain, skin rash, fevers, chills, night sweats, swollen lymph nodes, weight loss, chest pain, body aches, joint swelling, muscle aches, shortness of breath, mood changes,  visual or auditory hallucinations.  Objective:    General: Well Developed, well nourished, and in no acute distress.  Neuro: Alert and oriented x3, extra-ocular muscles intact, sensation grossly intact. Cranial nerves II through XII are intact, motor, sensory, and coordinative functions are all intact. HEENT: Normocephalic, atraumatic, pupils equal round reactive to light, neck supple, no masses, no lymphadenopathy, thyroid  nonpalpable. Oropharynx, nasopharynx, external ear canals are unremarkable. Skin: Warm and dry, no rashes noted.  Cardiac: Regular rate and rhythm, no murmurs rubs or gallops.  Respiratory: Clear to auscultation bilaterally. Not using accessory muscles, speaking in full sentences.  Breasts are slightly asymmetric, no masses. Abdominal: Soft, nontender, nondistended, positive bowel sounds, no masses, no organomegaly.  Musculoskeletal: Shoulder, elbow, wrist, hip, knee, ankle stable, and with full range of motion. Rectal: Good tone, smooth prostate, Hemoccult negative.  Impression and Recommendations:    The patient was counselled, risk factors were discussed, anticipatory guidance given.  Annual physical exam Adding routine labs, we did discuss PSA screening, patient would like to proceed.  Obstructive uropathy Adding Flomax, as well as some information, patient will look into this before he tries it.  Breast mass in male Patient has noted some asymmetry in his breasts, I am going to send him to the breast center for imaging.  ___________________________________________ Gwen Her. Dianah Field, M.D., ABFM., CAQSM. Primary Care and [place] Instructor of [place] [employer] [facility] [employer]

## 2017-12-05 ENCOUNTER — Telehealth: Payer: Self-pay | Admitting: Sports Medicine

## 2017-12-07 NOTE — Telephone Encounter (Signed)
Informed pt orders already places and transferred to imaging for appointment.

## 2017-12-11 DIAGNOSIS — Z0000 Encounter for general adult medical examination without abnormal findings: Secondary | ICD-10-CM | POA: Diagnosis not present

## 2017-12-11 DIAGNOSIS — N139 Obstructive and reflux uropathy, unspecified: Secondary | ICD-10-CM | POA: Diagnosis not present

## 2017-12-12 LAB — LIPID PANEL W/REFLEX DIRECT LDL
Cholesterol: 91 mg/dL (ref <200)
HDL: 47 mg/dL (ref >40)
LDL Cholesterol (Calc): 29 mg/dL (calc)
Non-HDL Cholesterol (Calc): 44 mg/dL (calc) (ref <130)
Total CHOL/HDL Ratio: 1.9 (calc) (ref <5.0)
Triglycerides: 70 mg/dL (ref <150)

## 2017-12-12 LAB — CBC
HCT: 45 % (ref 38.5–50.0)
Hemoglobin: 15.1 g/dL (ref 13.2–17.1)
MCH: 28.3 pg (ref 27.0–33.0)
MCHC: 33.6 g/dL (ref 32.0–36.0)
MCV: 84.3 fL (ref 80.0–100.0)
MPV: 11.7 fL (ref 7.5–12.5)
Platelets: 261 10*3/uL (ref 140–400)
RBC: 5.34 10*6/uL (ref 4.20–5.80)
RDW: 13.6 % (ref 11.0–15.0)
WBC: 6.2 10*3/uL (ref 3.8–10.8)

## 2017-12-12 LAB — HEPATITIS C ANTIBODY
Hepatitis C Ab: NONREACTIVE
SIGNAL TO CUT-OFF: 0.04 (ref <1.00)

## 2017-12-12 LAB — URINALYSIS
Bilirubin Urine: NEGATIVE
Glucose, UA: NEGATIVE
Hgb urine dipstick: NEGATIVE
Ketones, ur: NEGATIVE
Leukocytes, UA: NEGATIVE
Nitrite: NEGATIVE
Protein, ur: NEGATIVE
Specific Gravity, Urine: 1.016 (ref 1.001–1.03)
pH: <=5 (ref 5.0–8.0)

## 2017-12-12 LAB — COMPREHENSIVE METABOLIC PANEL
AG Ratio: 1.2 (calc) (ref 1.0–2.5)
ALT: 15 U/L (ref 9–46)
AST: 15 U/L (ref 10–35)
Albumin: 3.8 g/dL (ref 3.6–5.1)
Alkaline phosphatase (APISO): 75 U/L (ref 40–115)
BUN: 15 mg/dL (ref 7–25)
CO2: 29 mmol/L (ref 20–32)
Calcium: 9.1 mg/dL (ref 8.6–10.3)
Chloride: 103 mmol/L (ref 98–110)
Creat: 0.94 mg/dL (ref 0.70–1.25)
Globulin: 3.1 g/dL (calc) (ref 1.9–3.7)
Glucose, Bld: 88 mg/dL (ref 65–99)
Potassium: 4.4 mmol/L (ref 3.5–5.3)
Sodium: 139 mmol/L (ref 135–146)
Total Bilirubin: 0.8 mg/dL (ref 0.2–1.2)
Total Protein: 6.9 g/dL (ref 6.1–8.1)

## 2017-12-12 LAB — HEMOGLOBIN A1C
Hgb A1c MFr Bld: 5.2 % of total Hgb (ref <5.7)
Mean Plasma Glucose: 103 (calc)
eAG (mmol/L): 5.7 (calc)

## 2017-12-12 LAB — VITAMIN D 25 HYDROXY (VIT D DEFICIENCY, FRACTURES): Vit D, 25-Hydroxy: 39 ng/mL (ref 30–100)

## 2017-12-12 LAB — HIV ANTIBODY (ROUTINE TESTING W REFLEX): HIV 1&2 Ab, 4th Generation: NONREACTIVE

## 2017-12-12 LAB — TSH: TSH: 1.08 mIU/L (ref 0.40–4.50)

## 2018-01-03 ENCOUNTER — Ambulatory Visit: Payer: BLUE CROSS/BLUE SHIELD | Admitting: Cardiology

## 2018-01-05 ENCOUNTER — Other Ambulatory Visit: Payer: Self-pay | Admitting: Sports Medicine

## 2018-01-10 ENCOUNTER — Telehealth: Payer: Self-pay

## 2018-01-10 NOTE — Telephone Encounter (Signed)
Breast Center called stating they spoke with the pt who states he does not have a breast mass just asymmetry. [place] tech stated that they can not perform an [place] until they do a diagnostic mammogram of both breast, but to do the mammogram pt must have an actual issue even if he does have family history. Please advise.

## 2018-01-11 NOTE — Telephone Encounter (Signed)
OK to cancel orders.

## 2018-04-10 IMAGING — DX DG CHEST 1V PORT
1 series · 1 of 1 positions shown · non-contrast
Comparison: 05/09/2017 .

CLINICAL DATA: Chest tube.

EXAM:
PORTABLE CHEST 1 VIEW

[chest ap]
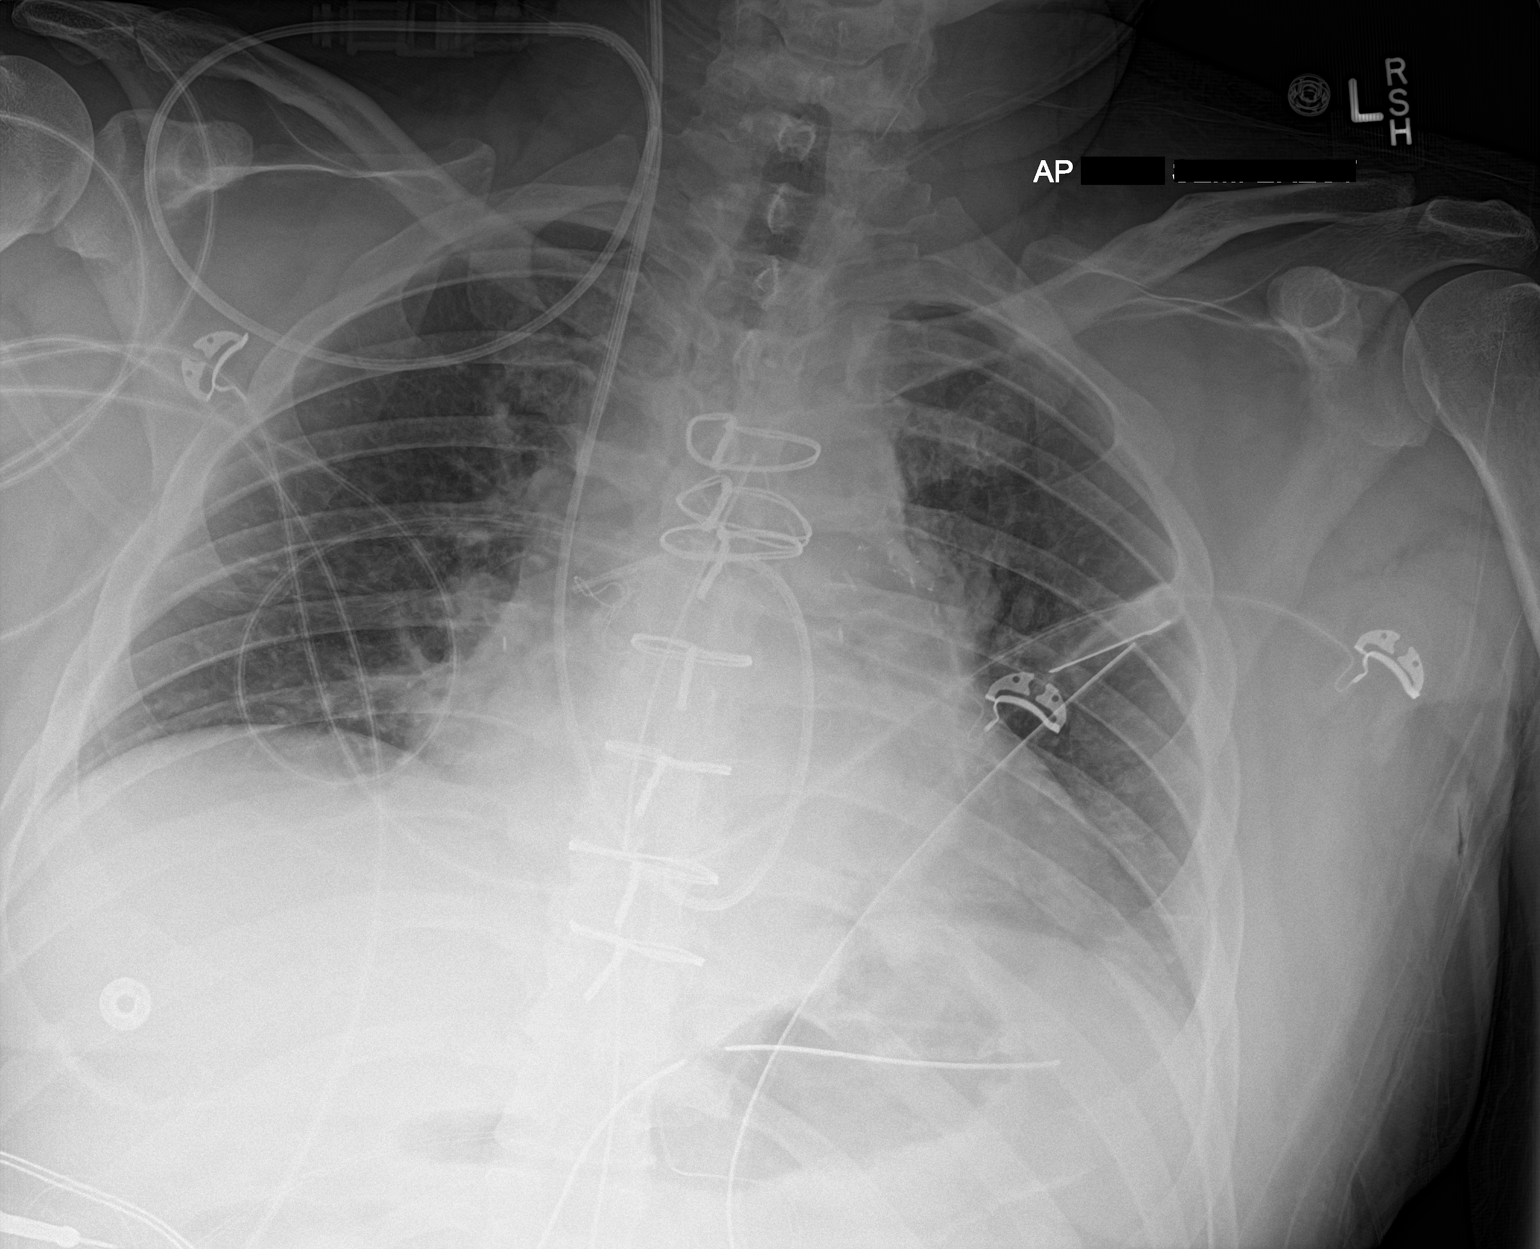

[1 of 1 positions shown; findings below may reference images not displayed]

FINDINGS: Interim extubation removal of NG tube. Left chest tubes, Swan-Ganz
catheter, mediastinal drainage catheter, left chest tubes in stable
position. Cardiomegaly with mild pulmonary vascular prominence. Low
lung volumes with basilar atelectasis. No pneumothorax.
IMPRESSION: 1. Interim extubation and removal of NG tube. Remaining lines and
tubes including left chest tubes in stable position. No
pneumothorax.

2.  Prior CABG.  Stable cardiomegaly.

3. Low lung volumes with basilar atelectasis .

## 2018-04-11 IMAGING — DX DG CHEST 1V PORT
1 series · 1 of 1 positions shown · non-contrast
Comparison: Yesterday

CLINICAL DATA: Sore chest.  CABG.

EXAM:
PORTABLE CHEST 1 VIEW

[chest ap]
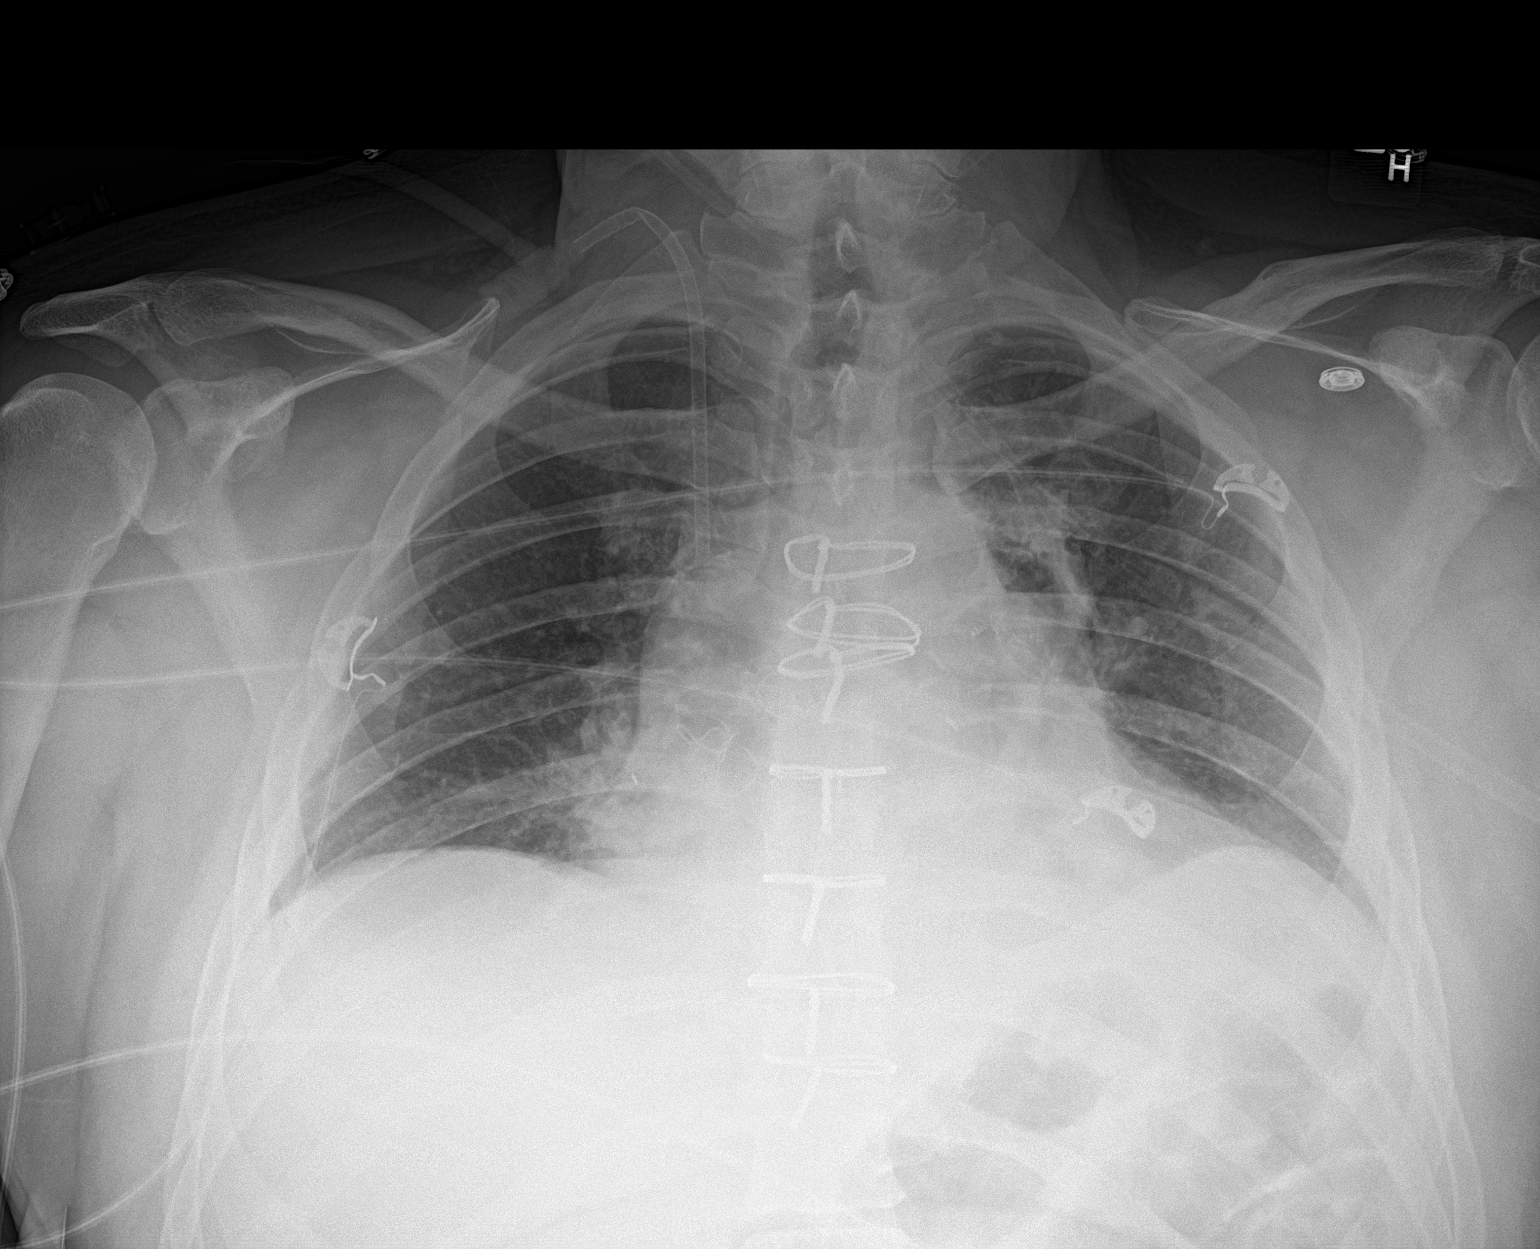

[1 of 1 positions shown; findings below may reference images not displayed]

FINDINGS: Thi Ly removed. Introducer in the right jugular vein remains in
place. Left chest tubes removed. No ensuing pneumothorax. Low
volumes. Bibasilar atelectasis. No sign of pulmonary edema.
IMPRESSION: Left chest tube removal without pneumothorax.

Bibasilar atelectasis

No sign of CHF.

## 2018-05-01 NOTE — Progress Notes (Signed)
HPI: FU CAD. Echocardiogram May 2018 showed normal LV systolic function and mild left atrial enlargement. Cardiac catheterization June 2018 showed a 70% D1, 90 mid LAD, 90% proximal right coronary artery followed by a 100% lesion and occluded circumflex. Ejection fraction 50-55%. Carotid Dopplers June 2018 with no significant stenosis. Patient subsequently underwent coronary artery bypassing graft with a LIMA  The LAD, saphenous pain graft to the diagonal, saphenous vein graft to the acute marginal and saphenous vein graft to the PDA. Since last seen, the patient denies any dyspnea on exertion, orthopnea, PND, pedal edema, palpitations, syncope or chest pain.   Current Outpatient Medications  Medication Sig Dispense Refill  . aspirin EC 325 MG EC tablet Take 1 tablet (325 mg total) by mouth daily. 30 tablet 0  . atorvastatin (LIPITOR) 80 MG tablet Take 1 tablet (80 mg total) by mouth daily at 6 PM. 30 tablet 12  . metoprolol tartrate (LOPRESSOR) 25 MG tablet Take 1 tablet (25 mg total) by mouth 2 (two) times daily. 60 tablet 12   No current facility-administered medications for this visit.      Past Medical History:  Diagnosis Date  . Basal cell carcinoma of nasal tip 08/24/2012  . Coronary artery disease    a. Abnl stress test -> s/p CABGx4 in 04/2017 with LIMA-LAD, SVG-diag, SVG-AM, SVG-PDA.  Marland Kitchen Hyperlipidemia   . Hypertension   . Male hypogonadism 08/24/2012  . Onychomycosis 08/24/2012    Past Surgical History:  Procedure Laterality Date  . CARDIAC CATHETERIZATION  05/08/2017  . CARDIOVASCULAR STRESS TEST  04/2017  . CORONARY ARTERY BYPASS GRAFT N/A 05/09/2017   Procedure: CORONARY ARTERY BYPASS GRAFTING times four using left internal mammary artery and right leg saphenous vein  ;  Surgeon: Gaye Pollack, MD;  Location: [facility];  Service: Open Heart Surgery;  Laterality: N/A;  . LEFT HEART CATH AND CORONARY ANGIOGRAPHY N/A 05/08/2017   Procedure: Left Heart Cath and Coronary  Angiography;  Surgeon: Troy Sine, MD;  Location: [facility];  Service: Cardiovascular;  Laterality: N/A;  . TEE WITHOUT CARDIOVERSION N/A 05/09/2017   Procedure: TRANSESOPHAGEAL ECHOCARDIOGRAM (TEE);  Surgeon: Gaye Pollack, MD;  Location: [facility];  Service: Open Heart Surgery;  Laterality: N/A;  . TONSILLECTOMY  1968    Social History   Socioeconomic History  . Marital status: Married    Spouse name: Not on file  . Number of children: Not on file  . Years of education: Not on file  . Highest education level: Not on file  Occupational History    Comment: Unemployed  Social Needs  . Financial resource strain: Not on file  . Food insecurity:    Worry: Not on file    Inability: Not on file  . Transportation needs:    Medical: Not on file    Non-medical: Not on file  Tobacco Use  . Smoking status: Never Smoker  . Smokeless tobacco: Never Used  Substance and Sexual Activity  . Alcohol use: Yes    Comment: Occasional  . Drug use: No  . Sexual activity: Not on file  Lifestyle  . Physical activity:    Days per week: Not on file    Minutes per session: Not on file  . Stress: Not on file  Relationships  . Social connections:    Talks on phone: Not on file    Gets together: Not on file    Attends religious service: Not on file  Active member of club or organization: Not on file    Attends meetings of clubs or organizations: Not on file    Relationship status: Not on file  . Intimate partner violence:    Fear of current or ex partner: Not on file    Emotionally abused: Not on file    Physically abused: Not on file    Forced sexual activity: Not on file  Other Topics Concern  . Not on file  Social History Narrative  . Not on file    Family History  Problem Relation Age of Onset  . CAD Father        Died of MI at age 51  . Cancer Mother   . Colon cancer Neg Hx   . Stomach cancer Neg Hx     ROS: no fevers or chills, productive cough, hemoptysis,  dysphasia, odynophagia, melena, hematochezia, dysuria, hematuria, rash, seizure activity, orthopnea, PND, pedal edema, claudication. Remaining systems are negative.  Physical Exam: Well-developed well-nourished in no acute distress.  Skin is warm and dry.  HEENT is normal.  Neck is supple.  Chest is clear to auscultation with normal expansion.  Cardiovascular exam is regular rate and rhythm.  Abdominal exam nontender or distended. No masses palpated. Extremities show no edema. neuro grossly intact  ECG-sinus bradycardia at a rate of 54.  Prior inferior infarct.  Personally reviewed  A/P  1 coronary artery disease status post coronary artery bypass and graft-patient doing well with no chest pain.  Plan to continue medical therapy with aspirin and statin.  Continue lifestyle modification.  2 hyperlipidemia-continue statin at present dose.  3 hypertension-blood pressure is controlled.  Continue present medications.  Kirk Ruths, MD

## 2018-05-02 ENCOUNTER — Encounter: Payer: Self-pay | Admitting: Cardiology

## 2018-05-02 ENCOUNTER — Ambulatory Visit (INDEPENDENT_AMBULATORY_CARE_PROVIDER_SITE_OTHER): Payer: BLUE CROSS/BLUE SHIELD | Admitting: Cardiology

## 2018-05-02 VITALS — BP 118/80 | HR 54 | Ht 68.0 in | Wt 201.4 lb

## 2018-05-02 DIAGNOSIS — I2510 Atherosclerotic heart disease of native coronary artery without angina pectoris: Secondary | ICD-10-CM | POA: Diagnosis not present

## 2018-05-02 DIAGNOSIS — I10 Essential (primary) hypertension: Secondary | ICD-10-CM

## 2018-05-02 DIAGNOSIS — E7800 Pure hypercholesterolemia, unspecified: Secondary | ICD-10-CM

## 2018-05-02 MED ORDER — ASPIRIN EC 81 MG PO TBEC: 81.0000 mg | DELAYED_RELEASE_TABLET | Freq: Every day | ORAL | 3 refills | Status: AC

## 2018-05-02 NOTE — Patient Instructions (Signed)
Medication Instructions:   DECREASE ASPIRIN TO 81 MG ONCE DAILY  Follow-Up:  Your physician wants you to follow-up in: [place] will receive a reminder letter in the mail two months in advance. If you don't receive a letter, please call our office to schedule the follow-up appointment.   If you need a refill on your cardiac medications before your next appointment, please call your pharmacy.   2

## 2018-05-22 IMAGING — CR DG CHEST 2V
2 series · 2 of 2 positions shown · non-contrast
Comparison: 05/11/2017 and earlier.

CLINICAL DATA: 60-year-old male status post CABG in [REDACTED].

EXAM:
CHEST  2 VIEW

[w chest pa]
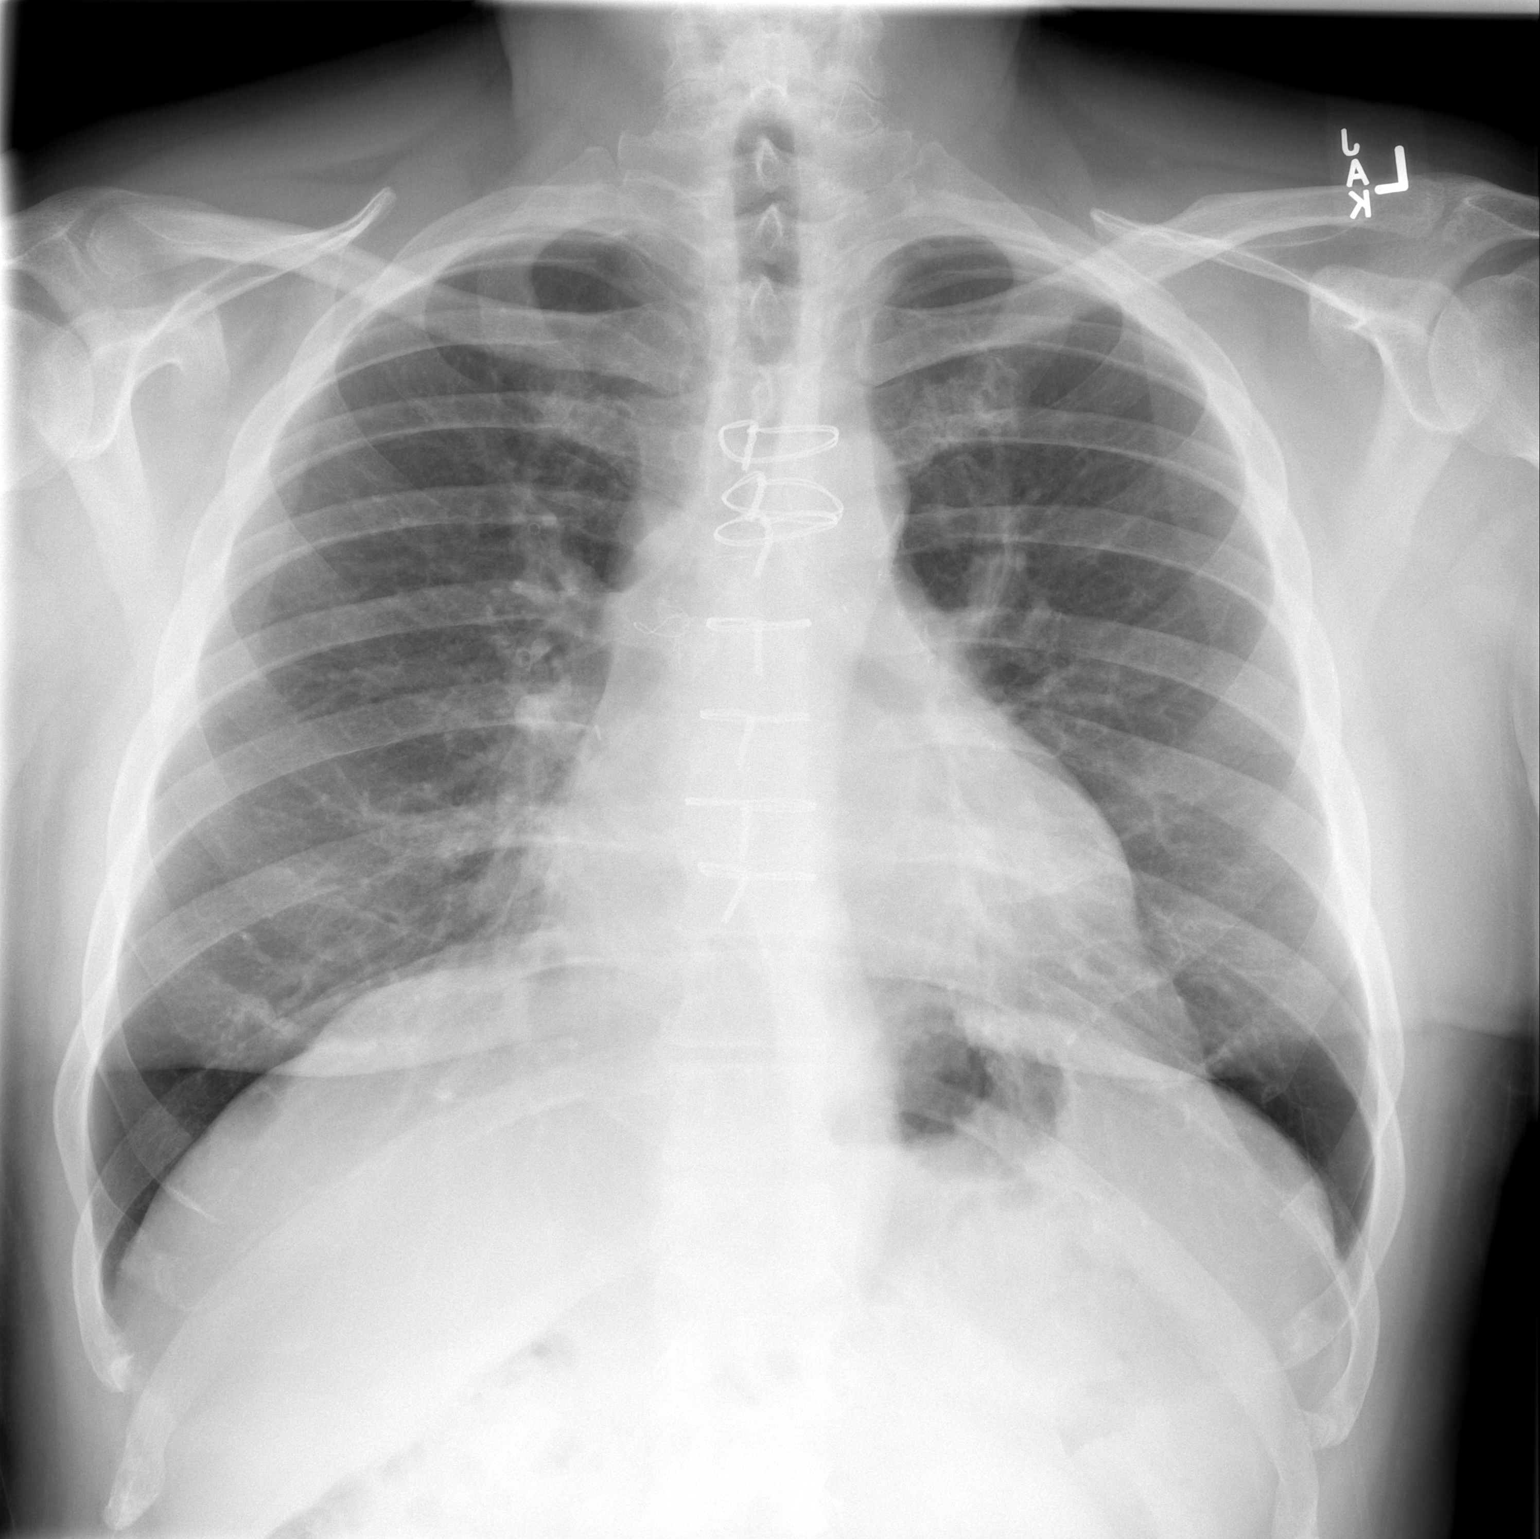

[w chest lat]
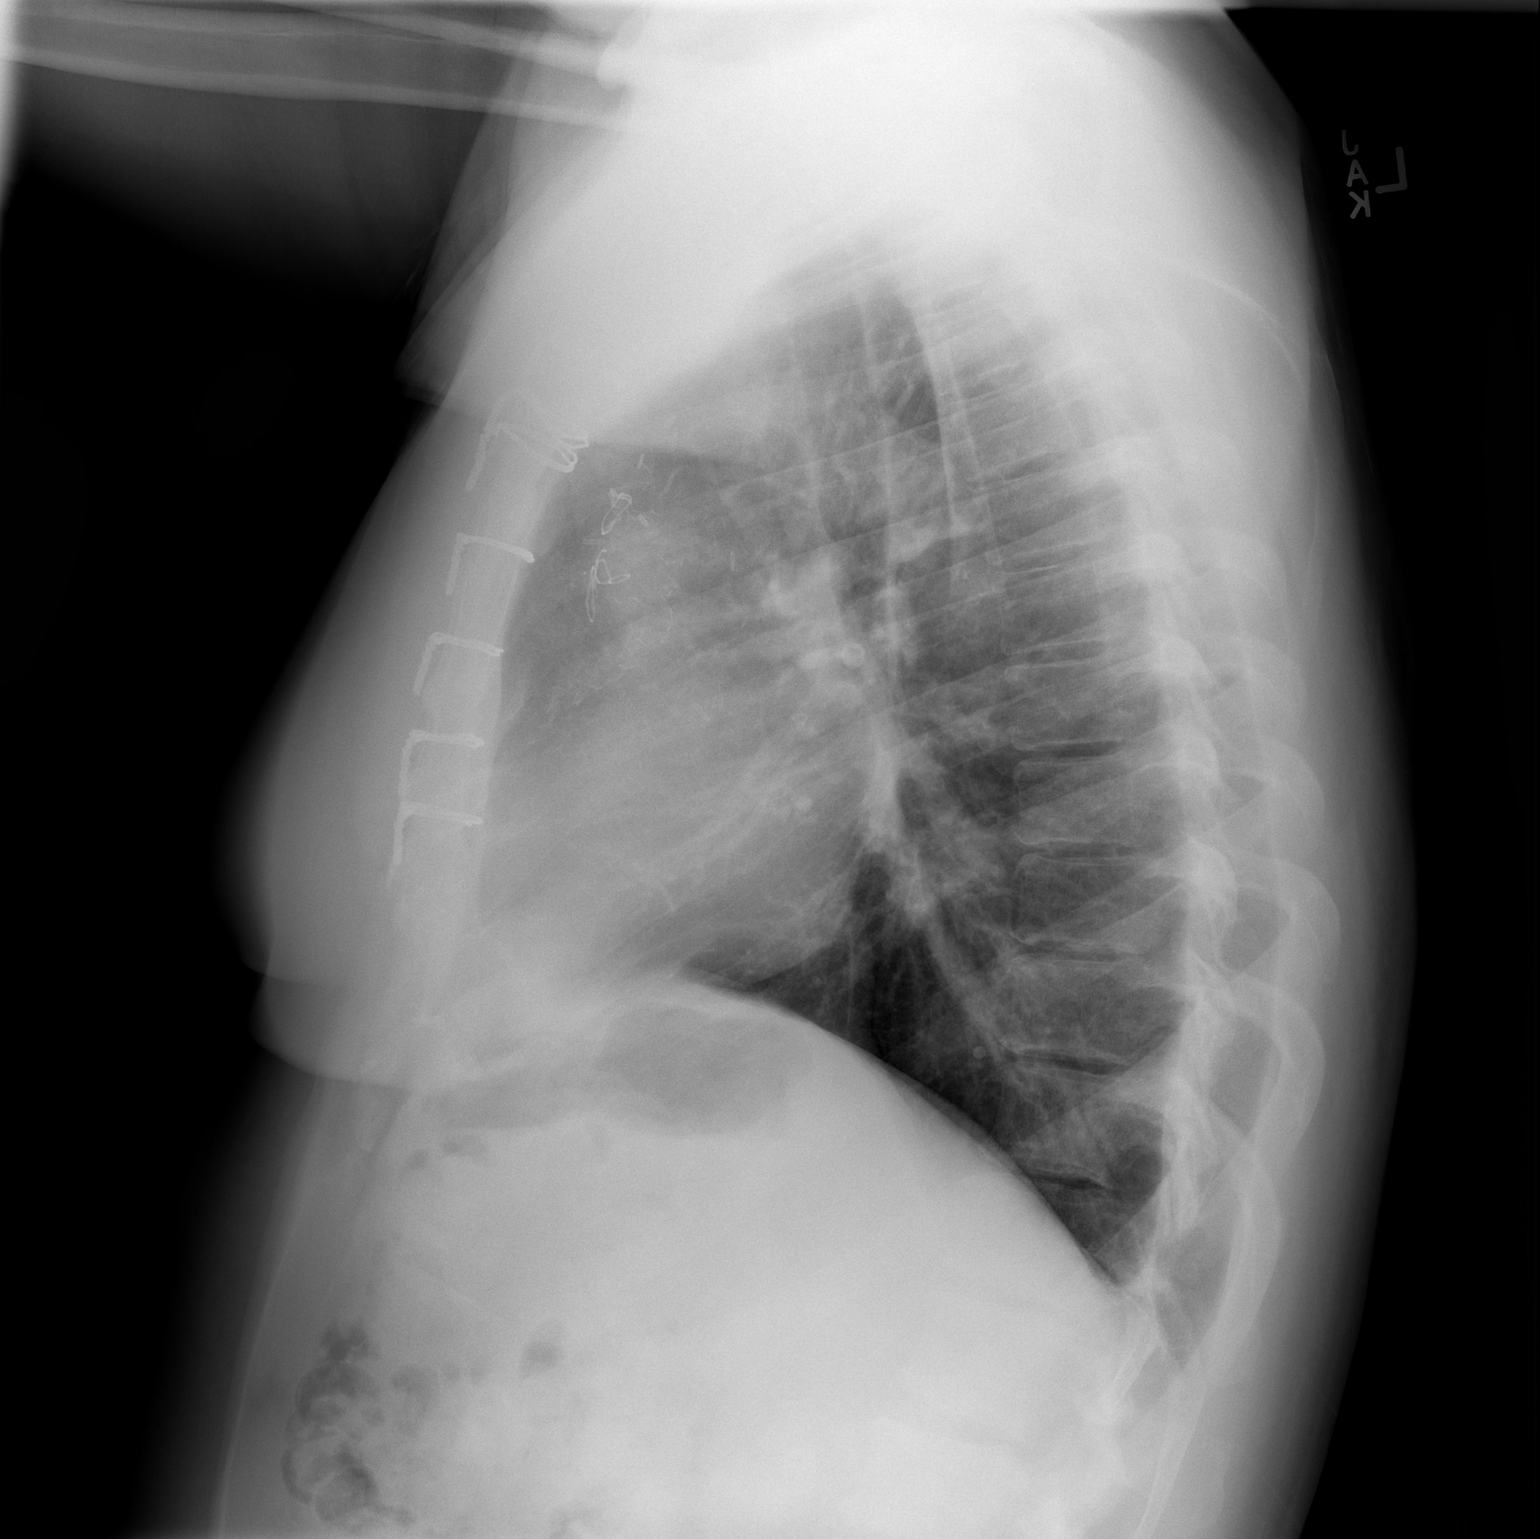

[2 of 2 positions shown; findings below may reference images not displayed]

FINDINGS: Right IJ introducer sheath removed since the prior portable film.
Sequelae of sternotomy and CABG. Stable lung volumes compared to the
preoperative films. Mediastinal contours are stable. Visualized
tracheal air column is within normal limits.

No pneumothorax or pulmonary edema.  No confluent pulmonary opacity.

Minimal blunting of both posterior costophrenic angles.

Negative visible bowel gas pattern. No acute osseous abnormality
identified.
IMPRESSION: Trace if any residual pleural effusions. No other acute
cardiopulmonary abnormality.

## 2018-07-03 ENCOUNTER — Other Ambulatory Visit: Payer: Self-pay | Admitting: Cardiology

## 2018-11-20 DIAGNOSIS — M47817 Spondylosis without myelopathy or radiculopathy, lumbosacral region: Secondary | ICD-10-CM | POA: Diagnosis not present

## 2018-11-20 DIAGNOSIS — M5136 Other intervertebral disc degeneration, lumbar region: Secondary | ICD-10-CM | POA: Diagnosis not present

## 2018-11-20 DIAGNOSIS — M160 Bilateral primary osteoarthritis of hip: Secondary | ICD-10-CM | POA: Diagnosis not present

## 2018-11-22 ENCOUNTER — Encounter: Payer: BLUE CROSS/BLUE SHIELD | Admitting: Sports Medicine

## 2018-11-26 ENCOUNTER — Encounter: Payer: Self-pay | Admitting: Sports Medicine

## 2018-11-26 ENCOUNTER — Ambulatory Visit (INDEPENDENT_AMBULATORY_CARE_PROVIDER_SITE_OTHER): Payer: BLUE CROSS/BLUE SHIELD | Admitting: Sports Medicine

## 2018-11-26 VITALS — BP 113/67 | HR 52 | Ht 68.0 in | Wt 214.0 lb

## 2018-11-26 DIAGNOSIS — M5136 Other intervertebral disc degeneration, lumbar region with discogenic back pain only: Secondary | ICD-10-CM

## 2018-11-26 DIAGNOSIS — Z0000 Encounter for general adult medical examination without abnormal findings: Secondary | ICD-10-CM

## 2018-11-26 DIAGNOSIS — N139 Obstructive and reflux uropathy, unspecified: Secondary | ICD-10-CM

## 2018-11-26 DIAGNOSIS — N63 Unspecified lump in unspecified breast: Secondary | ICD-10-CM

## 2018-11-26 MED ORDER — TAMSULOSIN HCL 0.4 MG PO CAPS: 0.4000 mg | ORAL_CAPSULE | Freq: Every day | ORAL | 3 refills | Status: DC

## 2018-11-26 MED ORDER — PREDNISONE 50 MG PO TABS: ORAL_TABLET | ORAL | 0 refills | Status: DC

## 2018-11-26 NOTE — Assessment & Plan Note (Signed)
Axial discogenic pain. He is seeing a chiropractor without any improvement. Adding 5 days of prednisone, home rehab exercises, if no better we will proceed with MRI for interventional planning.

## 2018-11-26 NOTE — Assessment & Plan Note (Signed)
Routine physical as above. Checking routine labs. Checking PSA. He does have a few skin lesions, there do appear to be some early skin cancers above his left eyebrow, right cheek.  He has a seborrheic keratosis on the right side of his preauricular region, he does have an appointment coming up with dermatology for this.

## 2018-11-26 NOTE — Assessment & Plan Note (Signed)
Left breast mass, adding breast ultrasound and mammogram. He does have a family history, male and male of breast cancer.

## 2018-11-26 NOTE — Assessment & Plan Note (Signed)
Persistent obstructive uropathy symptoms, never took the Flomax I prescribed last year. We will try this again.

## 2018-11-26 NOTE — Progress Notes (Signed)
Subjective:    CC: Annual physical  HPI:  Dominic Ray returns, he is a pleasant 64 year old male here for his physical, he has a few complaints.  Obstructive uropathy: We diagnosed this and I prescribed Flomax a year ago, he never took it.  He has occasional urgency, frequency, dribbling.  Weak stream.  He also has some low back pain.  He had a recent fall, was seen by a chiropractor, x-rays were obtained that showed multilevel spondylosis without anything acute.  Persistent discomfort.  He is agreeable to try allopathic treatment now.  Breast asymmetry.  Present for some time now, feeling a mass under his left breast.  He does have a family history of breast cancer in both male and male family members.  I reviewed the past medical history, family history, social history, surgical history, and allergies today and no changes were needed.  Please see the problem list section below in epic for further details.  Past Medical History: Past Medical History:  Diagnosis Date  . Basal cell carcinoma of nasal tip 08/24/2012  . Coronary artery disease    a. Abnl stress test -> s/p CABGx4 in 04/2017 with LIMA-LAD, SVG-diag, SVG-AM, SVG-PDA.  Marland Kitchen Hyperlipidemia   . Hypertension   . Male hypogonadism 08/24/2012  . Onychomycosis 08/24/2012   Past Surgical History: Past Surgical History:  Procedure Laterality Date  . CARDIAC CATHETERIZATION  05/08/2017  . CARDIOVASCULAR STRESS TEST  04/2017  . CORONARY ARTERY BYPASS GRAFT N/A 05/09/2017   Procedure: CORONARY ARTERY BYPASS GRAFTING times four using left internal mammary artery and right leg saphenous vein  ;  Surgeon: Gaye Pollack, MD;  Location: [facility];  Service: Open Heart Surgery;  Laterality: N/A;  . LEFT HEART CATH AND CORONARY ANGIOGRAPHY N/A 05/08/2017   Procedure: Left Heart Cath and Coronary Angiography;  Surgeon: Troy Sine, MD;  Location: [facility];  Service: Cardiovascular;  Laterality: N/A;  . TEE WITHOUT CARDIOVERSION N/A 05/09/2017     Procedure: TRANSESOPHAGEAL ECHOCARDIOGRAM (TEE);  Surgeon: Gaye Pollack, MD;  Location: [facility];  Service: Open Heart Surgery;  Laterality: N/A;  . TONSILLECTOMY  1968   Social History: Social History   Socioeconomic History  . Marital status: Married    Spouse name: Not on file  . Number of children: Not on file  . Years of education: Not on file  . Highest education level: Not on file  Occupational History    Comment: Unemployed  Social Needs  . Financial resource strain: Not on file  . Food insecurity:    Worry: Not on file    Inability: Not on file  . Transportation needs:    Medical: Not on file    Non-medical: Not on file  Tobacco Use  . Smoking status: Never Smoker  . Smokeless tobacco: Never Used  Substance and Sexual Activity  . Alcohol use: Yes    Comment: Occasional  . Drug use: No  . Sexual activity: Not on file  Lifestyle  . Physical activity:    Days per week: Not on file    Minutes per session: Not on file  . Stress: Not on file  Relationships  . Social connections:    Talks on phone: Not on file    Gets together: Not on file    Attends religious service: Not on file    Active member of club or organization: Not on file    Attends meetings of clubs or organizations: Not on file  Relationship status: Not on file  Other Topics Concern  . Not on file  Social History Narrative  . Not on file   Family History: Family History  Problem Relation Age of Onset  . CAD Father        Died of MI at age 36  . Cancer Mother   . Colon cancer Neg Hx   . Stomach cancer Neg Hx    Allergies: No Known Allergies Medications: See med rec.  Review of Systems: No headache, visual changes, nausea, vomiting, diarrhea, constipation, dizziness, abdominal pain, skin rash, fevers, chills, night sweats, swollen lymph nodes, weight loss, chest pain, body aches, joint swelling, muscle aches, shortness of breath, mood changes, visual or auditory  hallucinations.  Objective:    General: Well Developed, well nourished, and in no acute distress.  Neuro: Alert and oriented x3, extra-ocular muscles intact, sensation grossly intact. Cranial nerves II through XII are intact, motor, sensory, and coordinative functions are all intact. HEENT: Normocephalic, atraumatic, pupils equal round reactive to light, neck supple, no masses, no lymphadenopathy, thyroid nonpalpable. Oropharynx, nasopharynx, external ear canals are unremarkable. Skin: Warm and dry, no rashes noted.  Cardiac: Regular rate and rhythm, no murmurs rubs or gallops.  Respiratory: Clear to auscultation bilaterally. Not using accessory muscles, speaking in full sentences.  Abdominal: Soft, nontender, nondistended, positive bowel sounds, no masses, no organomegaly.  Musculoskeletal: Shoulder, elbow, wrist, hip, knee, ankle stable, and with full range of motion. Chest wall: Fullness in the left breast, question palpable mass.  Impression and Recommendations:    The patient was counselled, risk factors were discussed, anticipatory guidance given.  Annual physical exam Routine physical as above. Checking routine labs. Checking PSA. He does have a few skin lesions, there do appear to be some early skin cancers above his left eyebrow, right cheek.  He has a seborrheic keratosis on the right side of his preauricular region, he does have an appointment coming up with dermatology for this.  Obstructive uropathy Persistent obstructive uropathy symptoms, never took the Flomax I prescribed last year. We will try this again.  Discogenic low back pain Axial discogenic pain. He is seeing a chiropractor without any improvement. Adding 5 days of prednisone, home rehab exercises, if no better we will proceed with MRI for interventional planning.  Breast mass in male Left breast mass, adding breast ultrasound and mammogram. He does have a family history, male and male of breast  cancer.  ___________________________________________ Gwen Her. Dianah Field, M.D., ABFM., CAQSM. Primary Care and Sports Medicine [facility]  Adjunct Professor of [employer] [facility] [employer]

## 2018-11-27 DIAGNOSIS — Z0000 Encounter for general adult medical examination without abnormal findings: Secondary | ICD-10-CM | POA: Diagnosis not present

## 2018-11-27 DIAGNOSIS — R7989 Other specified abnormal findings of blood chemistry: Secondary | ICD-10-CM | POA: Diagnosis not present

## 2018-11-28 LAB — COMPREHENSIVE METABOLIC PANEL
AG Ratio: 1.4 (calc) (ref 1.0–2.5)
ALT: 20 U/L (ref 9–46)
AST: 19 U/L (ref 10–35)
Albumin: 3.8 g/dL (ref 3.6–5.1)
Alkaline phosphatase (APISO): 73 U/L (ref 40–115)
BUN: 17 mg/dL (ref 7–25)
CO2: 29 mmol/L (ref 20–32)
Calcium: 8.8 mg/dL (ref 8.6–10.3)
Chloride: 103 mmol/L (ref 98–110)
Creat: 1.07 mg/dL (ref 0.70–1.25)
Globulin: 2.8 g/dL (calc) (ref 1.9–3.7)
Glucose, Bld: 84 mg/dL (ref 65–99)
Potassium: 4.6 mmol/L (ref 3.5–5.3)
Sodium: 139 mmol/L (ref 135–146)
Total Bilirubin: 0.7 mg/dL (ref 0.2–1.2)
Total Protein: 6.6 g/dL (ref 6.1–8.1)

## 2018-11-28 LAB — LIPID PANEL W/REFLEX DIRECT LDL
Cholesterol: 111 mg/dL (ref <200)
HDL: 37 mg/dL — ABNORMAL LOW (ref >40)
LDL Cholesterol (Calc): 57 mg/dL (calc)
Non-HDL Cholesterol (Calc): 74 mg/dL (calc) (ref <130)
Total CHOL/HDL Ratio: 3 (calc) (ref <5.0)
Triglycerides: 90 mg/dL (ref <150)

## 2018-11-28 LAB — CBC
HCT: 46.4 % (ref 38.5–50.0)
Hemoglobin: 15.3 g/dL (ref 13.2–17.1)
MCH: 28.5 pg (ref 27.0–33.0)
MCHC: 33 g/dL (ref 32.0–36.0)
MCV: 86.4 fL (ref 80.0–100.0)
MPV: 11.3 fL (ref 7.5–12.5)
Platelets: 234 10*3/uL (ref 140–400)
RBC: 5.37 10*6/uL (ref 4.20–5.80)
RDW: 13.1 % (ref 11.0–15.0)
WBC: 7.1 10*3/uL (ref 3.8–10.8)

## 2018-11-28 LAB — HEMOGLOBIN A1C
Hgb A1c MFr Bld: 5.4 % of total Hgb (ref <5.7)
Mean Plasma Glucose: 108 (calc)
eAG (mmol/L): 6 (calc)

## 2018-11-28 LAB — PSA, TOTAL AND FREE
PSA, % Free: 17 % (calc) — ABNORMAL LOW (ref >25)
PSA, Free: 0.2 ng/mL
PSA, Total: 1.2 ng/mL (ref <=4.0)

## 2018-11-28 LAB — TSH: TSH: 1.55 mIU/L (ref 0.40–4.50)

## 2018-12-03 DIAGNOSIS — L570 Actinic keratosis: Secondary | ICD-10-CM | POA: Diagnosis not present

## 2018-12-03 DIAGNOSIS — L820 Inflamed seborrheic keratosis: Secondary | ICD-10-CM | POA: Diagnosis not present

## 2018-12-03 DIAGNOSIS — C44329 Squamous cell carcinoma of skin of other parts of face: Secondary | ICD-10-CM | POA: Diagnosis not present

## 2018-12-03 DIAGNOSIS — D485 Neoplasm of uncertain behavior of skin: Secondary | ICD-10-CM | POA: Diagnosis not present

## 2018-12-04 ENCOUNTER — Other Ambulatory Visit: Payer: Self-pay | Admitting: Sports Medicine

## 2018-12-04 DIAGNOSIS — N63 Unspecified lump in unspecified breast: Secondary | ICD-10-CM

## 2018-12-20 DIAGNOSIS — D485 Neoplasm of uncertain behavior of skin: Secondary | ICD-10-CM | POA: Diagnosis not present

## 2018-12-20 DIAGNOSIS — C44329 Squamous cell carcinoma of skin of other parts of face: Secondary | ICD-10-CM | POA: Diagnosis not present

## 2019-04-26 DIAGNOSIS — M25561 Pain in right knee: Secondary | ICD-10-CM | POA: Diagnosis not present

## 2019-06-03 NOTE — Progress Notes (Signed)
HPI: FU CAD. Echocardiogram May 2018 showed normal LV systolic function and mild left atrial enlargement. Cardiac catheterization June 2018 showed a 70% D1, 90 mid LAD, 90% proximal right coronary artery followed by a 100% lesion and occluded circumflex. Ejection fraction 50-55%. Carotid Dopplers June 2018 with no significant stenosis. Patient subsequently underwent coronary artery bypassing graft with a LIMA The LAD, saphenous pain graft to the diagonal, saphenous vein graft to the acute marginal and saphenous vein graft to the PDA. Since last seen, the patient has dyspnea with more extreme activities but not with routine activities. It is relieved with rest. It is not associated with chest pain. There is no orthopnea, PND or pedal edema. There is no syncope or palpitations. There is no exertional chest pain.    Current Outpatient Medications  Medication Sig Dispense Refill  . aspirin EC 81 MG tablet Take 1 tablet (81 mg total) by mouth daily. 90 tablet 3  . atorvastatin (LIPITOR) 80 MG tablet TAKE 1 TABLET (80 MG TOTAL) BY MOUTH DAILY AT 6 PM. 90 tablet 4  . metoprolol tartrate (LOPRESSOR) 25 MG tablet TAKE 1 TABLET BY MOUTH TWICE A DAY 180 tablet 4  . predniSONE (DELTASONE) 50 MG tablet One tab PO daily for 5 days. (Patient not taking: Reported on 06/12/2019) 5 tablet 0  . tamsulosin (FLOMAX) 0.4 MG CAPS capsule Take 1 capsule (0.4 mg total) by mouth daily after breakfast. (Patient not taking: Reported on 06/12/2019) 30 capsule 3   No current facility-administered medications for this visit.      Past Medical History:  Diagnosis Date  . Basal cell carcinoma of nasal tip 08/24/2012  . Coronary artery disease    a. Abnl stress test -> s/p CABGx4 in 04/2017 with LIMA-LAD, SVG-diag, SVG-AM, SVG-PDA.  Marland Kitchen Hyperlipidemia   . Hypertension   . Male hypogonadism 08/24/2012  . Onychomycosis 08/24/2012    Past Surgical History:  Procedure Laterality Date  . CARDIAC CATHETERIZATION   05/08/2017  . CARDIOVASCULAR STRESS TEST  04/2017  . CORONARY ARTERY BYPASS GRAFT N/A 05/09/2017   Procedure: CORONARY ARTERY BYPASS GRAFTING times four using left internal mammary artery and right leg saphenous vein  ;  Surgeon: Gaye Pollack, MD;  Location: [facility];  Service: Open Heart Surgery;  Laterality: N/A;  . LEFT HEART CATH AND CORONARY ANGIOGRAPHY N/A 05/08/2017   Procedure: Left Heart Cath and Coronary Angiography;  Surgeon: Troy Sine, MD;  Location: [facility];  Service: Cardiovascular;  Laterality: N/A;  . TEE WITHOUT CARDIOVERSION N/A 05/09/2017   Procedure: TRANSESOPHAGEAL ECHOCARDIOGRAM (TEE);  Surgeon: Gaye Pollack, MD;  Location: [facility];  Service: Open Heart Surgery;  Laterality: N/A;  . TONSILLECTOMY  1968    Social History   Socioeconomic History  . Marital status: Married    Spouse name: Not on file  . Number of children: Not on file  . Years of education: Not on file  . Highest education level: Not on file  Occupational History    Comment: Unemployed  Social Needs  . Financial resource strain: Not on file  . Food insecurity    Worry: Not on file    Inability: Not on file  . Transportation needs    Medical: Not on file    Non-medical: Not on file  Tobacco Use  . Smoking status: Never Smoker  . Smokeless tobacco: Never Used  Substance and Sexual Activity  . Alcohol use: Yes    Comment: Occasional  .  Drug use: No  . Sexual activity: Not on file  Lifestyle  . Physical activity    Days per week: Not on file    Minutes per session: Not on file  . Stress: Not on file  Relationships  . Social Herbalist on phone: Not on file    Gets together: Not on file    Attends religious service: Not on file    Active member of club or organization: Not on file    Attends meetings of clubs or organizations: Not on file    Relationship status: Not on file  . Intimate partner violence    Fear of current or ex partner: Not on file     Emotionally abused: Not on file    Physically abused: Not on file    Forced sexual activity: Not on file  Other Topics Concern  . Not on file  Social History Narrative  . Not on file    Family History  Problem Relation Age of Onset  . CAD Father        Died of MI at age 73  . Cancer Mother   . Colon cancer Neg Hx   . Stomach cancer Neg Hx     ROS: no fevers or chills, productive cough, hemoptysis, dysphasia, odynophagia, melena, hematochezia, dysuria, hematuria, rash, seizure activity, orthopnea, PND, pedal edema, claudication. Remaining systems are negative.  Physical Exam: Well-developed well-nourished in no acute distress.  Skin is warm and dry.  HEENT is normal.  Neck is supple.  Chest is clear to auscultation with normal expansion.  Cardiovascular exam is regular rate and rhythm.  1/6 systolic ejection murmur Abdominal exam nontender or distended. No masses palpated. Extremities show no edema. neuro grossly intact  ECG-sinus bradycardia at a rate of 56, inferior infarct.  Personally reviewed  A/P  1 coronary artery disease status post coronary artery bypass graft-patient denies recurrent chest pain.  Continue medical therapy with aspirin and statin.  2 hypertension-patient's blood pressure is controlled.  Continue present medications and follow.  3 Hyperlipidemia-continue statin.  Kirk Ruths, MD

## 2019-06-12 ENCOUNTER — Ambulatory Visit (INDEPENDENT_AMBULATORY_CARE_PROVIDER_SITE_OTHER): Payer: BC Managed Care – PPO | Admitting: Cardiology

## 2019-06-12 ENCOUNTER — Encounter: Payer: Self-pay | Admitting: Cardiology

## 2019-06-12 ENCOUNTER — Other Ambulatory Visit: Payer: Self-pay

## 2019-06-12 VITALS — BP 118/72 | HR 56 | Ht 68.0 in | Wt 215.1 lb

## 2019-06-12 DIAGNOSIS — I2510 Atherosclerotic heart disease of native coronary artery without angina pectoris: Secondary | ICD-10-CM

## 2019-06-12 DIAGNOSIS — I10 Essential (primary) hypertension: Secondary | ICD-10-CM | POA: Diagnosis not present

## 2019-06-12 DIAGNOSIS — E7800 Pure hypercholesterolemia, unspecified: Secondary | ICD-10-CM

## 2019-06-12 NOTE — Patient Instructions (Signed)
Medication Instructions:  NO CHANGE If you need a refill on your cardiac medications before your next appointment, please call your pharmacy.   Lab work: If you have labs (blood work) drawn today and your tests are completely normal, you will receive your results only by: Marland Kitchen MyChart Message (if you have MyChart) OR . A paper copy in the mail If you have any lab test that is abnormal or we need to change your treatment, we will call you to review the results.  Follow-Up: At [facility], you and your health needs are our priority.  As part of our continuing mission to provide you with exceptional heart care, we have created designated Provider Care Teams.  These Care Teams include your primary Cardiologist (physician) and Advanced Practice Providers (APPs -  Physician Assistants and Nurse Practitioners) who all work together to provide you with the care you need, when you need it. . Your physician recommends that you schedule a follow-up appointment in: [place]

## 2019-07-29 ENCOUNTER — Telehealth: Payer: Self-pay | Admitting: Cardiology

## 2019-07-29 ENCOUNTER — Other Ambulatory Visit: Payer: Self-pay

## 2019-07-29 MED ORDER — METOPROLOL TARTRATE 25 MG PO TABS: 25.0000 mg | ORAL_TABLET | Freq: Two times a day (BID) | ORAL | 0 refills | Status: DC

## 2019-07-29 MED ORDER — ATORVASTATIN CALCIUM 80 MG PO TABS: 80.0000 mg | ORAL_TABLET | Freq: Every day | ORAL | 0 refills | Status: DC

## 2019-07-29 NOTE — Telephone Encounter (Signed)
°*  STAT* If patient is at the pharmacy, call can be transferred to refill team.   1. Which medications need to be refilled? (please list name of each medication and dose if known) Metoprolol tartrate, Atorvastatin  2. Which pharmacy/location (including street and city if local pharmacy) is medication to be sent to? [facility] [address]  3. Do they need a 30 day or 90 day supply? [place]

## 2019-10-29 ENCOUNTER — Other Ambulatory Visit: Payer: Self-pay | Admitting: Cardiovascular Disease

## 2019-11-01 ENCOUNTER — Other Ambulatory Visit: Payer: Self-pay

## 2019-11-01 ENCOUNTER — Other Ambulatory Visit: Payer: Self-pay | Admitting: Cardiovascular Disease

## 2019-11-01 DIAGNOSIS — N139 Obstructive and reflux uropathy, unspecified: Secondary | ICD-10-CM

## 2019-11-01 MED ORDER — TAMSULOSIN HCL 0.4 MG PO CAPS: 0.4000 mg | ORAL_CAPSULE | Freq: Every day | ORAL | 3 refills | Status: DC

## 2019-11-01 NOTE — Telephone Encounter (Signed)
Patient called wanting refill on Flomax. Looks like he was given RX 11/26/18 for #30 with 3 RF.  Last OV 11/26/18  RX pended

## 2019-12-02 ENCOUNTER — Other Ambulatory Visit: Payer: Self-pay

## 2019-12-02 ENCOUNTER — Ambulatory Visit (INDEPENDENT_AMBULATORY_CARE_PROVIDER_SITE_OTHER): Payer: BC Managed Care – PPO | Admitting: Sports Medicine

## 2019-12-02 ENCOUNTER — Encounter: Payer: Self-pay | Admitting: Sports Medicine

## 2019-12-02 DIAGNOSIS — N139 Obstructive and reflux uropathy, unspecified: Secondary | ICD-10-CM | POA: Diagnosis not present

## 2019-12-02 DIAGNOSIS — E7800 Pure hypercholesterolemia, unspecified: Secondary | ICD-10-CM

## 2019-12-02 DIAGNOSIS — I10 Essential (primary) hypertension: Secondary | ICD-10-CM | POA: Diagnosis not present

## 2019-12-02 DIAGNOSIS — Z951 Presence of aortocoronary bypass graft: Secondary | ICD-10-CM

## 2019-12-02 DIAGNOSIS — Z0000 Encounter for general adult medical examination without abnormal findings: Secondary | ICD-10-CM | POA: Diagnosis not present

## 2019-12-02 DIAGNOSIS — L989 Disorder of the skin and subcutaneous tissue, unspecified: Secondary | ICD-10-CM

## 2019-12-02 NOTE — Assessment & Plan Note (Signed)
Seborrheic keratoses under left eye and at right cubital fossa. There do appear to be skin cancers under his right eye and over his left hand dorsally. He will return for shave excision of the left hand and right facial lesions.

## 2019-12-02 NOTE — Assessment & Plan Note (Signed)
Starting to note slight improvements with Flomax, he is only been taking it for about 3 weeks. If insufficient symptom relief after a full month he will go up to twice daily.

## 2019-12-02 NOTE — Progress Notes (Signed)
Subjective:    CC: Annual Physical Exam  HPI:  This patient is here for their annual physical  I reviewed the past medical history, family history, social history, surgical history, and allergies today and no changes were needed.  Please see the problem list section below in epic for further details.  Past Medical History: Past Medical History:  Diagnosis Date  . Basal cell carcinoma of nasal tip 08/24/2012  . Coronary artery disease    a. Abnl stress test -> s/p CABGx4 in 04/2017 with LIMA-LAD, SVG-diag, SVG-AM, SVG-PDA.  Marland Kitchen Hyperlipidemia   . Hypertension   . Male hypogonadism 08/24/2012  . Onychomycosis 08/24/2012   Past Surgical History: Past Surgical History:  Procedure Laterality Date  . CARDIAC CATHETERIZATION  05/08/2017  . CARDIOVASCULAR STRESS TEST  04/2017  . CORONARY ARTERY BYPASS GRAFT N/A 05/09/2017   Procedure: CORONARY ARTERY BYPASS GRAFTING times four using left internal mammary artery and right leg saphenous vein  ;  Surgeon: Gaye Pollack, MD;  Location: [facility];  Service: Open Heart Surgery;  Laterality: N/A;  . LEFT HEART CATH AND CORONARY ANGIOGRAPHY N/A 05/08/2017   Procedure: Left Heart Cath and Coronary Angiography;  Surgeon: Troy Sine, MD;  Location: [facility];  Service: Cardiovascular;  Laterality: N/A;  . TEE WITHOUT CARDIOVERSION N/A 05/09/2017   Procedure: TRANSESOPHAGEAL ECHOCARDIOGRAM (TEE);  Surgeon: Gaye Pollack, MD;  Location: [facility];  Service: Open Heart Surgery;  Laterality: N/A;  . TONSILLECTOMY  1968   Social History: Social History   Socioeconomic History  . Marital status: Married    Spouse name: Not on file  . Number of children: Not on file  . Years of education: Not on file  . Highest education level: Not on file  Occupational History    Comment: Unemployed  Tobacco Use  . Smoking status: Never Smoker  . Smokeless tobacco: Never Used  Substance and Sexual Activity  . Alcohol use: Yes    Comment: Occasional  .  Drug use: No  . Sexual activity: Not on file  Other Topics Concern  . Not on file  Social History Narrative  . Not on file   Social Determinants of Health   Financial Resource Strain:   . Difficulty of Paying Living Expenses: Not on file  Food Insecurity:   . Worried About Charity fundraiser in the Last Year: Not on file  . Ran Out of Food in the Last Year: Not on file  Transportation Needs:   . Lack of Transportation (Medical): Not on file  . Lack of Transportation (Non-Medical): Not on file  Physical Activity:   . Days of Exercise per Week: Not on file  . Minutes of Exercise per Session: Not on file  Stress:   . Feeling of Stress : Not on file  Social Connections:   . Frequency of Communication with Friends and Family: Not on file  . Frequency of Social Gatherings with Friends and Family: Not on file  . Attends Religious Services: Not on file  . Active Member of Clubs or Organizations: Not on file  . Attends Archivist Meetings: Not on file  . Marital Status: Not on file   Family History: Family History  Problem Relation Age of Onset  . CAD Father        Died of MI at age 89  . Cancer Mother   . Colon cancer Neg Hx   . Stomach cancer Neg Hx    Allergies:  No Known Allergies Medications: See med rec.  Review of Systems: No headache, visual changes, nausea, vomiting, diarrhea, constipation, dizziness, abdominal pain, skin rash, fevers, chills, night sweats, swollen lymph nodes, weight loss, chest pain, body aches, joint swelling, muscle aches, shortness of breath, mood changes, visual or auditory hallucinations.  Objective:    General: Well Developed, well nourished, and in no acute distress.  Neuro: Alert and oriented x3, extra-ocular muscles intact, sensation grossly intact. Cranial nerves II through XII are intact, motor, sensory, and coordinative functions are all intact. HEENT: Normocephalic, atraumatic, pupils equal round reactive to light, neck  supple, no masses, no lymphadenopathy, thyroid nonpalpable. Oropharynx, nasopharynx, external ear canals are unremarkable. Skin: Warm and dry, no rashes noted.  There does appear to be a skin horn over his left hand as well as residual skin cancer under his right eye.  He also has a seborrheic keratosis in his right cubital fossa and under his left eye. Cardiac: Regular rate and rhythm, no murmurs rubs or gallops.  Respiratory: Clear to auscultation bilaterally. Not using accessory muscles, speaking in full sentences.  Abdominal: Soft, nontender, nondistended, positive bowel sounds, no masses, no organomegaly.  Musculoskeletal: Shoulder, elbow, wrist, hip, knee, ankle stable, and with full range of motion.  He does have some tenderness at the sternomanubrial junction and at the scar from his CABG.  No palpable sternal click.  Impression and Recommendations:    The patient was counselled, risk factors were discussed, anticipatory guidance given.  Annual physical exam Annual physical as above. We checked a PSA last year so we will hold off this year and check again next year. Checking routine labs. Colonoscopy was in 2013, not due again until 2022. Return in a year.  Hyperlipidemia History of CABG, overall doing well, no chest pain, occasional palpitations that last a few seconds, not with exertion, random. Checking labs. Continue high potency statin.  Hypertension Well-controlled on current medications  Obstructive uropathy Starting to note slight improvements with Flomax, he is only been taking it for about 3 weeks. If insufficient symptom relief after a full month he will go up to twice daily.  S/P CABG x 4 Having a bit of discomfort upper sternum, tough to tell whether this is at the scar or at his sternotomy itself. I am going to add sternum and manubrial x-rays to ensure no nonunion. He does not get any sternal click.  Skin lesion Seborrheic keratoses under left eye and at  right cubital fossa. There do appear to be skin cancers under his right eye and over his left hand dorsally. He will return for shave excision of the left hand and right facial lesions.   ___________________________________________ Gwen Her. Dianah Field, M.D., ABFM., CAQSM. Primary Care and Sports Medicine [facility]  Adjunct Professor of [place] [employer] [facility] [employer]

## 2019-12-02 NOTE — Assessment & Plan Note (Signed)
Having a bit of discomfort upper sternum, tough to tell whether this is at the scar or at his sternotomy itself. I am going to add sternum and manubrial x-rays to ensure no nonunion. He does not get any sternal click.

## 2019-12-02 NOTE — Assessment & Plan Note (Signed)
Well-controlled on current medications 

## 2019-12-02 NOTE — Assessment & Plan Note (Signed)
History of CABG, overall doing well, no chest pain, occasional palpitations that last a few seconds, not with exertion, random. Checking labs. Continue high potency statin.

## 2019-12-02 NOTE — Assessment & Plan Note (Signed)
Annual physical as above. We checked a PSA last year so we will hold off this year and check again next year. Checking routine labs. Colonoscopy was in 2013, not due again until 2022. Return in a year.

## 2019-12-20 DIAGNOSIS — E7800 Pure hypercholesterolemia, unspecified: Secondary | ICD-10-CM | POA: Diagnosis not present

## 2019-12-21 LAB — LIPID PANEL W/REFLEX DIRECT LDL
Cholesterol: 113 mg/dL (ref <200)
HDL: 35 mg/dL — ABNORMAL LOW (ref >=40)
LDL Cholesterol (Calc): 59 mg/dL (calc)
Non-HDL Cholesterol (Calc): 78 mg/dL (calc) (ref <130)
Total CHOL/HDL Ratio: 3.2 (calc) (ref <5.0)
Triglycerides: 111 mg/dL (ref <150)

## 2019-12-21 LAB — CBC WITH DIFFERENTIAL/PLATELET
Absolute Monocytes: 403 cells/uL (ref 200–950)
Basophils Absolute: 31 cells/uL (ref 0–200)
Basophils Relative: 0.5 %
Eosinophils Absolute: 167 cells/uL (ref 15–500)
Eosinophils Relative: 2.7 %
HCT: 44.7 % (ref 38.5–50.0)
Hemoglobin: 15 g/dL (ref 13.2–17.1)
Lymphs Abs: 2021 cells/uL (ref 850–3900)
MCH: 29 pg (ref 27.0–33.0)
MCHC: 33.6 g/dL (ref 32.0–36.0)
MCV: 86.5 fL (ref 80.0–100.0)
MPV: 11.3 fL (ref 7.5–12.5)
Monocytes Relative: 6.5 %
Neutro Abs: 3577 cells/uL (ref 1500–7800)
Neutrophils Relative %: 57.7 %
Platelets: 250 10*3/uL (ref 140–400)
RBC: 5.17 10*6/uL (ref 4.20–5.80)
RDW: 13.1 % (ref 11.0–15.0)
Total Lymphocyte: 32.6 %
WBC: 6.2 10*3/uL (ref 3.8–10.8)

## 2019-12-21 LAB — COMPLETE METABOLIC PANEL WITH GFR
AG Ratio: 1.3 (calc) (ref 1.0–2.5)
ALT: 24 U/L (ref 9–46)
AST: 19 U/L (ref 10–35)
Albumin: 3.6 g/dL (ref 3.6–5.1)
Alkaline phosphatase (APISO): 81 U/L (ref 35–144)
BUN: 14 mg/dL (ref 7–25)
CO2: 30 mmol/L (ref 20–32)
Calcium: 8.6 mg/dL (ref 8.6–10.3)
Chloride: 105 mmol/L (ref 98–110)
Creat: 0.98 mg/dL (ref 0.70–1.25)
GFR, Est African American: 95 mL/min/{1.73_m2} (ref >=60)
GFR, Est Non African American: 82 mL/min/{1.73_m2} (ref >=60)
Globulin: 2.8 g/dL (calc) (ref 1.9–3.7)
Glucose, Bld: 89 mg/dL (ref 65–99)
Potassium: 4.7 mmol/L (ref 3.5–5.3)
Sodium: 139 mmol/L (ref 135–146)
Total Bilirubin: 0.8 mg/dL (ref 0.2–1.2)
Total Protein: 6.4 g/dL (ref 6.1–8.1)

## 2019-12-21 LAB — HEMOGLOBIN A1C
Hgb A1c MFr Bld: 5.4 % of total Hgb (ref <5.7)
Mean Plasma Glucose: 108 (calc)
eAG (mmol/L): 6 (calc)

## 2019-12-21 LAB — TSH: TSH: 1.13 mIU/L (ref 0.40–4.50)

## 2020-01-27 ENCOUNTER — Other Ambulatory Visit: Payer: Self-pay | Admitting: Sports Medicine

## 2020-01-27 DIAGNOSIS — N139 Obstructive and reflux uropathy, unspecified: Secondary | ICD-10-CM

## 2020-07-22 ENCOUNTER — Other Ambulatory Visit: Payer: Self-pay | Admitting: Cardiology

## 2020-07-24 ENCOUNTER — Other Ambulatory Visit: Payer: Self-pay | Admitting: Cardiovascular Disease

## 2020-08-16 ENCOUNTER — Other Ambulatory Visit: Payer: Self-pay | Admitting: Cardiovascular Disease

## 2020-08-16 ENCOUNTER — Other Ambulatory Visit: Payer: Self-pay | Admitting: Cardiology

## 2020-08-31 ENCOUNTER — Other Ambulatory Visit: Payer: Self-pay | Admitting: Cardiology

## 2020-09-19 ENCOUNTER — Other Ambulatory Visit: Payer: Self-pay | Admitting: Cardiology

## 2020-10-14 ENCOUNTER — Ambulatory Visit (INDEPENDENT_AMBULATORY_CARE_PROVIDER_SITE_OTHER): Payer: BC Managed Care – PPO | Admitting: Cardiology

## 2020-10-14 ENCOUNTER — Other Ambulatory Visit: Payer: Self-pay

## 2020-10-14 ENCOUNTER — Encounter: Payer: Self-pay | Admitting: Cardiology

## 2020-10-14 VITALS — BP 141/81 | HR 59 | Ht 68.0 in | Wt 217.0 lb

## 2020-10-14 DIAGNOSIS — I2510 Atherosclerotic heart disease of native coronary artery without angina pectoris: Secondary | ICD-10-CM | POA: Diagnosis not present

## 2020-10-14 DIAGNOSIS — E7800 Pure hypercholesterolemia, unspecified: Secondary | ICD-10-CM

## 2020-10-14 DIAGNOSIS — I10 Essential (primary) hypertension: Secondary | ICD-10-CM | POA: Diagnosis not present

## 2020-10-14 MED ORDER — METOPROLOL TARTRATE 25 MG PO TABS: 25.0000 mg | ORAL_TABLET | Freq: Two times a day (BID) | ORAL | 3 refills | Status: DC

## 2020-10-14 NOTE — Patient Instructions (Signed)

## 2020-10-14 NOTE — Progress Notes (Signed)
HPI: FU CAD. Echocardiogram May 2018 showed normal LV systolic function and mild left atrial enlargement. Cardiac catheterization June 2018 showed a 70% D1, 90 mid LAD, 90% proximal right coronary artery followed by a 100% lesion and occluded circumflex. Ejection fraction 50-55%. Carotid Dopplers June 2018 with no significant stenosis. Patient subsequently underwent coronary artery bypassing graft with a LIMA The LAD, saphenous pain graft to the diagonal, saphenous vein graft to the acute marginal and saphenous vein graft to the PDA. Since last seen, the patient denies any dyspnea on exertion, orthopnea, PND, pedal edema, palpitations, syncope or chest pain.   Current Outpatient Medications  Medication Sig Dispense Refill  . aspirin EC 81 MG tablet Take 1 tablet (81 mg total) by mouth daily. 90 tablet 3  . atorvastatin (LIPITOR) 80 MG tablet TAKE 1 TABLET (80 MG TOTAL) BY MOUTH DAILY AT 6 PM. 30 tablet 1  . metoprolol tartrate (LOPRESSOR) 25 MG tablet TAKE 1 TABLET BY MOUTH TWICE A DAY 15 tablet 0  . tamsulosin (FLOMAX) 0.4 MG CAPS capsule TAKE 1 CAPSULE (0.4 MG TOTAL) BY MOUTH DAILY AFTER BREAKFAST. 90 capsule 1   No current facility-administered medications for this visit.     Past Medical History:  Diagnosis Date  . Basal cell carcinoma of nasal tip 08/24/2012  . Coronary artery disease    a. Abnl stress test -> s/p CABGx4 in 04/2017 with LIMA-LAD, SVG-diag, SVG-AM, SVG-PDA.  Marland Kitchen Hyperlipidemia   . Hypertension   . Male hypogonadism 08/24/2012  . Onychomycosis 08/24/2012    Past Surgical History:  Procedure Laterality Date  . CARDIAC CATHETERIZATION  05/08/2017  . CARDIOVASCULAR STRESS TEST  04/2017  . CORONARY ARTERY BYPASS GRAFT N/A 05/09/2017   Procedure: CORONARY ARTERY BYPASS GRAFTING times four using left internal mammary artery and right leg saphenous vein  ;  Surgeon: Gaye Pollack, MD;  Location: [facility];  Service: Open Heart Surgery;  Laterality: N/A;  . LEFT HEART  CATH AND CORONARY ANGIOGRAPHY N/A 05/08/2017   Procedure: Left Heart Cath and Coronary Angiography;  Surgeon: Troy Sine, MD;  Location: [facility];  Service: Cardiovascular;  Laterality: N/A;  . TEE WITHOUT CARDIOVERSION N/A 05/09/2017   Procedure: TRANSESOPHAGEAL ECHOCARDIOGRAM (TEE);  Surgeon: Gaye Pollack, MD;  Location: [facility];  Service: Open Heart Surgery;  Laterality: N/A;  . TONSILLECTOMY  1968    Social History   Socioeconomic History  . Marital status: Married    Spouse name: Not on file  . Number of children: Not on file  . Years of education: Not on file  . Highest education level: Not on file  Occupational History    Comment: Unemployed  Tobacco Use  . Smoking status: Never Smoker  . Smokeless tobacco: Never Used  Vaping Use  . Vaping Use: Never used  Substance and Sexual Activity  . Alcohol use: Yes    Comment: Occasional  . Drug use: No  . Sexual activity: Not on file  Other Topics Concern  . Not on file  Social History Narrative  . Not on file   Social Determinants of Health   Financial Resource Strain:   . Difficulty of Paying Living Expenses: Not on file  Food Insecurity:   . Worried About Charity fundraiser in the Last Year: Not on file  . Ran Out of Food in the Last Year: Not on file  Transportation Needs:   . Lack of Transportation (Medical): Not on file  .  Lack of Transportation (Non-Medical): Not on file  Physical Activity:   . Days of Exercise per Week: Not on file  . Minutes of Exercise per Session: Not on file  Stress:   . Feeling of Stress : Not on file  Social Connections:   . Frequency of Communication with Friends and Family: Not on file  . Frequency of Social Gatherings with Friends and Family: Not on file  . Attends Religious Services: Not on file  . Active Member of Clubs or Organizations: Not on file  . Attends Archivist Meetings: Not on file  . Marital Status: Not on file  Intimate Partner Violence:   .  Fear of Current or Ex-Partner: Not on file  . Emotionally Abused: Not on file  . Physically Abused: Not on file  . Sexually Abused: Not on file    Family History  Problem Relation Age of Onset  . CAD Father        Died of MI at age 23  . Cancer Mother   . Colon cancer Neg Hx   . Stomach cancer Neg Hx     ROS: no fevers or chills, productive cough, hemoptysis, dysphasia, odynophagia, melena, hematochezia, dysuria, hematuria, rash, seizure activity, orthopnea, PND, pedal edema, claudication. Remaining systems are negative.  Physical Exam: Well-developed well-nourished in no acute distress.  Skin is warm and dry.  HEENT is normal.  Neck is supple.  Chest is clear to auscultation with normal expansion.  Cardiovascular exam is regular rate and rhythm.  Abdominal exam nontender or distended. No masses palpated. Extremities show no edema. neuro grossly intact  ECG-sinus bradycardia at a rate at 59, cannot rule out prior inferior infarct.  Personally reviewed  A/P  1 coronary artery disease status post coronary artery bypass graft-patient doing well with no chest pain.  Continue aspirin and statin.  2 hypertension-blood pressure is borderline; I have asked him to track this and we will advance medications as needed.  3 hyperlipidemia-continue statin.  Kirk Ruths, MD

## 2020-11-19 DIAGNOSIS — Z03818 Encounter for observation for suspected exposure to other biological agents ruled out: Secondary | ICD-10-CM | POA: Diagnosis not present

## 2020-11-27 ENCOUNTER — Telehealth: Payer: Self-pay

## 2020-11-27 NOTE — Telephone Encounter (Signed)
Patient reports that he has been diagnosed with COVID. So far the symptoms are pretty mild.  He is wanting to make sure there isn't anything he needs to do with concern for his heart issues. Please advise.

## 2020-11-27 NOTE — Telephone Encounter (Signed)
There is not but he needs to keep a close eye on symptoms, do the typical home treatments that he would for the flu or cold.  If he develops shortness of breath, swelling in either leg then he should call me.

## 2020-11-30 NOTE — Telephone Encounter (Signed)
Left message for a return call from patient.  

## 2020-12-02 DIAGNOSIS — U071 COVID-19: Secondary | ICD-10-CM | POA: Diagnosis not present

## 2020-12-02 DIAGNOSIS — Z03818 Encounter for observation for suspected exposure to other biological agents ruled out: Secondary | ICD-10-CM | POA: Diagnosis not present

## 2020-12-02 DIAGNOSIS — Z20822 Contact with and (suspected) exposure to covid-19: Secondary | ICD-10-CM | POA: Diagnosis not present

## 2020-12-02 NOTE — Telephone Encounter (Signed)
10 days is sufficient but really just needs to wait until after symptoms are gone.

## 2020-12-02 NOTE — Telephone Encounter (Signed)
Spoke with patient, he reports that most of his symptoms has subsided and he is feeling much better. He wants to know when he should get his booster vaccine? I told him I thought there was a small time period between infection and booster but would verify and call him back. Please advise.

## 2020-12-03 NOTE — Telephone Encounter (Signed)
Patient aware of recommendation.  

## 2020-12-08 ENCOUNTER — Other Ambulatory Visit: Payer: Self-pay | Admitting: Cardiology

## 2020-12-09 ENCOUNTER — Other Ambulatory Visit: Payer: Self-pay

## 2020-12-09 ENCOUNTER — Encounter: Payer: BC Managed Care – PPO | Admitting: Sports Medicine

## 2021-05-12 ENCOUNTER — Ambulatory Visit (INDEPENDENT_AMBULATORY_CARE_PROVIDER_SITE_OTHER): Payer: BC Managed Care – PPO | Admitting: Sports Medicine

## 2021-05-12 ENCOUNTER — Other Ambulatory Visit: Payer: Self-pay

## 2021-05-12 ENCOUNTER — Encounter: Payer: Self-pay | Admitting: Sports Medicine

## 2021-05-12 VITALS — BP 103/58 | HR 55 | Ht 68.0 in | Wt 215.0 lb

## 2021-05-12 DIAGNOSIS — Z0000 Encounter for general adult medical examination without abnormal findings: Secondary | ICD-10-CM | POA: Diagnosis not present

## 2021-05-12 DIAGNOSIS — E291 Testicular hypofunction: Secondary | ICD-10-CM | POA: Diagnosis not present

## 2021-05-12 DIAGNOSIS — I2510 Atherosclerotic heart disease of native coronary artery without angina pectoris: Secondary | ICD-10-CM | POA: Diagnosis not present

## 2021-05-12 DIAGNOSIS — N139 Obstructive and reflux uropathy, unspecified: Secondary | ICD-10-CM

## 2021-05-12 NOTE — Assessment & Plan Note (Signed)
Annual physical as above, checking routine labs. He would like to do his Shingrix series when he gets his labs sometime later in the week or next week.

## 2021-05-12 NOTE — Assessment & Plan Note (Signed)
Increasing fatigue, tiredness, he attributes this to lack of exercise and may be mild depression due to his work. I will go ahead and get his testosterone levels.

## 2021-05-12 NOTE — Progress Notes (Signed)
Subjective:    CC: Annual Physical Exam  HPI:  This patient is here for their annual physical  I reviewed the past medical history, family history, social history, surgical history, and allergies today and no changes were needed.  Please see the problem list section below in epic for further details.  Past Medical History: Past Medical History:  Diagnosis Date   Basal cell carcinoma of nasal tip 08/24/2012   Coronary artery disease    a. Abnl stress test -> s/p CABGx4 in 04/2017 with LIMA-LAD, SVG-diag, SVG-AM, SVG-PDA.   Hyperlipidemia    Hypertension    Male hypogonadism 08/24/2012   Onychomycosis 08/24/2012   Past Surgical History: Past Surgical History:  Procedure Laterality Date   CARDIAC CATHETERIZATION  05/08/2017   CARDIOVASCULAR STRESS TEST  04/2017   CORONARY ARTERY BYPASS GRAFT N/A 05/09/2017   Procedure: CORONARY ARTERY BYPASS GRAFTING times four using left internal mammary artery and right leg saphenous vein  ;  Surgeon: Gaye Pollack, MD;  Location: [facility];  Service: Open Heart Surgery;  Laterality: N/A;   LEFT HEART CATH AND CORONARY ANGIOGRAPHY N/A 05/08/2017   Procedure: Left Heart Cath and Coronary Angiography;  Surgeon: Troy Sine, MD;  Location: [facility];  Service: Cardiovascular;  Laterality: N/A;   TEE WITHOUT CARDIOVERSION N/A 05/09/2017   Procedure: TRANSESOPHAGEAL ECHOCARDIOGRAM (TEE);  Surgeon: Gaye Pollack, MD;  Location: [facility];  Service: Open Heart Surgery;  Laterality: N/A;   TONSILLECTOMY  1968   Social History: Social History   Socioeconomic History   Marital status: Married    Spouse name: Not on file   Number of children: Not on file   Years of education: Not on file   Highest education level: Not on file  Occupational History    Comment: Unemployed  Tobacco Use   Smoking status: Never   Smokeless tobacco: Never  Vaping Use   Vaping Use: Never used  Substance and Sexual Activity   Alcohol use: Yes    Comment:  Occasional   Drug use: No   Sexual activity: Not on file  Other Topics Concern   Not on file  Social History Narrative   Not on file   Social Determinants of Health   Financial Resource Strain: Not on file  Food Insecurity: Not on file  Transportation Needs: Not on file  Physical Activity: Not on file  Stress: Not on file  Social Connections: Not on file   Family History: Family History  Problem Relation Age of Onset   CAD Father        Died of MI at age 50   Cancer Mother    Colon cancer Neg Hx    Stomach cancer Neg Hx    Allergies: Allergies  Allergen Reactions   Anesthetics, Amide    [name] [Lidocaine] Rash    Anything ending in (-caine) per patient    Medications: See med rec.  Review of Systems: No headache, visual changes, nausea, vomiting, diarrhea, constipation, dizziness, abdominal pain, skin rash, fevers, chills, night sweats, swollen lymph nodes, weight loss, chest pain, body aches, joint swelling, muscle aches, shortness of breath, mood changes, visual or auditory hallucinations.  Objective:    General: Well Developed, well nourished, and in no acute distress.  Neuro: Alert and oriented x3, extra-ocular muscles intact, sensation grossly intact. Cranial nerves II through XII are intact, motor, sensory, and coordinative functions are all intact. HEENT: Normocephalic, atraumatic, pupils equal round reactive to light, neck supple, no  masses, no lymphadenopathy, thyroid nonpalpable. Oropharynx, nasopharynx, external ear canals are unremarkable. Skin: Warm and dry, no rashes noted.  Cardiac: Regular rate and rhythm, no murmurs rubs or gallops.  Respiratory: Clear to auscultation bilaterally. Not using accessory muscles, speaking in full sentences.  Abdominal: Soft, nontender, nondistended, positive bowel sounds, no masses, no organomegaly.  There were a few benign feeling lipomas on his abdominal wall. Musculoskeletal: Shoulder, elbow, wrist, hip, knee, ankle  stable, and with full range of motion.  Impression and Recommendations:    The patient was counselled, risk factors were discussed, anticipatory guidance given.  Annual physical exam Annual physical as above, checking routine labs. He would like to do his Shingrix series when he gets his labs sometime later in the week or next week.  Male hypogonadism Increasing fatigue, tiredness, he attributes this to lack of exercise and may be mild depression due to his work. I will go ahead and get his testosterone levels.  Obstructive uropathy Did note some improvement on Flomax but has been inconsistent with dosing. He will try it for a solid month and if continuing to have significant nocturia we will switch to tadalafil 5 daily.   ___________________________________________ Gwen Her. Dianah Field, M.D., ABFM., CAQSM. Primary Care and Sports Medicine [facility]  Adjunct Professor of [place] [employer] [facility] [employer]

## 2021-05-12 NOTE — Assessment & Plan Note (Signed)
Did note some improvement on Flomax but has been inconsistent with dosing. He will try it for a solid month and if continuing to have significant nocturia we will switch to tadalafil 5 daily.

## 2021-05-14 ENCOUNTER — Ambulatory Visit (INDEPENDENT_AMBULATORY_CARE_PROVIDER_SITE_OTHER): Payer: BC Managed Care – PPO | Admitting: Sports Medicine

## 2021-05-14 ENCOUNTER — Other Ambulatory Visit: Payer: Self-pay

## 2021-05-14 VITALS — Temp 97.7°F

## 2021-05-14 DIAGNOSIS — Z23 Encounter for immunization: Secondary | ICD-10-CM

## 2021-05-14 DIAGNOSIS — N139 Obstructive and reflux uropathy, unspecified: Secondary | ICD-10-CM | POA: Diagnosis not present

## 2021-05-14 DIAGNOSIS — I2510 Atherosclerotic heart disease of native coronary artery without angina pectoris: Secondary | ICD-10-CM | POA: Diagnosis not present

## 2021-05-14 DIAGNOSIS — E291 Testicular hypofunction: Secondary | ICD-10-CM | POA: Diagnosis not present

## 2021-05-14 NOTE — Progress Notes (Signed)
Established Patient Office Visit  Subjective:  Patient ID: Dominic Ray, male    DOB: [birth year]  Age: 65 y.o. MRN: [id number]  CC:  Chief Complaint  Patient presents with   Immunizations    Shingles vaccine    HPI [facility] presents for shingles vaccine.   Past Medical History:  Diagnosis Date   Basal cell carcinoma of nasal tip 08/24/2012   Coronary artery disease    a. Abnl stress test -> s/p CABGx4 in 04/2017 with LIMA-LAD, SVG-diag, SVG-AM, SVG-PDA.   Hyperlipidemia    Hypertension    Male hypogonadism 08/24/2012   Onychomycosis 08/24/2012    Past Surgical History:  Procedure Laterality Date   CARDIAC CATHETERIZATION  05/08/2017   CARDIOVASCULAR STRESS TEST  04/2017   CORONARY ARTERY BYPASS GRAFT N/A 05/09/2017   Procedure: CORONARY ARTERY BYPASS GRAFTING times four using left internal mammary artery and right leg saphenous vein  ;  Surgeon: Gaye Pollack, MD;  Location: [place];  Service: Open Heart Surgery;  Laterality: N/A;   LEFT HEART CATH AND CORONARY ANGIOGRAPHY N/A 05/08/2017   Procedure: Left Heart Cath and Coronary Angiography;  Surgeon: Troy Sine, MD;  Location: [facility] [facility];  Service: Cardiovascular;  Laterality: N/A;   TEE WITHOUT CARDIOVERSION N/A 05/09/2017   Procedure: TRANSESOPHAGEAL ECHOCARDIOGRAM (TEE);  Surgeon: Gaye Pollack, MD;  Location: [facility];  Service: Open Heart Surgery;  Laterality: N/A;   TONSILLECTOMY  1968    Family History  Problem Relation Age of Onset   CAD Father        Died of MI at age 63   Cancer Mother    Colon cancer Neg Hx    Stomach cancer Neg Hx     Social History   Socioeconomic History   Marital status: Married    Spouse name: Not on file   Number of children: Not on file   Years of education: Not on file   Highest education level: Not on file  Occupational History    Comment: Unemployed  Tobacco Use   Smoking status: Never   Smokeless tobacco: Never  Vaping Use   Vaping Use: Never used   Substance and Sexual Activity   Alcohol use: Yes    Comment: Occasional   Drug use: No   Sexual activity: Not on file  Other Topics Concern   Not on file  Social History Narrative   Not on file   Social Determinants of Health   Financial Resource Strain: Not on file  Food Insecurity: Not on file  Transportation Needs: Not on file  Physical Activity: Not on file  Stress: Not on file  Social Connections: Not on file  Intimate Partner Violence: Not on file    Outpatient Medications Prior to Visit  Medication Sig Dispense Refill   aspirin EC 81 MG tablet Take 1 tablet (81 mg total) by mouth daily. 90 tablet 3   atorvastatin (LIPITOR) 80 MG tablet TAKE 1 TABLET (80 MG TOTAL) BY MOUTH DAILY AT 6 PM. 90 tablet 3   metoprolol tartrate (LOPRESSOR) 25 MG tablet Take 1 tablet (25 mg total) by mouth 2 (two) times daily. 180 tablet 3   No facility-administered medications prior to visit.    Allergies  Allergen Reactions   Anesthetics, Amide    [name] [Lidocaine] Rash    Anything ending in (-caine) per patient     ROS Review of Systems    Objective:    Physical Exam  Temp 97.7  F (36.5 C) (Temporal)  Wt Readings from Last 3 Encounters:  05/12/21 215 lb (97.5 kg)  10/14/20 217 lb (98.4 kg)  12/02/19 217 lb (98.4 kg)     Health Maintenance Due  Topic Date Due   Pneumococcal Vaccine 62-86 Years old (1 - PCV) Never done   COVID-19 Vaccine (3 - Pfizer risk series) 07/11/2020    There are no preventive care reminders to display for this patient.  Lab Results  Component Value Date   TSH 1.13 12/20/2019   Lab Results  Component Value Date   WBC 6.2 12/20/2019   HGB 15.0 12/20/2019   HCT 44.7 12/20/2019   MCV 86.5 12/20/2019   PLT 250 12/20/2019   Lab Results  Component Value Date   NA 139 12/20/2019   K 4.7 12/20/2019   CO2 30 12/20/2019   GLUCOSE 89 12/20/2019   BUN 14 12/20/2019   CREATININE 0.98 12/20/2019   BILITOT 0.8 12/20/2019   ALKPHOS 103  06/22/2017   AST 19 12/20/2019   ALT 24 12/20/2019   PROT 6.4 12/20/2019   ALBUMIN 3.8 06/22/2017   CALCIUM 8.6 12/20/2019   ANIONGAP 9 05/11/2017   Lab Results  Component Value Date   CHOL 113 12/20/2019   Lab Results  Component Value Date   HDL 35 (L) 12/20/2019   Lab Results  Component Value Date   LDLCALC 59 12/20/2019   Lab Results  Component Value Date   TRIG 111 12/20/2019   Lab Results  Component Value Date   CHOLHDL 3.2 12/20/2019   Lab Results  Component Value Date   HGBA1C 5.4 12/20/2019      Assessment & Plan:  Shingles vaccine - Patient tolerated injection well without complications.   Problem List Items Addressed This Visit   None Visit Diagnoses     Need for shingles vaccine    -  Primary   Relevant Orders   Varicella-zoster vaccine IM (Shingrix) (Completed)       No orders of the defined types were placed in this encounter.   Follow-up: Return in about 2 months (around 07/15/2021) for Shingles vaccine. Durene Romans, Monico [place], [place]

## 2021-05-18 LAB — HEMOGLOBIN A1C
Hgb A1c MFr Bld: 5.3 % of total Hgb (ref <5.7)
Mean Plasma Glucose: 105 mg/dL
eAG (mmol/L): 5.8 mmol/L

## 2021-05-18 LAB — LIPID PANEL
Cholesterol: 120 mg/dL (ref <200)
HDL: 39 mg/dL — ABNORMAL LOW (ref >=40)
LDL Cholesterol (Calc): 63 mg/dL (calc)
Non-HDL Cholesterol (Calc): 81 mg/dL (calc) (ref <130)
Total CHOL/HDL Ratio: 3.1 (calc) (ref <5.0)
Triglycerides: 101 mg/dL (ref <150)

## 2021-05-18 LAB — TESTOSTERONE, FREE & TOTAL
Free Testosterone: 56.4 pg/mL (ref 35.0–155.0)
Testosterone, Total, LC-MS-MS: 304 ng/dL (ref 250–1100)

## 2021-05-18 LAB — COMPREHENSIVE METABOLIC PANEL
AG Ratio: 1.3 (calc) (ref 1.0–2.5)
ALT: 20 U/L (ref 9–46)
AST: 18 U/L (ref 10–35)
Albumin: 3.9 g/dL (ref 3.6–5.1)
Alkaline phosphatase (APISO): 78 U/L (ref 35–144)
BUN: 13 mg/dL (ref 7–25)
CO2: 27 mmol/L (ref 20–32)
Calcium: 9.1 mg/dL (ref 8.6–10.3)
Chloride: 105 mmol/L (ref 98–110)
Creat: 0.91 mg/dL (ref 0.70–1.25)
Globulin: 2.9 g/dL (calc) (ref 1.9–3.7)
Glucose, Bld: 86 mg/dL (ref 65–99)
Potassium: 4.5 mmol/L (ref 3.5–5.3)
Sodium: 139 mmol/L (ref 135–146)
Total Bilirubin: 0.7 mg/dL (ref 0.2–1.2)
Total Protein: 6.8 g/dL (ref 6.1–8.1)

## 2021-05-18 LAB — CBC
HCT: 47.9 % (ref 38.5–50.0)
Hemoglobin: 15.8 g/dL (ref 13.2–17.1)
MCH: 28.8 pg (ref 27.0–33.0)
MCHC: 33 g/dL (ref 32.0–36.0)
MCV: 87.4 fL (ref 80.0–100.0)
MPV: 11 fL (ref 7.5–12.5)
Platelets: 244 10*3/uL (ref 140–400)
RBC: 5.48 10*6/uL (ref 4.20–5.80)
RDW: 13.1 % (ref 11.0–15.0)
WBC: 6.6 10*3/uL (ref 3.8–10.8)

## 2021-05-18 LAB — PSA, TOTAL AND FREE
PSA, % Free: 18 % (calc) — ABNORMAL LOW (ref >25)
PSA, Free: 0.4 ng/mL
PSA, Total: 2.2 ng/mL (ref <=4.0)

## 2021-05-18 LAB — TSH: TSH: 1.63 mIU/L (ref 0.40–4.50)

## 2021-06-04 ENCOUNTER — Telehealth (INDEPENDENT_AMBULATORY_CARE_PROVIDER_SITE_OTHER): Payer: BC Managed Care – PPO | Admitting: Sports Medicine

## 2021-06-04 DIAGNOSIS — E291 Testicular hypofunction: Secondary | ICD-10-CM | POA: Diagnosis not present

## 2021-06-04 NOTE — Progress Notes (Signed)
   Virtual Visit via Telephone   I connected with  Dominic Ray  on 06/04/21 by telephone/telehealth and verified that I am speaking with the correct person using two identifiers.   I discussed the limitations, risks, security and privacy concerns of performing an evaluation and management service by telephone, including the higher likelihood of inaccurate diagnosis and treatment, and the availability of in person appointments.  We also discussed the likely need of an additional face to face encounter for complete and high quality delivery of care.  I also discussed with the patient that there may be a patient responsible charge related to this service. The patient expressed understanding and wishes to proceed.  Provider location is in medical facility. Patient location is at their home, different from provider location. People involved in care of the patient during this telehealth encounter were myself, my nurse/medical assistant, and my front office/scheduling team member.  Review of Systems: No fevers, chills, night sweats, weight loss, chest pain, or shortness of breath.   Objective Findings:    General: Speaking full sentences, no audible heavy breathing.  Sounds alert and appropriately interactive.    Independent interpretation of tests performed by another provider:   None.  Brief History, Exam, Impression, and Recommendations:    Male hypogonadism I connected with Dominic Ray via telephone today, he has hypergonadism, he does have some symptoms of fatigue, sex drive lower than it used to be. We discussed testosterone supplementation, we have also discussed this about 6 years ago. We also discussed the risks and benefits, we talked about how prostate cancer risk is generally brought up to the average risk with testosterone supplementation, monitoring of hemoglobin, he would like to think about this for a little longer before we proceed, I think this is completely appropriate and if we do start  we would start with every 2 week office visit testosterone injections.  We would recheck testosterone 1 week after his third injection and then monitor CBC, testosterone, PSA every 6 months thereafter.   I discussed the above assessment and treatment plan with the patient. The patient was provided an opportunity to ask questions and all were answered. The patient agreed with the plan and demonstrated an understanding of the instructions.   The patient was advised to call back or seek an in-person evaluation if the symptoms worsen or if the condition fails to improve as anticipated.   I provided 30 minutes of verbal and non-verbal time during this encounter date, time was needed to gather information, review chart, records, communicate/coordinate with staff remotely, as well as complete documentation.   ___________________________________________ Dominic Ray. Dianah Field, M.D., ABFM., CAQSM. Primary Care and Sports Medicine [facility]  Adjunct Professor of [employer]

## 2021-06-04 NOTE — Assessment & Plan Note (Signed)
I connected with Dominic Ray via telephone today, he has hypergonadism, he does have some symptoms of fatigue, sex drive lower than it used to be. We discussed testosterone supplementation, we have also discussed this about 6 years ago. We also discussed the risks and benefits, we talked about how prostate cancer risk is generally brought up to the average risk with testosterone supplementation, monitoring of hemoglobin, he would like to think about this for a little longer before we proceed, I think this is completely appropriate and if we do start we would start with every 2 week office visit testosterone injections.  We would recheck testosterone 1 week after his third injection and then monitor CBC, testosterone, PSA every 6 months thereafter.

## 2021-07-20 ENCOUNTER — Ambulatory Visit (INDEPENDENT_AMBULATORY_CARE_PROVIDER_SITE_OTHER): Payer: BC Managed Care – PPO | Admitting: Sports Medicine

## 2021-07-20 VITALS — Temp 97.6°F

## 2021-07-20 DIAGNOSIS — Z23 Encounter for immunization: Secondary | ICD-10-CM

## 2021-07-20 NOTE — Progress Notes (Signed)
Established Patient Office Visit  Subjective:  Patient ID: Dominic Ray, male    DOB: 1956/07/30  Age: 65 y.o. MRN: [id number]  CC:  Chief Complaint  Patient presents with   Immunizations    HPI Dominic Ray presents for last shingles vaccine.   Past Medical History:  Diagnosis Date   Basal cell carcinoma of nasal tip 08/24/2012   Coronary artery disease    a. Abnl stress test -> s/p CABGx4 in 04/2017 with LIMA-LAD, SVG-diag, SVG-AM, SVG-PDA.   Hyperlipidemia    Hypertension    Male hypogonadism 08/24/2012   Onychomycosis 08/24/2012    Past Surgical History:  Procedure Laterality Date   CARDIAC CATHETERIZATION  05/08/2017   CARDIOVASCULAR STRESS TEST  04/2017   CORONARY ARTERY BYPASS GRAFT N/A 05/09/2017   Procedure: CORONARY ARTERY BYPASS GRAFTING times four using left internal mammary artery and right leg saphenous vein  ;  Surgeon: Gaye Pollack, MD;  Location: [facility];  Service: Open Heart Surgery;  Laterality: N/A;   LEFT HEART CATH AND CORONARY ANGIOGRAPHY N/A 05/08/2017   Procedure: Left Heart Cath and Coronary Angiography;  Surgeon: Troy Sine, MD;  Location: [facility];  Service: Cardiovascular;  Laterality: N/A;   TEE WITHOUT CARDIOVERSION N/A 05/09/2017   Procedure: TRANSESOPHAGEAL ECHOCARDIOGRAM (TEE);  Surgeon: Gaye Pollack, MD;  Location: [facility];  Service: Open Heart Surgery;  Laterality: N/A;   TONSILLECTOMY  1968    Family History  Problem Relation Age of Onset   CAD Father        Died of MI at age 67   Cancer Mother    Colon cancer Neg Hx    Stomach cancer Neg Hx     Social History   Socioeconomic History   Marital status: Married    Spouse name: Not on file   Number of children: Not on file   Years of education: Not on file   Highest education level: Not on file  Occupational History    Comment: Unemployed  Tobacco Use   Smoking status: Never   Smokeless tobacco: Never  Vaping Use   Vaping Use: Never used  Substance and Sexual  Activity   Alcohol use: Yes    Comment: Occasional   Drug use: No   Sexual activity: Not on file  Other Topics Concern   Not on file  Social History Narrative   Not on file   Social Determinants of Health   Financial Resource Strain: Not on file  Food Insecurity: Not on file  Transportation Needs: Not on file  Physical Activity: Not on file  Stress: Not on file  Social Connections: Not on file  Intimate Partner Violence: Not on file    Outpatient Medications Prior to Visit  Medication Sig Dispense Refill   aspirin EC 81 MG tablet Take 1 tablet (81 mg total) by mouth daily. 90 tablet 3   atorvastatin (LIPITOR) 80 MG tablet TAKE 1 TABLET (80 MG TOTAL) BY MOUTH DAILY AT 6 PM. 90 tablet 3   metoprolol tartrate (LOPRESSOR) 25 MG tablet Take 1 tablet (25 mg total) by mouth 2 (two) times daily. 180 tablet 3   No facility-administered medications prior to visit.    Allergies  Allergen Reactions   Anesthetics, Amide    [name] [Lidocaine] Rash    Anything ending in (-caine) per patient     ROS Review of Systems    Objective:    Physical Exam  Temp 97.6 F (36.4 C) (Temporal)  Wt Readings from Last 3 Encounters:  05/12/21 215 lb (97.5 kg)  10/14/20 217 lb (98.4 kg)  12/02/19 217 lb (98.4 kg)     Health Maintenance Due  Topic Date Due   Pneumococcal Vaccine 73-60 Years old (1 - PCV) Never done   COVID-19 Vaccine (3 - Pfizer risk series) 07/11/2020    There are no preventive care reminders to display for this patient.  Lab Results  Component Value Date   TSH 1.63 05/14/2021   Lab Results  Component Value Date   WBC 6.6 05/14/2021   HGB 15.8 05/14/2021   HCT 47.9 05/14/2021   MCV 87.4 05/14/2021   PLT 244 05/14/2021   Lab Results  Component Value Date   NA 139 05/14/2021   K 4.5 05/14/2021   CO2 27 05/14/2021   GLUCOSE 86 05/14/2021   BUN 13 05/14/2021   CREATININE 0.91 05/14/2021   BILITOT 0.7 05/14/2021   ALKPHOS 103 06/22/2017   AST 18  05/14/2021   ALT 20 05/14/2021   PROT 6.8 05/14/2021   ALBUMIN 3.8 06/22/2017   CALCIUM 9.1 05/14/2021   ANIONGAP 9 05/11/2017   Lab Results  Component Value Date   CHOL 120 05/14/2021   Lab Results  Component Value Date   HDL 39 (L) 05/14/2021   Lab Results  Component Value Date   LDLCALC 63 05/14/2021   Lab Results  Component Value Date   TRIG 101 05/14/2021   Lab Results  Component Value Date   CHOLHDL 3.1 05/14/2021   Lab Results  Component Value Date   HGBA1C 5.3 05/14/2021      Assessment & Plan:  Shingles vaccine - Patient tolerated injection well without complications. No follow up is needed at this time.   Declined flu vaccine.   Problem List Items Addressed This Visit   None Visit Diagnoses     Need for shingles vaccine    -  Primary   Relevant Orders   Varicella-zoster vaccine IM (Shingrix) (Completed)       No orders of the defined types were placed in this encounter.   Follow-up: No follow-ups on file.    Lavell Luster, [place]

## 2021-08-12 DIAGNOSIS — Z7982 Long term (current) use of aspirin: Secondary | ICD-10-CM | POA: Diagnosis not present

## 2021-08-12 DIAGNOSIS — I10 Essential (primary) hypertension: Secondary | ICD-10-CM | POA: Diagnosis not present

## 2021-08-12 DIAGNOSIS — E669 Obesity, unspecified: Secondary | ICD-10-CM | POA: Diagnosis not present

## 2021-08-12 DIAGNOSIS — Z6832 Body mass index (BMI) 32.0-32.9, adult: Secondary | ICD-10-CM | POA: Diagnosis not present

## 2021-08-12 DIAGNOSIS — Z85828 Personal history of other malignant neoplasm of skin: Secondary | ICD-10-CM | POA: Diagnosis not present

## 2021-08-12 DIAGNOSIS — I2510 Atherosclerotic heart disease of native coronary artery without angina pectoris: Secondary | ICD-10-CM | POA: Diagnosis not present

## 2021-08-12 DIAGNOSIS — E785 Hyperlipidemia, unspecified: Secondary | ICD-10-CM | POA: Diagnosis not present

## 2021-10-21 ENCOUNTER — Other Ambulatory Visit: Payer: Self-pay | Admitting: Cardiology

## 2021-11-15 DIAGNOSIS — Z6831 Body mass index (BMI) 31.0-31.9, adult: Secondary | ICD-10-CM | POA: Diagnosis not present

## 2021-11-15 DIAGNOSIS — I2510 Atherosclerotic heart disease of native coronary artery without angina pectoris: Secondary | ICD-10-CM | POA: Diagnosis not present

## 2021-11-15 DIAGNOSIS — I10 Essential (primary) hypertension: Secondary | ICD-10-CM | POA: Diagnosis not present

## 2021-11-15 DIAGNOSIS — E785 Hyperlipidemia, unspecified: Secondary | ICD-10-CM | POA: Diagnosis not present

## 2021-11-15 DIAGNOSIS — Z8249 Family history of ischemic heart disease and other diseases of the circulatory system: Secondary | ICD-10-CM | POA: Diagnosis not present

## 2021-11-15 DIAGNOSIS — E669 Obesity, unspecified: Secondary | ICD-10-CM | POA: Diagnosis not present

## 2021-11-15 DIAGNOSIS — Z7982 Long term (current) use of aspirin: Secondary | ICD-10-CM | POA: Diagnosis not present

## 2021-11-21 ENCOUNTER — Other Ambulatory Visit: Payer: Self-pay | Admitting: Cardiology

## 2021-12-07 ENCOUNTER — Other Ambulatory Visit: Payer: Self-pay | Admitting: Cardiology

## 2021-12-14 ENCOUNTER — Other Ambulatory Visit: Payer: Self-pay | Admitting: Cardiology

## 2021-12-30 ENCOUNTER — Other Ambulatory Visit: Payer: Self-pay | Admitting: Cardiology

## 2022-01-08 ENCOUNTER — Other Ambulatory Visit: Payer: Self-pay | Admitting: Cardiology

## 2022-01-10 ENCOUNTER — Other Ambulatory Visit: Payer: Self-pay | Admitting: Cardiology

## 2022-01-12 ENCOUNTER — Other Ambulatory Visit: Payer: Self-pay | Admitting: Cardiology

## 2022-01-13 ENCOUNTER — Other Ambulatory Visit: Payer: Self-pay | Admitting: Cardiology

## 2022-01-20 ENCOUNTER — Other Ambulatory Visit: Payer: Self-pay | Admitting: Cardiology

## 2022-01-28 ENCOUNTER — Other Ambulatory Visit: Payer: Self-pay | Admitting: Cardiology

## 2022-03-02 NOTE — Progress Notes (Signed)
?Cardiology Office Note:   ? ?Date:  03/03/2022  ? ?IDTyquan Ray, DOB 09/21/1956, MRN [id number] ? ?PCP:  Silverio Decamp, MD ?[place] Cardiologist: Kirk Ruths, MD  ? ?Reason for visit: overdue 1 year follow-up ? ?History of Present Illness:   ? ?Dominic Ray is a 66 y.o. male with a hx of coronary artery disease s/p CABG - LIMA  The LAD, saphenous pain graft to the diagonal, saphenous vein graft to the acute marginal and saphenous vein graft to the PDA. ? ?He last saw [name] in December 2021.  He had no cardiac complaints.  Pressure is borderline and patient was advised to monitor blood pressure. ? ?Today, patient states he is doing well.  He recently retired.  He is very active playing pickleball and softball.  With exertion, he denies chest pain and shortness of breath.  He did say he felt some brief chest tenderness, but believes this is secondary to not exercising during vacation and then overexerting himself.  He states prior to his CABG, he felt a fluttering sensation -he has not had this since bypass. ? ?Otherwise, he states he tolerates medications well.  He asks about reconnecting with a nutritionist to try to eat healthier.  He denies palpitations, PND, orthopnea, lower extremity edema, lightheadedness and syncope.  No bleeding issues with aspirin 81 mg.  When asked, he does state he has some fatigue.  When we discussed medications, he does state he has some occasional muscle pain but thinks this is more from his exertion rather than his Lipitor. ? ?  ?Past Medical History:  ?Diagnosis Date  ? Basal cell carcinoma of nasal tip 08/24/2012  ? Coronary artery disease   ? a. Abnl stress test -> s/p CABGx4 in 04/2017 with LIMA-LAD, SVG-diag, SVG-AM, SVG-PDA.  ? Hyperlipidemia   ? Hypertension   ? Male hypogonadism 08/24/2012  ? Onychomycosis 08/24/2012  ? ? ?Past Surgical History:  ?Procedure Laterality Date  ? CARDIAC CATHETERIZATION  05/08/2017  ? CARDIOVASCULAR STRESS TEST   04/2017  ? CORONARY ARTERY BYPASS GRAFT N/A 05/09/2017  ? Procedure: CORONARY ARTERY BYPASS GRAFTING times four using left internal mammary artery and right leg saphenous vein  ;  Surgeon: Gaye Pollack, MD;  Location: [facility];  Service: Open Heart Surgery;  Laterality: N/A;  ? LEFT HEART CATH AND CORONARY ANGIOGRAPHY N/A 05/08/2017  ? Procedure: Left Heart Cath and Coronary Angiography;  Surgeon: Troy Sine, MD;  Location: [facility] [facility];  Service: Cardiovascular;  Laterality: N/A;  ? TEE WITHOUT CARDIOVERSION N/A 05/09/2017  ? Procedure: TRANSESOPHAGEAL ECHOCARDIOGRAM (TEE);  Surgeon: Gaye Pollack, MD;  Location: [facility];  Service: Open Heart Surgery;  Laterality: N/A;  ? TONSILLECTOMY  1968  ? ? ?Current Medications: ?Current Meds  ?Medication Sig  ? aspirin EC 81 MG tablet Take 1 tablet (81 mg total) by mouth daily.  ? atorvastatin (LIPITOR) 80 MG tablet TAKE 1 TABLET (80 MG TOTAL) BY MOUTH DAILY. SCHEDULE OFFICE VISIT FOR FUTURE REFILLS.  ? metoprolol succinate (TOPROL XL) 25 MG 24 hr tablet Take 0.5 tablets (12.5 mg total) by mouth daily.  ? [DISCONTINUED] metoprolol tartrate (LOPRESSOR) 25 MG tablet Take 1 tablet (25 mg total) by mouth 2 (two) times daily. PATIENT MUST KEEP UPCOMING APPT. FOR FUTURE REFILLS.  ?  ? ?Allergies:   Anesthetics, amide and [name] [lidocaine]  ? ?Social History  ? ?Socioeconomic History  ? Marital status: Married  ?  Spouse name: Not on  file  ? Number of children: Not on file  ? Years of education: Not on file  ? Highest education level: Not on file  ?Occupational History  ?  Comment: Unemployed  ?Tobacco Use  ? Smoking status: Never  ? Smokeless tobacco: Never  ?Vaping Use  ? Vaping Use: Never used  ?Substance and Sexual Activity  ? Alcohol use: Yes  ?  Comment: Occasional  ? Drug use: No  ? Sexual activity: Not on file  ?Other Topics Concern  ? Not on file  ?Social History Narrative  ? Not on file  ? ?Social Determinants of Health  ? ?Financial Resource Strain: Not on file   ?Food Insecurity: Not on file  ?Transportation Needs: Not on file  ?Physical Activity: Not on file  ?Stress: Not on file  ?Social Connections: Not on file  ?  ? ?Family History: ?The patient's family history includes CAD in his father; Cancer in his mother. There is no history of Colon cancer or Stomach cancer. ? ?ROS:   ?Please see the history of present illness.    ? ?EKGs/Labs/Other Studies Reviewed:   ? ?EKG:  The ekg ordered today demonstrates normal sinus rhythm with heart rate of 54. ? ?Recent Labs: ?05/14/2021: ALT 20; BUN 13; Creat 0.91; Hemoglobin 15.8; Platelets 244; Potassium 4.5; Sodium 139; TSH 1.63  ? ?Recent Lipid Panel ?Lab Results  ?Component Value Date/Time  ? CHOL 120 05/14/2021 12:00 AM  ? CHOL 68 (L) 06/22/2017 09:20 AM  ? TRIG 101 05/14/2021 12:00 AM  ? HDL 39 (L) 05/14/2021 12:00 AM  ? HDL 31 (L) 06/22/2017 09:20 AM  ? LDLCALC 63 05/14/2021 12:00 AM  ? ? ?Physical Exam:   ? ?VS:  BP 128/66   Pulse (!) 54   Ht '5\' 8"'$  (1.727 m)   Wt 214 lb 9.6 oz (97.3 kg)   SpO2 97%   BMI 32.63 kg/m?    ?No data found. ? ?Wt Readings from Last 3 Encounters:  ?03/03/22 214 lb 9.6 oz (97.3 kg)  ?05/12/21 215 lb (97.5 kg)  ?10/14/20 217 lb (98.4 kg)  ?  ? ?GEN:  Well nourished, well developed in no acute distress ?HEENT: Normal ?NECK: No JVD; No carotid bruits ?CARDIAC: RRR, no murmurs, rubs, gallops ?RESPIRATORY:  Clear to auscultation without rales, wheezing or rhonchi  ?ABDOMEN: Soft, non-tender, non-distended ?MUSCULOSKELETAL: No edema; No deformity  ?SKIN: Warm and dry ?NEUROLOGIC:  Alert and oriented ?PSYCHIATRIC:  Normal affect  ? ?  ?ASSESSMENT AND PLAN  ? ?Coronary artery disease with no angina ?-Status post CABG - LIMA  The LAD, saphenous pain graft to the diagonal, saphenous vein graft to the acute marginal and saphenous vein graft to the PDA. ?-Continue statin and aspirin therapy. ?-With fatigue and heart rate of 54, recommend decreasing metoprolol dose on -we will change him metoprolol XL 12.5 mg  daily. ?-Encouraged him to follow-up with nutritionist.  Hanley Seamen him a handout on cardiac secondary prevention. ? ?Hyperlipidemia with goal LDL less than 70 ?-Lipids July 22 with total cholesterol 120, HDL 39, LDL 63 and triglycerides 101.  Continue Lipitor 80 mg daily. ?-Discussed cholesterol lowering diets - Mediterranean diet, DASH diet, vegetarian diet, low-carbohydrate diet and avoidance of trans fats.  Discussed healthier choice substitutes.  Nuts, high-fiber foods, and fiber supplements may also improve lipids.   ? ?Disposition - Follow-up in 1 year with [name].   ? ? ?Medication Adjustments/Labs and Tests Ordered: ?Current medicines are reviewed at length with the patient today.  Concerns regarding medicines are outlined above.  ?Orders Placed This Encounter  ?Procedures  ? EKG 12-Lead  ? ?Meds ordered this encounter  ?Medications  ? metoprolol succinate (TOPROL XL) 25 MG 24 hr tablet  ?  Sig: Take 0.5 tablets (12.5 mg total) by mouth daily.  ?  Dispense:  90 tablet  ?  Refill:  3  ? ? ?Patient Instructions  ?Medication Instructions:  ?Stop Metoprolol Tartrate. ?Start metoprolol Succinate 25 mg ( Take 0.5 mg Tablet 12.5 mg  Daily). ?*If you need a refill on your cardiac medications before your next appointment, please call your pharmacy* ? ? ?Lab Work: ?No Labs ?If you have labs (blood work) drawn today and your tests are completely normal, you will receive your results only by: ?MyChart Message (if you have MyChart) OR ?A paper copy in the mail ?If you have any lab test that is abnormal or we need to change your treatment, we will call you to review the results. ? ? ?Testing/Procedures: ?No Testing  ? ? ?Follow-Up: ?At [facility], you and your health needs are our priority.  As part of our continuing mission to provide you with exceptional heart care, we have created designated Provider Care Teams.  These Care Teams include your primary Cardiologist (physician) and Advanced Practice Providers (APPs -   Physician Assistants and Nurse Practitioners) who all work together to provide you with the care you need, when you need it. ? ?We recommend signing up for the patient portal called "MyChart".  Sign up inform

## 2022-03-03 ENCOUNTER — Ambulatory Visit: Payer: Medicare HMO | Admitting: Physician Assistant

## 2022-03-03 ENCOUNTER — Encounter: Payer: Self-pay | Admitting: Physician Assistant

## 2022-03-03 VITALS — BP 128/66 | HR 54 | Ht 68.0 in | Wt 214.6 lb

## 2022-03-03 DIAGNOSIS — E7800 Pure hypercholesterolemia, unspecified: Secondary | ICD-10-CM | POA: Diagnosis not present

## 2022-03-03 DIAGNOSIS — I2510 Atherosclerotic heart disease of native coronary artery without angina pectoris: Secondary | ICD-10-CM

## 2022-03-03 DIAGNOSIS — I10 Essential (primary) hypertension: Secondary | ICD-10-CM

## 2022-03-03 MED ORDER — METOPROLOL SUCCINATE ER 25 MG PO TB24: 12.5000 mg | ORAL_TABLET | Freq: Every day | ORAL | 3 refills | Status: DC

## 2022-03-03 NOTE — Patient Instructions (Signed)
Medication Instructions:  ?Stop Metoprolol Tartrate. ?Start metoprolol Succinate 25 mg ( Take 0.5 mg Tablet 12.5 mg  Daily). ?*If you need a refill on your cardiac medications before your next appointment, please call your pharmacy* ? ? ?Lab Work: ?No Labs ?If you have labs (blood work) drawn today and your tests are completely normal, you will receive your results only by: ?MyChart Message (if you have MyChart) OR ?A paper copy in the mail ?If you have any lab test that is abnormal or we need to change your treatment, we will call you to review the results. ? ? ?Testing/Procedures: ?No Testing  ? ? ?Follow-Up: ?At [facility], you and your health needs are our priority.  As part of our continuing mission to provide you with exceptional heart care, we have created designated Provider Care Teams.  These Care Teams include your primary Cardiologist (physician) and Advanced Practice Providers (APPs -  Physician Assistants and Nurse Practitioners) who all work together to provide you with the care you need, when you need it. ? ?We recommend signing up for the patient portal called "MyChart".  Sign up information is provided on this After Visit Summary.  MyChart is used to connect with patients for Virtual Visits (Telemedicine).  Patients are able to view lab/test results, encounter notes, upcoming appointments, etc.  Non-urgent messages can be sent to your provider as well.   ?To learn more about what you can do with MyChart, go to [email or web address].   ? ?Your next appointment:   ?1 year(s) ? ?The format for your next appointment:   ?In Person ? ?Provider:   ?Kirk Ruths, MD   ? ? ? ? ?Important Information About Sugar ? ? ? ? ?  ?

## 2022-10-03 ENCOUNTER — Ambulatory Visit (INDEPENDENT_AMBULATORY_CARE_PROVIDER_SITE_OTHER): Payer: Medicare HMO | Admitting: Sports Medicine

## 2022-10-03 VITALS — BP 145/73 | HR 56 | Ht 68.0 in | Wt 212.1 lb

## 2022-10-03 DIAGNOSIS — Z0000 Encounter for general adult medical examination without abnormal findings: Secondary | ICD-10-CM

## 2022-10-03 DIAGNOSIS — Z1212 Encounter for screening for malignant neoplasm of rectum: Secondary | ICD-10-CM | POA: Diagnosis not present

## 2022-10-03 DIAGNOSIS — Z1211 Encounter for screening for malignant neoplasm of colon: Secondary | ICD-10-CM

## 2022-10-03 NOTE — Progress Notes (Signed)
MEDICARE ANNUAL WELLNESS VISIT  10/03/2022  Subjective:  Dominic Ray is a 66 y.o. male patient of Thekkekandam, Gwen Her, MD who had a Medicare Annual Wellness Visit today. [name] is Retired and lives with their spouse. he does not have any children. he reports that he is socially active and does interact with friends/family regularly. he is moderately physically active and enjoys playing pickle ball.  Patient Care Team: Silverio Decamp, MD as PCP - General (Family Medicine) Lelon Perla, MD as PCP - Cardiology (Cardiology)     10/03/2022    8:14 AM 05/08/2017    6:29 AM  Advanced Directives  Does Patient Have a Medical Advance Directive? No Yes  Type of Corporate treasurer of [place];Living will  Does patient want to make changes to medical advance directive?  No - Patient declined  Copy of [place] in Chart?  No - copy requested  Would patient like information on creating a medical advance directive? Yes (MAU/Ambulatory/Procedural Areas - Information given)     Hospital Utilization Over the Past 12 Months: # of hospitalizations or ER visits: 0 # of surgeries: 0  Review of Systems    Patient reports that his overall health is better when compared to last year.  Review of Systems: History obtained from chart review and the patient  All other systems negative.  Pain Assessment Pain : No/denies pain     Current Medications & Allergies (verified) Allergies as of 10/03/2022       Reactions   Anesthetics, [place]    [name] [lidocaine] Rash   Anything ending in (-caine) per patient         Medication List        Accurate as of October 03, 2022  8:51 AM. If you have any questions, ask your nurse or doctor.          aspirin EC 81 MG tablet Take 1 tablet (81 mg total) by mouth daily.   atorvastatin 80 MG tablet Commonly known as: LIPITOR TAKE 1 TABLET (80 MG TOTAL) BY MOUTH DAILY. SCHEDULE OFFICE VISIT FOR FUTURE  REFILLS.   metoprolol succinate 25 MG 24 hr tablet Commonly known as: Toprol XL Take 0.5 tablets (12.5 mg total) by mouth daily.        History (reviewed): Past Medical History:  Diagnosis Date   Basal cell carcinoma of nasal tip 08/24/2012   Coronary artery disease    a. Abnl stress test -> s/p CABGx4 in 04/2017 with LIMA-LAD, SVG-diag, SVG-AM, SVG-PDA.   Hyperlipidemia    Hypertension    Male hypogonadism 08/24/2012   Onychomycosis 08/24/2012   Past Surgical History:  Procedure Laterality Date   CARDIAC CATHETERIZATION  05/08/2017   CARDIOVASCULAR STRESS TEST  04/2017   CORONARY ARTERY BYPASS GRAFT N/A 05/09/2017   Procedure: CORONARY ARTERY BYPASS GRAFTING times four using left internal mammary artery and right leg saphenous vein  ;  Surgeon: Gaye Pollack, MD;  Location: [facility];  Service: Open Heart Surgery;  Laterality: N/A;   LEFT HEART CATH AND CORONARY ANGIOGRAPHY N/A 05/08/2017   Procedure: Left Heart Cath and Coronary Angiography;  Surgeon: Troy Sine, MD;  Location: [facility];  Service: Cardiovascular;  Laterality: N/A;   TEE WITHOUT CARDIOVERSION N/A 05/09/2017   Procedure: TRANSESOPHAGEAL ECHOCARDIOGRAM (TEE);  Surgeon: Gaye Pollack, MD;  Location: [facility];  Service: Open Heart Surgery;  Laterality: N/A;   TONSILLECTOMY  1968   Family History  Problem  Relation Age of Onset   CAD Father        Died of MI at age 66   Cancer Mother    Colon cancer Neg Hx    Stomach cancer Neg Hx    Social History   Socioeconomic History   Marital status: Married    Spouse name: [name]   Number of children: 0   Years of education: 16   Highest education level: Bachelor's degree (e.g., BA, AB, BS)  Occupational History    Comment: Unemployed   Occupation: Retired  Tobacco Use   Smoking status: Never   Smokeless tobacco: Never  Vaping Use   Vaping Use: Never used  Substance and Sexual Activity   Alcohol use: Yes    Comment: Occasional   Drug use: No    Sexual activity: Not on file  Other Topics Concern   Not on file  Social History Narrative   Lives with his wife. He enjoys playing pickle ball.   Social Determinants of Health   Financial Resource Strain: Low Risk  (10/03/2022)   Overall Financial Resource Strain (CARDIA)    Difficulty of Paying Living Expenses: Not hard at all  Food Insecurity: No Food Insecurity (10/03/2022)   Hunger Vital Sign    Worried About Running Out of Food in the Last Year: Never true    Ran Out of Food in the Last Year: Never true  Transportation Needs: No Transportation Needs (10/03/2022)   PRAPARE - Hydrologist (Medical): No    Lack of Transportation (Non-Medical): No  Physical Activity: Sufficiently Active (10/03/2022)   Exercise Vital Sign    Days of Exercise per Week: 4 days    Minutes of Exercise per Session: 60 min  Stress: No Stress Concern Present (10/03/2022)   [place]    Feeling of Stress : Not at all  Social Connections: [place] (10/03/2022)   Social Connection and Isolation Panel [NHANES]    Frequency of Communication with Friends and Family: Twice a week    Frequency of Social Gatherings with Friends and Family: Twice a week    Attends Religious Services: More than 4 times per year    Active Member of [employer] or Organizations: Yes    Attends Archivist Meetings: More than 4 times per year    Marital Status: Married    Activities of Daily Living    10/03/2022    8:18 AM  In your present state of health, do you have any difficulty performing the following activities:  Hearing? 0  Vision? 0  Difficulty concentrating or making decisions? 0  Walking or climbing stairs? 0  Dressing or bathing? 0  Doing errands, shopping? 0  Preparing Food and eating ? N  Using the Toilet? N  In the past six months, have you accidently leaked urine? N  Do you have problems with loss  of bowel control? N  Managing your Medications? N  Managing your Finances? N  Housekeeping or managing your Housekeeping? N    Patient Education/Literacy How often do you need to have someone help you when you read instructions, pamphlets, or other written materials from your doctor or pharmacy?: 1 - Never What is the last grade level you completed in school?: Bachelor's degree  Exercise Current Exercise Habits: Home exercise routine, Type of exercise: Other - see comments (pickle ball), Time (Minutes): 60, Frequency (Times/Week): 4, Weekly Exercise (Minutes/Week): 240, Intensity: Moderate, Exercise  limited by: None identified  Diet Patient reports consuming  2-3  meals a day and 0.5 snack(s) a day Patient reports that his primary diet is: Regular Patient reports that she does have regular access to food.   Depression Screen    10/03/2022    8:14 AM 05/12/2021   11:00 AM 11/26/2018    2:35 PM 10/11/2017   11:01 AM 07/10/2017    9:43 AM  PHQ 2/9 Scores  PHQ - 2 Score 0 1 2 0 0  PHQ- 9 Score   6       Fall Risk    10/03/2022    8:15 AM 05/12/2021   11:00 AM 07/06/2017    8:37 AM  Fall Risk   Falls in the past year? 0 0 No  Number falls in past yr: 0 0   Injury with Fall? 0    Risk for fall due to : No Fall Risks    Follow up Falls evaluation completed Falls evaluation completed      Objective:   BP (!) 145/73 (BP Location: Right Arm, Patient Position: Sitting, Cuff Size: Large)   Pulse (!) 56   Ht '5\' 8"'$  (1.727 m)   Wt 212 lb 1.3 oz (96.2 kg)   SpO2 96%   BMI 32.25 kg/m   Last Weight  Most recent update: 10/03/2022  8:08 AM    Weight  96.2 kg (212 lb 1.3 oz)             Body mass index is 32.25 kg/m.  Hearing/Vision  Krist did not have difficulty with hearing/understanding during the face-to-face interview Arseniy did not have difficulty with his vision during the face-to-face interview Reports that he has not had a formal eye exam by an eye care professional  within the past year Reports that he has not had a formal hearing evaluation within the past year  Cognitive Function:    10/03/2022    8:23 AM  6CIT Screen  What Year? 0 points  What month? 0 points  What time? 0 points  Count back from 20 0 points  Months in reverse 0 points  Repeat phrase 0 points  Total Score 0 points    Normal Cognitive Function Screening: Yes (Normal:0-7, Significant for Dysfunction: >8)  Immunization & Health Maintenance Record Immunization History  Administered Date(s) Administered   PFIZER(Purple Top)SARS-COV-2 Vaccination 04/27/2020, 06/13/2020   Tdap 08/24/2012   Zoster Recombinat (Shingrix) 05/14/2021, 07/20/2021    Health Maintenance  Topic Date Due   COVID-19 Vaccine (3 - Pfizer risk series) 10/19/2022 (Originally 07/11/2020)   Pneumonia Vaccine 14+ Years old (1 - PCV) 10/04/2023 (Originally 07/26/2021)   COLONOSCOPY (Pts 45-28yr Insurance coverage will need to be confirmed)  10/15/2022   Medicare Annual Wellness (AWV)  10/04/2023   Hepatitis C Screening  Completed   Zoster Vaccines- Shingrix  Completed   HPV VACCINES  Aged Out       Assessment  This is a routine wellness examination for KAlbertson's  Health Maintenance: Due or Overdue There are no preventive care reminders to display for this patient.   KTerri Piedradoes not need a referral for Community Assistance: Care Management:   no Social Work:    no Prescription Assistance:  no Nutrition/Diabetes Education:  no   Plan:  Personalized Goals  Goals Addressed               This Visit's Progress     Patient Stated (pt-stated)  Patient stated that he would like to loose 13 lbs and eat a more healthier.       Personalized Health Maintenance & Screening Recommendations  Pneumococcal vaccine   Lung Cancer Screening Recommended: no (Low Dose CT Chest recommended if Age 28-80 years, 30 pack-year currently smoking OR have quit w/in past 15 years) Hepatitis C  Screening recommended: no HIV Screening recommended: no  Advanced Directives: Written information was not given per the patient's request.  Referrals & Orders Orders Placed This Encounter  Procedures   Ambulatory referral to Gastroenterology (for Colonoscopy)    Follow-up Plan Follow-up with Silverio Decamp, MD as planned Discuss pneumonia vaccine with PCP next week. Medicare wellness visit in one year.  AVS printed and given to the patient.    I have personally reviewed and noted the following in the patient's chart:   Medical and social history Use of alcohol, tobacco or illicit drugs  Current medications and supplements Functional ability and status Nutritional status Physical activity Advanced directives List of other physicians Hospitalizations, surgeries, and ER visits in previous 12 months Vitals Screenings to include cognitive, depression, and falls Referrals and appointments  In addition, I have reviewed and discussed with patient certain preventive protocols, quality metrics, and best practice recommendations. A written personalized care plan for preventive services as well as general preventive health recommendations were provided to patient.     Tinnie Gens, RN BSN  10/03/2022

## 2022-10-03 NOTE — Patient Instructions (Addendum)
[place] Maintenance Summary and Written Plan of Care  [name] ,  Thank you for allowing me to perform your Medicare Annual Wellness Visit and for your ongoing commitment to your health.   Health Maintenance & Immunization History Health Maintenance  Topic Date Due   COVID-19 Vaccine (3 - Pfizer risk series) 10/19/2022 (Originally 07/11/2020)   Pneumonia Vaccine 58+ Years old (1 - PCV) 10/04/2023 (Originally 07/26/2021)   COLONOSCOPY (Pts 45-44yr Insurance coverage will need to be confirmed)  10/15/2022   Medicare Annual Wellness (AWV)  10/04/2023   Hepatitis C Screening  Completed   Zoster Vaccines- Shingrix  Completed   HPV VACCINES  Aged Out   Immunization History  Administered Date(s) Administered   PFIZER(Purple Top)SARS-COV-2 Vaccination 04/27/2020, 06/13/2020   Tdap 08/24/2012   Zoster Recombinat (Shingrix) 05/14/2021, 07/20/2021    These are the patient goals that we discussed:  Goals Addressed               This Visit's Progress     Patient Stated (pt-stated)        Patient stated that he would like to loose 13 lbs and eat a more healthier.         This is a list of Health Maintenance Items that are overdue or due now: Pneumococcal vaccine There are no preventive care reminders to display for this patient.    Orders/Referrals Placed Today: Orders Placed This Encounter  Procedures   Ambulatory referral to Gastroenterology (for Colonoscopy)    Referral Priority:   Routine    Referral Type:   Consultation    Referral Reason:   Specialty Services Required    Number of Visits Requested:   1   (Contact our referral department at [phone] you have not spoken with someone about your referral appointment within the next 5 days)    Follow-up Plan Follow-up with TSilverio Decamp MD as planned Discuss pneumonia vaccine with PCP next week. Medicare wellness visit in one year.  AVS printed and given to the patient.        Health Maintenance, Male Adopting a healthy lifestyle and getting preventive care are important in promoting health and wellness. Ask your health care provider about: The right schedule for you to have regular tests and exams. Things you can do on your own to prevent diseases and keep yourself healthy. What should I know about diet, weight, and exercise? Eat a healthy diet  Eat a diet that includes plenty of vegetables, fruits, low-fat dairy products, and lean protein. Do not eat a lot of foods that are high in solid fats, added sugars, or sodium. Maintain a healthy weight Body mass index (BMI) is a measurement that can be used to identify possible weight problems. It estimates body fat based on height and weight. Your health care provider can help determine your BMI and help you achieve or maintain a healthy weight. Get regular exercise Get regular exercise. This is one of the most important things you can do for your health. Most adults should: Exercise for at least 150 minutes each week. The exercise should increase your heart rate and make you sweat (moderate-intensity exercise). Do strengthening exercises at least twice a week. This is in addition to the moderate-intensity exercise. Spend less time sitting. Even light physical activity can be beneficial. Watch cholesterol and blood lipids Have your blood tested for lipids and cholesterol at 66years of age, then have this test every 5 years. You may  need to have your cholesterol levels checked more often if: Your lipid or cholesterol levels are high. You are older than 66 years of age. You are at high risk for heart disease. What should I know about cancer screening? Many types of cancers can be detected early and may often be prevented. Depending on your health history and family history, you may need to have cancer screening at various ages. This may include screening for: Colorectal cancer. Prostate cancer. Skin  cancer. Lung cancer. What should I know about heart disease, diabetes, and high blood pressure? Blood pressure and heart disease High blood pressure causes heart disease and increases the risk of stroke. This is more likely to develop in people who have high blood pressure readings or are overweight. Talk with your health care provider about your target blood pressure readings. Have your blood pressure checked: Every 3-5 years if you are 64-75 years of age. Every year if you are 33 years old or older. If you are between the ages of 62 and 64 and are a current or former smoker, ask your health care provider if you should have a one-time screening for abdominal aortic aneurysm (AAA). Diabetes Have regular diabetes screenings. This checks your fasting blood sugar level. Have the screening done: Once every three years after age 61 if you are at a normal weight and have a low risk for diabetes. More often and at a younger age if you are overweight or have a high risk for diabetes. What should I know about preventing infection? Hepatitis B If you have a higher risk for hepatitis B, you should be screened for this virus. Talk with your health care provider to find out if you are at risk for hepatitis B infection. Hepatitis C Blood testing is recommended for: Everyone born from [birth year] through 1965. Anyone with known risk factors for hepatitis C. Sexually transmitted infections (STIs) You should be screened each year for STIs, including gonorrhea and chlamydia, if: You are sexually active and are younger than 66 years of age. You are older than 66 years of age and your health care provider tells you that you are at risk for this type of infection. Your sexual activity has changed since you were last screened, and you are at increased risk for chlamydia or gonorrhea. Ask your health care provider if you are at risk. Ask your health care provider about whether you are at high risk for HIV. Your health  care provider may recommend a prescription medicine to help prevent HIV infection. If you choose to take medicine to prevent HIV, you should first get tested for HIV. You should then be tested every 3 months for as long as you are taking the medicine. Follow these instructions at home: Alcohol use Do not drink alcohol if your health care provider tells you not to drink. If you drink alcohol: Limit how much you have to 0-2 drinks a day. Know how much alcohol is in your drink. In the [place] one drink equals one 12 oz bottle of beer (355 mL), one 5 oz glass of wine (148 mL), or one 1 oz glass of hard liquor (44 mL). Lifestyle Do not use any products that contain nicotine or tobacco. These products include cigarettes, chewing tobacco, and vaping devices, such as e-cigarettes. If you need help quitting, ask your health care provider. Do not use street drugs. Do not share needles. Ask your health care provider for help if you need support or information about quitting drugs.  General instructions Schedule regular health, dental, and eye exams. Stay current with your vaccines. Tell your health care provider if: You often feel depressed. You have ever been abused or do not feel safe at home. Summary Adopting a healthy lifestyle and getting preventive care are important in promoting health and wellness. Follow your health care provider's instructions about healthy diet, exercising, and getting tested or screened for diseases. Follow your health care provider's instructions on monitoring your cholesterol and blood pressure. This information is not intended to replace advice given to you by your health care provider. Make sure you discuss any questions you have with your health care provider. Document Revised: 03/22/2021 Document Reviewed: 03/22/2021 Elsevier Patient Education  [place].

## 2022-10-10 ENCOUNTER — Telehealth: Payer: Self-pay | Admitting: Cardiology

## 2022-10-10 NOTE — Telephone Encounter (Signed)
Spoke to patient, confirmed medication and dosages.     He reports BP has been slightly elevated 575-051G systolic, HR 33P.  He states he has an appt with his PCP tomorrow and will discuss with him.  Advised if any questions/concerns to call back.

## 2022-10-10 NOTE — Telephone Encounter (Signed)
Pt c/o medication issue:  1. Name of Medication:  atorvastatin (LIPITOR) 80 MG tablet metoprolol succinate (TOPROL XL) 25 MG 24 hr tablet  2. How are you currently taking this medication (dosage and times per day)?   3. Are you having a reaction (difficulty breathing--STAT)?   4. What is your medication issue?   Patient would like to clarify medication instructions.

## 2022-10-11 ENCOUNTER — Encounter: Payer: Self-pay | Admitting: Sports Medicine

## 2022-10-11 ENCOUNTER — Ambulatory Visit (INDEPENDENT_AMBULATORY_CARE_PROVIDER_SITE_OTHER): Payer: Medicare HMO | Admitting: Sports Medicine

## 2022-10-11 VITALS — BP 160/85 | HR 54 | Ht 68.0 in | Wt 215.0 lb

## 2022-10-11 DIAGNOSIS — N139 Obstructive and reflux uropathy, unspecified: Secondary | ICD-10-CM

## 2022-10-11 DIAGNOSIS — R7309 Other abnormal glucose: Secondary | ICD-10-CM | POA: Diagnosis not present

## 2022-10-11 DIAGNOSIS — Z23 Encounter for immunization: Secondary | ICD-10-CM

## 2022-10-11 DIAGNOSIS — Z0000 Encounter for general adult medical examination without abnormal findings: Secondary | ICD-10-CM | POA: Diagnosis not present

## 2022-10-11 DIAGNOSIS — I2510 Atherosclerotic heart disease of native coronary artery without angina pectoris: Secondary | ICD-10-CM

## 2022-10-11 DIAGNOSIS — N63 Unspecified lump in unspecified breast: Secondary | ICD-10-CM | POA: Diagnosis not present

## 2022-10-11 DIAGNOSIS — I10 Essential (primary) hypertension: Secondary | ICD-10-CM | POA: Diagnosis not present

## 2022-10-11 DIAGNOSIS — M7672 Peroneal tendinitis, left leg: Secondary | ICD-10-CM | POA: Diagnosis not present

## 2022-10-11 MED ORDER — AMLODIPINE BESYLATE 5 MG PO TABS: 5.0000 mg | ORAL_TABLET | Freq: Every day | ORAL | 3 refills | Status: DC

## 2022-10-11 NOTE — Assessment & Plan Note (Signed)
Mild pain lateral left ankle after playing pickle ball, adding some peroneal stretches.

## 2022-10-11 NOTE — Assessment & Plan Note (Signed)
Having several episodes of nocturia as well as considering today, he was not consistent with his Flomax, he will restart Flomax and do this for a month before reevaluation.

## 2022-10-11 NOTE — Addendum Note (Signed)
Addended by: Tarri Glenn A on: 10/11/2022 11:08 AM   Modules accepted: Orders

## 2022-10-11 NOTE — Assessment & Plan Note (Signed)
Dominic Ray is also noticing additional bilateral breast masses, lumpiness, grandfather died of breast cancer, ordering bilateral diagnostic ultrasound and mammogram.

## 2022-10-11 NOTE — Addendum Note (Signed)
Addended by: Silverio Decamp on: 10/11/2022 11:05 AM   Modules accepted: Orders

## 2022-10-11 NOTE — Assessment & Plan Note (Signed)
Blood pressure unfortunately continues to be elevated continue half tab of Toprol XL, adding amlodipine 5. He will let me know his blood pressures over the next couple weeks.

## 2022-10-11 NOTE — Progress Notes (Addendum)
Subjective:    CC: Annual Physical Exam  HPI:  This patient is here for their annual physical  I reviewed the past medical history, family history, social history, surgical history, and allergies today and no changes were needed.  Please see the problem list section below in epic for further details.  Past Medical History: Past Medical History:  Diagnosis Date   Basal cell carcinoma of nasal tip 08/24/2012   Coronary artery disease    a. Abnl stress test -> s/p CABGx4 in 04/2017 with LIMA-LAD, SVG-diag, SVG-AM, SVG-PDA.   Hyperlipidemia    Hypertension    Male hypogonadism 08/24/2012   Onychomycosis 08/24/2012   Past Surgical History: Past Surgical History:  Procedure Laterality Date   CARDIAC CATHETERIZATION  05/08/2017   CARDIOVASCULAR STRESS TEST  04/2017   CORONARY ARTERY BYPASS GRAFT N/A 05/09/2017   Procedure: CORONARY ARTERY BYPASS GRAFTING times four using left internal mammary artery and right leg saphenous vein  ;  Surgeon: Gaye Pollack, MD;  Location: [place];  Service: Open Heart Surgery;  Laterality: N/A;   LEFT HEART CATH AND CORONARY ANGIOGRAPHY N/A 05/08/2017   Procedure: Left Heart Cath and Coronary Angiography;  Surgeon: Troy Sine, MD;  Location: [place] [facility];  Service: Cardiovascular;  Laterality: N/A;   TEE WITHOUT CARDIOVERSION N/A 05/09/2017   Procedure: TRANSESOPHAGEAL ECHOCARDIOGRAM (TEE);  Surgeon: Gaye Pollack, MD;  Location: [place];  Service: Open Heart Surgery;  Laterality: N/A;   TONSILLECTOMY  1968   Social History: Social History   Socioeconomic History   Marital status: Married    Spouse name: [name]   Number of children: 0   Years of education: 16   Highest education level: Bachelor's degree (e.g., BA, AB, BS)  Occupational History    Comment: Unemployed   Occupation: Retired  Tobacco Use   Smoking status: Never   Smokeless tobacco: Never  Vaping Use   Vaping Use: Never used  Substance and Sexual Activity   Alcohol use:  Yes    Comment: Occasional   Drug use: No   Sexual activity: Not on file  Other Topics Concern   Not on file  Social History Narrative   Lives with his wife. He enjoys playing pickle ball.   Social Determinants of Health   Financial Resource Strain: Low Risk  (10/03/2022)   Overall Financial Resource Strain (CARDIA)    Difficulty of Paying Living Expenses: Not hard at all  Food Insecurity: No Food Insecurity (10/03/2022)   Hunger Vital Sign    Worried About Running Out of Food in the Last Year: Never true    Ran Out of Food in the Last Year: Never true  Transportation Needs: No Transportation Needs (10/03/2022)   PRAPARE - Hydrologist (Medical): No    Lack of Transportation (Non-Medical): No  Physical Activity: Sufficiently Active (10/03/2022)   Exercise Vital Sign    Days of Exercise per Week: 4 days    Minutes of Exercise per Session: 60 min  Stress: No Stress Concern Present (10/03/2022)   [place]    Feeling of Stress : Not at all  Social Connections: [place] (10/03/2022)   Social Connection and Isolation Panel [NHANES]    Frequency of Communication with Friends and Family: Twice a week    Frequency of Social Gatherings with Friends and Family: Twice a week    Attends Religious Services: More than 4 times per year  Active Member of Clubs or Organizations: Yes    Attends Music therapist: More than 4 times per year    Marital Status: Married   Family History: Family History  Problem Relation Age of Onset   CAD Father        Died of MI at age 18   Cancer Mother    Colon cancer Neg Hx    Stomach cancer Neg Hx    Allergies: Allergies  Allergen Reactions   Anesthetics, Amide    [name] [Lidocaine] Rash    Anything ending in (-caine) per patient    Medications: See med rec.  Review of Systems: No headache, visual changes, nausea, vomiting,  diarrhea, constipation, dizziness, abdominal pain, skin rash, fevers, chills, night sweats, swollen lymph nodes, weight loss, chest pain, body aches, joint swelling, muscle aches, shortness of breath, mood changes, visual or auditory hallucinations.  Objective:    General: Well Developed, well nourished, and in no acute distress.  Neuro: Alert and oriented x3, extra-ocular muscles intact, sensation grossly intact. Cranial nerves II through XII are intact, motor, sensory, and coordinative functions are all intact. HEENT: Normocephalic, atraumatic, pupils equal round reactive to light, neck supple, no masses, no lymphadenopathy, thyroid nonpalpable. Oropharynx, nasopharynx, external ear canals are unremarkable. Skin: Warm and dry, no rashes noted.  Cardiac: Regular rate and rhythm, no murmurs rubs or gallops.  Respiratory: Clear to auscultation bilaterally. Not using accessory muscles, speaking in full sentences.  Abdominal: Soft, nontender, nondistended, positive bowel sounds, no masses, no organomegaly.  Musculoskeletal: Shoulder, elbow, wrist, hip, knee, ankle stable, and with full range of motion.  Impression and Recommendations:    The patient was counselled, risk factors were discussed, anticipatory guidance given.  Annual physical exam Annual physical as above, Tdap and Prevnar 20 today. Due for colonoscopy, referral to  GI.   Peroneal tendinitis, left Mild pain lateral left ankle after playing pickle ball, adding some peroneal stretches.  Obstructive uropathy Having several episodes of nocturia as well as considering today, he was not consistent with his Flomax, he will restart Flomax and do this for a month before reevaluation.  Breast mass in male Theodore is also noticing additional bilateral breast masses, lumpiness, grandfather died of breast cancer, ordering bilateral diagnostic ultrasound and mammogram.  Hypertension Blood pressure unfortunately continues to be  elevated continue half tab of Toprol XL, adding amlodipine 5. He will let me know his blood pressures over the next couple weeks.   ____________________________________________ Gwen Her. Dianah Field, M.D., ABFM., CAQSM., AME. Primary Care and Sports Medicine [facility]  Adjunct Professor of [place] of [facility] of Medicine  Risk manager

## 2022-10-11 NOTE — Assessment & Plan Note (Signed)
Annual physical as above, Tdap and Prevnar 20 today. Due for colonoscopy, referral to [facility].

## 2022-10-13 ENCOUNTER — Encounter: Payer: Self-pay | Admitting: Gastroenterology

## 2022-10-16 LAB — COMPLETE METABOLIC PANEL WITH GFR
AG Ratio: 1.2 (calc) (ref 1.0–2.5)
ALT: 24 U/L (ref 9–46)
AST: 22 U/L (ref 10–35)
Albumin: 4.1 g/dL (ref 3.6–5.1)
Alkaline phosphatase (APISO): 90 U/L (ref 35–144)
BUN: 21 mg/dL (ref 7–25)
CO2: 30 mmol/L (ref 20–32)
Calcium: 9.2 mg/dL (ref 8.6–10.3)
Chloride: 106 mmol/L (ref 98–110)
Creat: 0.92 mg/dL (ref 0.70–1.35)
Globulin: 3.5 g/dL (calc) (ref 1.9–3.7)
Glucose, Bld: 88 mg/dL (ref 65–99)
Potassium: 4.7 mmol/L (ref 3.5–5.3)
Sodium: 142 mmol/L (ref 135–146)
Total Bilirubin: 0.8 mg/dL (ref 0.2–1.2)
Total Protein: 7.6 g/dL (ref 6.1–8.1)
eGFR: 92 mL/min/{1.73_m2} (ref >=60)

## 2022-10-16 LAB — HEMOGLOBIN A1C
Hgb A1c MFr Bld: 5.7 % of total Hgb — ABNORMAL HIGH (ref <5.7)
Mean Plasma Glucose: 117 mg/dL
eAG (mmol/L): 6.5 mmol/L

## 2022-10-16 LAB — LIPID PANEL
Cholesterol: 122 mg/dL (ref <200)
HDL: 45 mg/dL (ref >=40)
LDL Cholesterol (Calc): 59 mg/dL (calc)
Non-HDL Cholesterol (Calc): 77 mg/dL (calc) (ref <130)
Total CHOL/HDL Ratio: 2.7 (calc) (ref <5.0)
Triglycerides: 93 mg/dL (ref <150)

## 2022-10-16 LAB — CBC
HCT: 47.1 % (ref 38.5–50.0)
Hemoglobin: 15.6 g/dL (ref 13.2–17.1)
MCH: 28.6 pg (ref 27.0–33.0)
MCHC: 33.1 g/dL (ref 32.0–36.0)
MCV: 86.3 fL (ref 80.0–100.0)
MPV: 11.7 fL (ref 7.5–12.5)
Platelets: 234 10*3/uL (ref 140–400)
RBC: 5.46 10*6/uL (ref 4.20–5.80)
RDW: 13.1 % (ref 11.0–15.0)
WBC: 6.7 10*3/uL (ref 3.8–10.8)

## 2022-10-16 LAB — TESTOSTERONE, FREE & TOTAL
Free Testosterone: 45.8 pg/mL (ref 35.0–155.0)
Testosterone, Total, LC-MS-MS: 327 ng/dL (ref 250–1100)

## 2022-10-16 LAB — PSA, TOTAL AND FREE
PSA, % Free: 13 % (calc) — ABNORMAL LOW (ref >25)
PSA, Free: 0.3 ng/mL
PSA, Total: 2.3 ng/mL (ref <=4.0)

## 2022-10-16 LAB — TSH: TSH: 1.37 mIU/L (ref 0.40–4.50)

## 2022-10-27 DIAGNOSIS — Z135 Encounter for screening for eye and ear disorders: Secondary | ICD-10-CM | POA: Diagnosis not present

## 2022-10-27 DIAGNOSIS — H524 Presbyopia: Secondary | ICD-10-CM | POA: Diagnosis not present

## 2022-10-27 DIAGNOSIS — H52223 Regular astigmatism, bilateral: Secondary | ICD-10-CM | POA: Diagnosis not present

## 2022-10-27 DIAGNOSIS — H5203 Hypermetropia, bilateral: Secondary | ICD-10-CM | POA: Diagnosis not present

## 2022-11-14 DIAGNOSIS — C679 Malignant neoplasm of bladder, unspecified: Secondary | ICD-10-CM

## 2022-11-14 HISTORY — DX: Malignant neoplasm of bladder, unspecified: C67.9

## 2022-11-16 ENCOUNTER — Ambulatory Visit (INDEPENDENT_AMBULATORY_CARE_PROVIDER_SITE_OTHER): Payer: Medicare HMO | Admitting: Sports Medicine

## 2022-11-16 ENCOUNTER — Encounter: Payer: Self-pay | Admitting: Sports Medicine

## 2022-11-16 VITALS — BP 130/80 | HR 86 | Wt 211.0 lb

## 2022-11-16 DIAGNOSIS — I10 Essential (primary) hypertension: Secondary | ICD-10-CM | POA: Diagnosis not present

## 2022-11-16 DIAGNOSIS — Z0000 Encounter for general adult medical examination without abnormal findings: Secondary | ICD-10-CM | POA: Diagnosis not present

## 2022-11-16 DIAGNOSIS — N63 Unspecified lump in unspecified breast: Secondary | ICD-10-CM

## 2022-11-16 DIAGNOSIS — N139 Obstructive and reflux uropathy, unspecified: Secondary | ICD-10-CM

## 2022-11-16 NOTE — Assessment & Plan Note (Signed)
Doing much better with the addition of Flomax, he is happy with his current residual symptomatology no changes.

## 2022-11-16 NOTE — Assessment & Plan Note (Signed)
Continue one half tab Toprol-XL twice daily, amlodipine 5, blood pressure has improved considerably. He gave me his diary and they are generally in the 120s over 80s at home.

## 2022-11-16 NOTE — Assessment & Plan Note (Signed)
Dominic Ray is up-to-date on screening measures. We did his referral to [facility] at the last visit.

## 2022-11-16 NOTE — Progress Notes (Addendum)
    Procedures performed today:    None.  Independent interpretation of notes and tests performed by another provider:   None.  Brief History, Exam, Impression, and Recommendations:    Annual physical exam Dominic Ray is up-to-date on screening measures. We did his referral to [facility] at the last visit.  Hypertension Continue one half tab Toprol-XL twice daily, amlodipine 5, blood pressure has improved considerably. He gave me his diary and they are generally in the 120s over 80s at home.  Obstructive uropathy Doing much better with the addition of Flomax, he is happy with his current residual symptomatology no changes.  Breast mass in male Ultrasound and mammography show a benign intramammary lymph node, annual breast cancer screening with mammogram is recommended considering his family history of breast cancer.   ____________________________________________ Gwen Her. Dianah Field, M.D., ABFM., CAQSM., AME. Primary Care and Sports Medicine [facility]  Adjunct Professor of [place] of [facility] of Medicine  Risk manager

## 2022-11-17 ENCOUNTER — Ambulatory Visit (AMBULATORY_SURGERY_CENTER): Payer: Medicare HMO | Admitting: *Deleted

## 2022-11-17 VITALS — Ht 68.0 in | Wt 211.0 lb

## 2022-11-17 DIAGNOSIS — Z1211 Encounter for screening for malignant neoplasm of colon: Secondary | ICD-10-CM

## 2022-11-17 MED ORDER — NA SULFATE-K SULFATE-MG SULF 17.5-3.13-1.6 GM/177ML PO SOLN: 1.0000 | Freq: Once | ORAL | 0 refills | Status: AC

## 2022-11-17 NOTE — Progress Notes (Signed)

## 2022-12-05 ENCOUNTER — Encounter: Payer: Self-pay | Admitting: Gastroenterology

## 2022-12-06 ENCOUNTER — Encounter: Payer: Self-pay | Admitting: Gastroenterology

## 2022-12-06 ENCOUNTER — Ambulatory Visit (AMBULATORY_SURGERY_CENTER): Payer: Medicare HMO | Admitting: Gastroenterology

## 2022-12-06 VITALS — BP 114/64 | HR 68 | Temp 98.6°F | Resp 17 | Ht 68.0 in | Wt 211.0 lb

## 2022-12-06 DIAGNOSIS — I10 Essential (primary) hypertension: Secondary | ICD-10-CM | POA: Diagnosis not present

## 2022-12-06 DIAGNOSIS — E785 Hyperlipidemia, unspecified: Secondary | ICD-10-CM | POA: Diagnosis not present

## 2022-12-06 DIAGNOSIS — Z1211 Encounter for screening for malignant neoplasm of colon: Secondary | ICD-10-CM | POA: Diagnosis not present

## 2022-12-06 DIAGNOSIS — I2510 Atherosclerotic heart disease of native coronary artery without angina pectoris: Secondary | ICD-10-CM | POA: Diagnosis not present

## 2022-12-06 DIAGNOSIS — D124 Benign neoplasm of descending colon: Secondary | ICD-10-CM | POA: Diagnosis not present

## 2022-12-06 HISTORY — PX: COLONOSCOPY: SHX174

## 2022-12-06 MED ORDER — SODIUM CHLORIDE 0.9 % IV SOLN: 500.0000 mL | Freq: Once | INTRAVENOUS | Status: DC

## 2022-12-06 NOTE — Op Note (Signed)
[facility] Patient Name: Dominic Ray Procedure Date: 12/06/2022 8:29 AM MRN: [id number] Endoscopist: Nicki Reaper E. Candis Schatz , MD, [id number] Age: 67 Referring MD:  Date of Birth: [birth year] Gender: Male Account #: [id number] Procedure:                Colonoscopy Indications:              Screening for colorectal malignant neoplasm (last                            colonoscopy was 10 years ago) Medicines:                Monitored Anesthesia Care Procedure:                Pre-Anesthesia Assessment:                           - Prior to the procedure, a History and Physical                            was performed, and patient medications and                            allergies were reviewed. The patient's tolerance of                            previous anesthesia was also reviewed. The risks                            and benefits of the procedure and the sedation                            options and risks were discussed with the patient.                            All questions were answered, and informed consent                            was obtained. Prior Anticoagulants: The patient has                            taken no anticoagulant or antiplatelet agents                            except for aspirin. ASA Grade Assessment: III - A                            patient with severe systemic disease. After                            reviewing the risks and benefits, the patient was                            deemed in satisfactory condition to undergo the  procedure.                           After obtaining informed consent, the colonoscope                            was passed under direct vision. Throughout the                            procedure, the patient's blood pressure, pulse, and                            oxygen saturations were monitored continuously. The                            CF [id number] #[id number] was introduced through the anus                             and advanced to the the terminal ileum, with                            identification of the appendiceal orifice and IC                            valve. The colonoscopy was performed without                            difficulty. The patient tolerated the procedure                            well. The quality of the bowel preparation was                            good. The terminal ileum, ileocecal valve,                            appendiceal orifice, and rectum were photographed.                            The bowel preparation used was SUPREP via split                            dose instruction. Scope In: 8:42:19 AM Scope Out: 8:55:40 AM Scope Withdrawal Time: 0 hours 9 minutes 50 seconds  Total Procedure Duration: 0 hours 13 minutes 21 seconds  Findings:                 The perianal and digital rectal examinations were                            normal. Pertinent negatives include normal                            sphincter tone and no palpable rectal lesions.  A 5 mm polyp was found in the descending colon. The                            polyp was flat. The polyp was removed with a cold                            snare. Resection and retrieval were complete.                            Estimated blood loss was minimal.                           The exam was otherwise normal throughout the                            examined colon.                           The terminal ileum appeared normal.                           The retroflexed view of the distal rectum and anal                            verge was normal and showed no anal or rectal                            abnormalities. Complications:            No immediate complications. Estimated Blood Loss:     Estimated blood loss was minimal. Impression:               - One 5 mm polyp in the descending colon, removed                            with a cold snare. Resected and  retrieved.                           - The examined portion of the ileum was normal.                           - The distal rectum and anal verge are normal on                            retroflexion view. Recommendation:           - Patient has a contact number available for                            emergencies. The signs and symptoms of potential                            delayed complications were discussed with the  patient. Return to normal activities tomorrow.                            Written discharge instructions were provided to the                            patient.                           - Resume previous diet.                           - Continue present medications.                           - Await pathology results.                           - Repeat colonoscopy (date not yet determined) for                            surveillance based on pathology results. Tristen Pennino E. Candis Schatz, MD 12/06/2022 8:59:41 AM This report has been signed electronically.

## 2022-12-06 NOTE — Progress Notes (Signed)
Called to room to assist during endoscopic procedure.  Patient ID and intended procedure confirmed with present staff. Received instructions for my participation in the procedure from the performing physician.

## 2022-12-06 NOTE — Progress Notes (Signed)
To pacu, VSS. Report to [email or web address].tb 

## 2022-12-06 NOTE — Progress Notes (Signed)
Pt's states no medical or surgical changes since previsit or office visit. 

## 2022-12-06 NOTE — Patient Instructions (Signed)
Thank your for letting us take care of your healthcare needs today. Please see handouts given to you on Polyps.    YOU HAD AN ENDOSCOPIC PROCEDURE TODAY AT [facility]:   Refer to the procedure report that was given to you for any specific questions about what was found during the examination.  If the procedure report does not answer your questions, please call your gastroenterologist to clarify.  If you requested that your care partner not be given the details of your procedure findings, then the procedure report has been included in a sealed envelope for you to review at your convenience later.  YOU SHOULD EXPECT: Some feelings of bloating in the abdomen. Passage of more gas than usual.  Walking can help get rid of the air that was put into your GI tract during the procedure and reduce the bloating. If you had a lower endoscopy (such as a colonoscopy or flexible sigmoidoscopy) you may notice spotting of blood in your stool or on the toilet paper. If you underwent a bowel prep for your procedure, you may not have a normal bowel movement for a few days.  Please Note:  You might notice some irritation and congestion in your nose or some drainage.  This is from the oxygen used during your procedure.  There is no need for concern and it should clear up in a day or so.  SYMPTOMS TO REPORT IMMEDIATELY:  Following lower endoscopy (colonoscopy or flexible sigmoidoscopy):  Excessive amounts of blood in the stool  Significant tenderness or worsening of abdominal pains  Swelling of the abdomen that is new, acute  Fever of 100F or higher  Following upper endoscopy (EGD)  Vomiting of blood or coffee ground material  New chest pain or pain under the shoulder blades  Painful or persistently difficult swallowing  New shortness of breath  Fever of 100F or higher  Black, tarry-looking stools  For urgent or emergent issues, a gastroenterologist can be reached at any hour by calling [phone]  [phone]. Do not use MyChart messaging for urgent concerns.    DIET:  We do recommend a small meal at first, but then you may proceed to your regular diet.  Drink plenty of fluids but you should avoid alcoholic beverages for 24 hours.  ACTIVITY:  You should plan to take it easy for the rest of today and you should NOT DRIVE or use heavy machinery until tomorrow (because of the sedation medicines used during the test).    FOLLOW UP: Our staff will call the number listed on your records the next business day following your procedure.  We will call around 7:15- 8:00 am to check on you and address any questions or concerns that you may have regarding the information given to you following your procedure. If we do not reach you, we will leave a message.     If any biopsies were taken you will be contacted by phone or by letter within the next 1-3 weeks.  Please call us at [phone] if you have not heard about the biopsies in 3 weeks.    SIGNATURES/CONFIDENTIALITY: You and/or your care partner have signed paperwork which will be entered into your electronic medical record.  These signatures attest to the fact that that the information above on your After Visit Summary has been reviewed and is understood.  Full responsibility of the confidentiality of this discharge information lies with you and/or your care-partner.

## 2022-12-06 NOTE — Progress Notes (Signed)
[facility] History and Physical   Primary Care Physician:  Silverio Decamp, MD   Reason for Procedure:   Colon cancer screening  Plan:    Screening colonoscopy     HPI: Dominic Ray is a 67 y.o. male undergoing average risk screening colonoscopy.  He has no family history of colon cancer and no chronic GI symptoms. He had a normal colonoscopy in Dec 2013.   Past Medical History:  Diagnosis Date   Asthma    AS A CHILD   Basal cell carcinoma of nasal tip 08/24/2012   Coronary artery disease    a. Abnl stress test -> s/p CABGx4 in 04/2017 with LIMA-LAD, SVG-diag, SVG-AM, SVG-PDA.   Hyperlipidemia    Hypertension    Male hypogonadism 08/24/2012   Onychomycosis 08/24/2012    Past Surgical History:  Procedure Laterality Date   CARDIAC CATHETERIZATION  05/08/2017   CARDIOVASCULAR STRESS TEST  04/2017   CORONARY ARTERY BYPASS GRAFT N/A 05/09/2017   Procedure: CORONARY ARTERY BYPASS GRAFTING times four using left internal mammary artery and right leg saphenous vein  ;  Surgeon: Gaye Pollack, MD;  Location: [place];  Service: Open Heart Surgery;  Laterality: N/A;   LEFT HEART CATH AND CORONARY ANGIOGRAPHY N/A 05/08/2017   Procedure: Left Heart Cath and Coronary Angiography;  Surgeon: Troy Sine, MD;  Location: [place] [facility];  Service: Cardiovascular;  Laterality: N/A;   TEE WITHOUT CARDIOVERSION N/A 05/09/2017   Procedure: TRANSESOPHAGEAL ECHOCARDIOGRAM (TEE);  Surgeon: Gaye Pollack, MD;  Location: [place];  Service: Open Heart Surgery;  Laterality: N/A;   TONSILLECTOMY  1968    Prior to Admission medications   Medication Sig Start Date End Date Taking? Authorizing Provider  amLODipine (NORVASC) 5 MG tablet Take 1 tablet (5 mg total) by mouth daily. 10/11/22  Yes Silverio Decamp, MD  aspirin EC 81 MG tablet Take 1 tablet (81 mg total) by mouth daily. 05/02/18  Yes Lelon Perla, MD  atorvastatin (LIPITOR) 80 MG tablet TAKE 1 TABLET (80 MG TOTAL)  BY MOUTH DAILY. SCHEDULE OFFICE VISIT FOR FUTURE REFILLS. 01/24/22  Yes Lelon Perla, MD  metoprolol succinate (TOPROL XL) 25 MG 24 hr tablet Take 0.5 tablets (12.5 mg total) by mouth daily. 03/03/22  Yes Warren Lacy, PA-C  tamsulosin (FLOMAX) 0.4 MG CAPS capsule Take 1 capsule (0.4 mg total) by mouth daily. 11/16/22  Yes Silverio Decamp, MD    Current Outpatient Medications  Medication Sig Dispense Refill   amLODipine (NORVASC) 5 MG tablet Take 1 tablet (5 mg total) by mouth daily. 30 tablet 3   aspirin EC 81 MG tablet Take 1 tablet (81 mg total) by mouth daily. 90 tablet 3   atorvastatin (LIPITOR) 80 MG tablet TAKE 1 TABLET (80 MG TOTAL) BY MOUTH DAILY. SCHEDULE OFFICE VISIT FOR FUTURE REFILLS. 90 tablet 3   metoprolol succinate (TOPROL XL) 25 MG 24 hr tablet Take 0.5 tablets (12.5 mg total) by mouth daily. 90 tablet 3   tamsulosin (FLOMAX) 0.4 MG CAPS capsule Take 1 capsule (0.4 mg total) by mouth daily.     Current Facility-Administered Medications  Medication Dose Route Frequency Provider Last Rate Last Admin   0.9 %  sodium chloride infusion  500 mL Intravenous Once Daryel November, MD        Allergies as of 12/06/2022 - Review Complete 12/06/2022  Allergen Reaction Noted   Anesthetics, amide  05/12/2021   [name] [lidocaine] Rash 08/24/2012  Family History  Problem Relation Age of Onset   Cancer Mother    CAD Father        Died of MI at age 87   Colon cancer Neg Hx    Stomach cancer Neg Hx    Colon polyps Neg Hx    Crohn's disease Neg Hx    Esophageal cancer Neg Hx    Rectal cancer Neg Hx    Ulcerative colitis Neg Hx     Social History   Socioeconomic History   Marital status: Married    Spouse name: [name]   Number of children: 0   Years of education: 16   Highest education level: Bachelor's degree (e.g., BA, AB, BS)  Occupational History    Comment: Unemployed   Occupation: Retired  Tobacco Use   Smoking status: Never   Smokeless  tobacco: Never  Vaping Use   Vaping Use: Never used  Substance and Sexual Activity   Alcohol use: Yes    Comment: Occasional   Drug use: No   Sexual activity: Not on file  Other Topics Concern   Not on file  Social History Narrative   Lives with his wife. He enjoys playing pickle ball.   Social Determinants of Health   Financial Resource Strain: Low Risk  (10/03/2022)   Overall Financial Resource Strain (CARDIA)    Difficulty of Paying Living Expenses: Not hard at all  Food Insecurity: No Food Insecurity (10/03/2022)   Hunger Vital Sign    Worried About Running Out of Food in the Last Year: Never true    Ran Out of Food in the Last Year: Never true  Transportation Needs: No Transportation Needs (10/03/2022)   PRAPARE - Hydrologist (Medical): No    Lack of Transportation (Non-Medical): No  Physical Activity: Sufficiently Active (10/03/2022)   Exercise Vital Sign    Days of Exercise per Week: 4 days    Minutes of Exercise per Session: 60 min  Stress: No Stress Concern Present (10/03/2022)   [place]    Feeling of Stress : Not at all  Social Connections: [place] (10/03/2022)   Social Connection and Isolation Panel [NHANES]    Frequency of Communication with Friends and Family: Twice a week    Frequency of Social Gatherings with Friends and Family: Twice a week    Attends Religious Services: More than 4 times per year    Active Member of [employer] or Organizations: Yes    Attends Music therapist: More than 4 times per year    Marital Status: Married  Human resources officer Violence: Not At Risk (10/03/2022)   Humiliation, Afraid, Rape, and Kick questionnaire    Fear of Current or Ex-Partner: No    Emotionally Abused: No    Physically Abused: No    Sexually Abused: No    Review of Systems:  All other review of systems negative except as mentioned in the  HPI.  Physical Exam: Vital signs BP 132/70   Pulse 82   Temp 98.6 F (37 C)   Ht '5\' 8"'$  (1.727 m)   Wt 211 lb (95.7 kg)   SpO2 94%   BMI 32.08 kg/m   General:   Alert,  Well-developed, well-nourished, pleasant and cooperative in NAD Airway:  Mallampati 2 Lungs:  Clear throughout to auscultation.   Heart:  Regular rate and rhythm; 3/6 SEM at RUSB, no clicks, rubs,  or gallops. Abdomen:  Soft, nontender and nondistended. Normal bowel sounds.   Neuro/Psych:  Normal mood and affect. A and O x 3   Dominic Ray E. Candis Schatz, MD [facility]

## 2022-12-07 ENCOUNTER — Telehealth: Payer: Self-pay | Admitting: *Deleted

## 2022-12-07 NOTE — Telephone Encounter (Signed)
  Follow up Call-     12/06/2022    7:40 AM  Call back number  Post procedure Call Back phone  # [phone]  Permission to leave phone message Yes     Patient questions:  Do you have a fever, pain , or abdominal swelling? No. Pain Score  0 *  Have you tolerated food without any problems? Yes.    Have you been able to return to your normal activities? Yes.    Do you have any questions about your discharge instructions: Diet   No. Medications  No. Follow up visit  No.  Do you have questions or concerns about your Care? No.  Actions: * If pain score is 4 or above: No action needed, pain <4.

## 2022-12-12 ENCOUNTER — Ambulatory Visit
Admission: RE | Admit: 2022-12-12 | Discharge: 2022-12-12 | Disposition: A | Discharge status: Home / Self Care | Payer: Medicare HMO | Source: Ambulatory Visit | Attending: Sports Medicine | Admitting: Sports Medicine

## 2022-12-12 ENCOUNTER — Ambulatory Visit: Payer: Medicare HMO

## 2022-12-12 DIAGNOSIS — N63 Unspecified lump in unspecified breast: Secondary | ICD-10-CM

## 2022-12-12 DIAGNOSIS — N6489 Other specified disorders of breast: Secondary | ICD-10-CM | POA: Diagnosis not present

## 2022-12-12 DIAGNOSIS — R928 Other abnormal and inconclusive findings on diagnostic imaging of breast: Secondary | ICD-10-CM | POA: Diagnosis not present

## 2022-12-12 NOTE — Assessment & Plan Note (Signed)
Ultrasound and mammography show a benign intramammary lymph node, annual breast cancer screening with mammogram is recommended considering his family history of breast cancer.

## 2022-12-15 NOTE — Progress Notes (Signed)
Dominic Ray,  The polyp which I removed during your recent procedure was proven to be completely benign but is considered a "pre-cancerous" polyp that MAY have grown into cancer if it had not been removed.  Studies shows that at least 20% of women over age 67 and 30% of men over age 16 have pre-cancerous polyps.  Based on current nationally recognized surveillance guidelines, I recommend that you have a repeat colonoscopy in 7 years.   If you develop any new rectal bleeding, abdominal pain or significant bowel habit changes, please contact me before then.

## 2022-12-20 ENCOUNTER — Telehealth: Payer: Self-pay | Admitting: Cardiology

## 2022-12-20 NOTE — Telephone Encounter (Signed)
*  STAT* If patient is at the pharmacy, call can be transferred to refill team.   1. Which medications need to be refilled? (please list name of each medication and dose if known)  atorvastatin (LIPITOR) 80 MG tablet   2. Which pharmacy/location (including street and city if local pharmacy) is medication to be sent to? [id number]- [place], [place] - [id number]   3. Do they need a 30 day or 90 day supply? 90 day

## 2022-12-21 MED ORDER — ATORVASTATIN CALCIUM 80 MG PO TABS: 80.0000 mg | ORAL_TABLET | Freq: Every day | ORAL | 0 refills | Status: DC

## 2023-01-09 ENCOUNTER — Other Ambulatory Visit: Payer: Self-pay | Admitting: Sports Medicine

## 2023-01-09 DIAGNOSIS — I10 Essential (primary) hypertension: Secondary | ICD-10-CM

## 2023-03-14 DIAGNOSIS — Z6831 Body mass index (BMI) 31.0-31.9, adult: Secondary | ICD-10-CM | POA: Diagnosis not present

## 2023-03-14 DIAGNOSIS — I10 Essential (primary) hypertension: Secondary | ICD-10-CM | POA: Diagnosis not present

## 2023-03-14 DIAGNOSIS — Z85828 Personal history of other malignant neoplasm of skin: Secondary | ICD-10-CM | POA: Diagnosis not present

## 2023-03-14 DIAGNOSIS — Z008 Encounter for other general examination: Secondary | ICD-10-CM | POA: Diagnosis not present

## 2023-03-14 DIAGNOSIS — E669 Obesity, unspecified: Secondary | ICD-10-CM | POA: Diagnosis not present

## 2023-03-14 DIAGNOSIS — I2510 Atherosclerotic heart disease of native coronary artery without angina pectoris: Secondary | ICD-10-CM | POA: Diagnosis not present

## 2023-03-14 DIAGNOSIS — E785 Hyperlipidemia, unspecified: Secondary | ICD-10-CM | POA: Diagnosis not present

## 2023-03-14 DIAGNOSIS — Z803 Family history of malignant neoplasm of breast: Secondary | ICD-10-CM | POA: Diagnosis not present

## 2023-03-14 DIAGNOSIS — Z7982 Long term (current) use of aspirin: Secondary | ICD-10-CM | POA: Diagnosis not present

## 2023-03-16 ENCOUNTER — Other Ambulatory Visit: Payer: Self-pay | Admitting: Physician Assistant

## 2023-03-23 ENCOUNTER — Other Ambulatory Visit: Payer: Self-pay | Admitting: Cardiology

## 2023-05-01 DIAGNOSIS — M25552 Pain in left hip: Secondary | ICD-10-CM | POA: Diagnosis not present

## 2023-05-01 DIAGNOSIS — M7062 Trochanteric bursitis, left hip: Secondary | ICD-10-CM | POA: Diagnosis not present

## 2023-05-17 ENCOUNTER — Ambulatory Visit: Payer: Medicare HMO | Admitting: Sports Medicine

## 2023-05-23 ENCOUNTER — Ambulatory Visit (INDEPENDENT_AMBULATORY_CARE_PROVIDER_SITE_OTHER): Payer: Medicare HMO | Admitting: Sports Medicine

## 2023-05-23 ENCOUNTER — Ambulatory Visit: Payer: Medicare HMO

## 2023-05-23 ENCOUNTER — Encounter: Payer: Self-pay | Admitting: Sports Medicine

## 2023-05-23 VITALS — BP 154/68 | HR 69 | Ht 68.0 in | Wt 201.0 lb

## 2023-05-23 DIAGNOSIS — R319 Hematuria, unspecified: Secondary | ICD-10-CM

## 2023-05-23 DIAGNOSIS — R739 Hyperglycemia, unspecified: Secondary | ICD-10-CM | POA: Diagnosis not present

## 2023-05-23 DIAGNOSIS — R7309 Other abnormal glucose: Secondary | ICD-10-CM | POA: Diagnosis not present

## 2023-05-23 DIAGNOSIS — N419 Inflammatory disease of prostate, unspecified: Secondary | ICD-10-CM | POA: Diagnosis not present

## 2023-05-23 LAB — POCT URINALYSIS DIP (CLINITEK)
Glucose, UA: NEGATIVE mg/dL
Nitrite, UA: POSITIVE — AB
POC PROTEIN,UA: >=300 — AB
Spec Grav, UA: >=1.03 — AB (ref 1.010–1.025)
Urobilinogen, UA: 1 E.U./dL
pH, UA: 5.5 (ref 5.0–8.0)

## 2023-05-23 MED ORDER — CEFDINIR 300 MG PO CAPS
300.0000 mg | ORAL_CAPSULE | Freq: Two times a day (BID) | ORAL | 0 refills | Status: DC
Start: 2023-05-23 — End: 2023-05-30

## 2023-05-23 NOTE — Progress Notes (Signed)
    Procedures performed today:    None.  Independent interpretation of notes and tests performed by another provider:   None.  Brief History, Exam, Impression, and Recommendations:    Prostatitis Pleasant 67 year old male, he has noted for the past week or so increasing blood and darkening of the urine, vague discomfort left lower back, left anterior pelvis. He also has noted polyuria multiple times daily which is not usual for him. He saw an outside orthopedist who obtained x-rays and suggested there is no arthritis in his hip. He does notice dark urine whenever he plays pickle ball. His exam is normal, his urinalysis today is positive for leukocytes, nitrites, hemoglobin. We will culture this. Differential is broad including prostatitis, nephrolithiasis, structural abnormalities. We will get labs including CBC with differential, CMP, CK, PSA. I would like a renal ultrasound. Considering the nitrites, lower abdominal and back pain, I do think we should add antibiotics as we await the culture.  Update: Blood sugar elevated, CK elevated.  PSA is highly elevated, absolutely needs to take the antibiotics, I think this is a prostatitis, cancel 2-week follow-up and return in a month, we should recheck PSA to make sure it comes down.  If not starting to feel better after the course of antibiotics let me know and I will send in another couple of weeks.  Prostatitis can take a month of antibiotics, sometimes more to get better.  Hyperglycemia Blood sugar elevated in the 150s, adding an A1c.    ____________________________________________ Ihor Austin. Benjamin Stain, M.D., ABFM., CAQSM., AME. Primary Care and Sports Medicine [facility]  Adjunct Professor of Family Medicine  [employer]  Restaurant manager, fast food

## 2023-05-23 NOTE — Assessment & Plan Note (Signed)
Pleasant 67 year old male, he has noted for the past week or so increasing blood and darkening of the urine, vague discomfort left lower back, left anterior pelvis. He also has noted polyuria multiple times daily which is not usual for him. He saw an outside orthopedist who obtained x-rays and suggested there is no arthritis in his hip. He does notice dark urine whenever he plays pickle ball. His exam is normal, his urinalysis today is positive for leukocytes, nitrites, hemoglobin. We will culture this. Differential is broad including prostatitis, nephrolithiasis, structural abnormalities. We will get labs including CBC with differential, CMP, CK, PSA. I would like a renal ultrasound. Considering the nitrites, lower abdominal and back pain, I do think we should add antibiotics as we await the culture.  Update: Blood sugar elevated, CK elevated.

## 2023-05-23 NOTE — Addendum Note (Signed)
Addended by: Carren Rang A on: 05/23/2023 03:01 PM   Modules accepted: Orders

## 2023-05-24 ENCOUNTER — Ambulatory Visit: Payer: Medicare HMO | Admitting: Sports Medicine

## 2023-05-24 ENCOUNTER — Other Ambulatory Visit: Payer: Medicare HMO

## 2023-05-24 DIAGNOSIS — R739 Hyperglycemia, unspecified: Secondary | ICD-10-CM | POA: Insufficient documentation

## 2023-05-24 LAB — URINE CULTURE
MICRO NUMBER:: 15176710
Result:: NO GROWTH
SPECIMEN QUALITY:: ADEQUATE

## 2023-05-24 NOTE — Assessment & Plan Note (Signed)
Blood sugar elevated in the 150s, adding an A1c.

## 2023-05-24 NOTE — Addendum Note (Signed)
Addended by: Monica Becton on: 05/24/2023 09:04 AM   Modules accepted: Orders

## 2023-05-26 DIAGNOSIS — N419 Inflammatory disease of prostate, unspecified: Secondary | ICD-10-CM

## 2023-05-26 LAB — CBC WITH DIFFERENTIAL/PLATELET
Absolute Monocytes: 515 cells/uL (ref 200–950)
Basophils Absolute: 37 cells/uL (ref 0–200)
Basophils Relative: 0.4 %
Eosinophils Absolute: 129 cells/uL (ref 15–500)
Eosinophils Relative: 1.4 %
HCT: 46.2 % (ref 38.5–50.0)
Hemoglobin: 15.2 g/dL (ref 13.2–17.1)
Lymphs Abs: 2236 cells/uL (ref 850–3900)
MCH: 28.8 pg (ref 27.0–33.0)
MCHC: 32.9 g/dL (ref 32.0–36.0)
MCV: 87.7 fL (ref 80.0–100.0)
MPV: 11 fL (ref 7.5–12.5)
Monocytes Relative: 5.6 %
Neutro Abs: 6284 cells/uL (ref 1500–7800)
Neutrophils Relative %: 68.3 %
Platelets: 274 10*3/uL (ref 140–400)
RBC: 5.27 10*6/uL (ref 4.20–5.80)
RDW: 13 % (ref 11.0–15.0)
Total Lymphocyte: 24.3 %
WBC: 9.2 10*3/uL (ref 3.8–10.8)

## 2023-05-26 LAB — COMPREHENSIVE METABOLIC PANEL
AG Ratio: 1.3 (calc) (ref 1.0–2.5)
ALT: 15 U/L (ref 9–46)
AST: 21 U/L (ref 10–35)
Albumin: 4.1 g/dL (ref 3.6–5.1)
Alkaline phosphatase (APISO): 96 U/L (ref 35–144)
BUN/Creatinine Ratio: 25 (calc) — ABNORMAL HIGH (ref 6–22)
BUN: 27 mg/dL — ABNORMAL HIGH (ref 7–25)
CO2: 28 mmol/L (ref 20–32)
Calcium: 9.2 mg/dL (ref 8.6–10.3)
Chloride: 104 mmol/L (ref 98–110)
Creat: 1.09 mg/dL (ref 0.70–1.35)
Globulin: 3.2 g/dL (calc) (ref 1.9–3.7)
Glucose, Bld: 152 mg/dL — ABNORMAL HIGH (ref 65–99)
Potassium: 4.8 mmol/L (ref 3.5–5.3)
Sodium: 140 mmol/L (ref 135–146)
Total Bilirubin: 0.9 mg/dL (ref 0.2–1.2)
Total Protein: 7.3 g/dL (ref 6.1–8.1)

## 2023-05-26 LAB — HEMOGLOBIN A1C
Hgb A1c MFr Bld: 5.5 % of total Hgb (ref <5.7)
Mean Plasma Glucose: 111 mg/dL
eAG (mmol/L): 6.2 mmol/L

## 2023-05-26 LAB — TEST AUTHORIZATION

## 2023-05-26 LAB — CK: Total CK: 363 U/L — ABNORMAL HIGH (ref 44–196)

## 2023-05-26 LAB — PSA, TOTAL AND FREE
PSA, Free: 1.4 ng/mL
PSA, Total: 14.7 ng/mL — ABNORMAL HIGH (ref <=4.0)

## 2023-05-30 ENCOUNTER — Telehealth: Payer: Self-pay

## 2023-05-30 DIAGNOSIS — R319 Hematuria, unspecified: Secondary | ICD-10-CM

## 2023-05-30 MED ORDER — CEFDINIR 300 MG PO CAPS
300.0000 mg | ORAL_CAPSULE | Freq: Two times a day (BID) | ORAL | 0 refills | Status: DC
Start: 2023-05-30 — End: 2023-07-11

## 2023-05-30 NOTE — Telephone Encounter (Signed)
Patient came into office, Medication is working well, blood clot in urine 4-6 times since last Tuesday, handling medication with no problem, patient is down to one pill on his cefdinir, patient would like to know if he could get a refill, please advise, thanks.

## 2023-05-30 NOTE — Telephone Encounter (Signed)
It is reasonable to do another week.

## 2023-05-31 ENCOUNTER — Other Ambulatory Visit: Payer: Self-pay

## 2023-06-06 ENCOUNTER — Ambulatory Visit: Payer: Medicare HMO | Admitting: Sports Medicine

## 2023-06-11 ENCOUNTER — Other Ambulatory Visit: Payer: Self-pay | Admitting: Cardiology

## 2023-06-14 ENCOUNTER — Ambulatory Visit: Payer: Medicare HMO | Admitting: Urology

## 2023-06-14 ENCOUNTER — Encounter: Payer: Self-pay | Admitting: Urology

## 2023-06-14 VITALS — BP 144/78 | HR 60 | Ht 68.0 in | Wt 205.0 lb

## 2023-06-14 DIAGNOSIS — N419 Inflammatory disease of prostate, unspecified: Secondary | ICD-10-CM | POA: Diagnosis not present

## 2023-06-14 DIAGNOSIS — N401 Enlarged prostate with lower urinary tract symptoms: Secondary | ICD-10-CM

## 2023-06-14 DIAGNOSIS — R310 Gross hematuria: Secondary | ICD-10-CM

## 2023-06-14 NOTE — Progress Notes (Signed)
Assessment: 1. Gross hematuria   2. Benign localized prostatic hyperplasia with lower urinary tract symptoms (LUTS)      Plan: Today I had a long and detailed discussion with the patient regarding his gross hematuria and guidelines/recommendations for urologic evaluation. I have recommended per guidelines upper tract imaging with CT-hematuria protocol and follow-up for cystoscopy. DRE at time of cystoscopy Advised patient to restart tamsulosin  Will also need to follow-up elevated PSA which was drawn at a time of acute flareup of lower urinary tract symptoms and gross hematuria.   Chief Complaint: blood in urine  History of Present Illness:  Dominic Ray is a 67 y.o. male who is seen in consultation from Monica Becton, MD for evaluation of gross hematuria. Patient recently presented PCP complaining of increased lower urinary tract symptoms associated with gross hematuria.  He has long standing significant LUTS.  Was started on tamsulosin 11/2022 but has not been taking recently. The patient also had some worsening pain in his hip. He plays pickle ball regularly and also has noticed on multiple occasions dark tea colored and Coca-Cola colored urine when he finished playing pickle ball.  Urinalysis obtained at that time also demonstrated nitrate positive and positive leukocytes.  Urine culture was negative. UA today shows pyuria and >30rbc/hpf  Patient underwent additional testing including renal ultrasound which showed no evidence of hydro stones or renal mass.  Note was made of a very large prostate with an estimated volume of 72 mL protruding into the bladder.  PSA was markedly elevated over baseline at 14.2 with baseline around 2.0.   Past Medical History:  Past Medical History:  Diagnosis Date   Asthma    AS A CHILD   Basal cell carcinoma of nasal tip 08/24/2012   Coronary artery disease    a. Abnl stress test -> s/p CABGx4 in 04/2017 with LIMA-LAD, SVG-diag, SVG-AM,  SVG-PDA.   Hyperlipidemia    Hypertension    Male hypogonadism 08/24/2012   Onychomycosis 08/24/2012    Past Surgical History:  Past Surgical History:  Procedure Laterality Date   CARDIAC CATHETERIZATION  05/08/2017   CARDIOVASCULAR STRESS TEST  04/2017   CORONARY ARTERY BYPASS GRAFT N/A 05/09/2017   Procedure: CORONARY ARTERY BYPASS GRAFTING times four using left internal mammary artery and right leg saphenous vein  ;  Surgeon: Alleen Borne, MD;  Location: [facility] OR;  Service: Open Heart Surgery;  Laterality: N/A;   LEFT HEART CATH AND CORONARY ANGIOGRAPHY N/A 05/08/2017   Procedure: Left Heart Cath and Coronary Angiography;  Surgeon: Lennette Bihari, MD;  Location: [facility] INVASIVE CV LAB;  Service: Cardiovascular;  Laterality: N/A;   TEE WITHOUT CARDIOVERSION N/A 05/09/2017   Procedure: TRANSESOPHAGEAL ECHOCARDIOGRAM (TEE);  Surgeon: Alleen Borne, MD;  [facility]: [facility] OR;  Service: Open Heart Surgery;  Laterality: N/A;   TONSILLECTOMY  1968    Allergies:  Allergies  Allergen Reactions   Anesthetics, Amide    [name] [Lidocaine] Rash    Anything ending in (-caine) per patient     Family History:  Family History  Problem Relation Age of Onset   Cancer Mother    CAD Father        Died of MI at age 10   Breast cancer Maternal Grandfather    Colon cancer Neg Hx    Stomach cancer Neg Hx    Colon polyps Neg Hx    Crohn's disease Neg Hx    Esophageal cancer Neg Hx    Rectal  cancer Neg Hx    Ulcerative colitis Neg Hx     Social History:  Social History   Tobacco Use   Smoking status: Never   Smokeless tobacco: Never  Vaping Use   Vaping status: Never Used  Substance Use Topics   Alcohol use: Yes    Comment: Occasional   Drug use: No    Review of symptoms:  Constitutional:  Negative for unexplained weight loss, night sweats, fever, chills ENT:  Negative for nose bleeds, sinus pain, painful swallowing CV:  Negative for chest pain, shortness of breath, exercise  intolerance, palpitations, loss of consciousness Resp:  Negative for cough, wheezing, shortness of breath GI:  Negative for nausea, vomiting, diarrhea, bloody stools GU:  Positives noted in HPI; otherwise negative for gross hematuria, dysuria, urinary incontinence Neuro:  Negative for seizures, poor balance, limb weakness, slurred speech Psych:  Negative for lack of energy, depression, anxiety Endocrine:  Negative for polydipsia, polyuria, symptoms of hypoglycemia (dizziness, hunger, sweating) Hematologic:  Negative for anemia, purpura, petechia, prolonged or excessive bleeding, use of anticoagulants  Allergic:  Negative for difficulty breathing or choking as a result of exposure to anything; no shellfish allergy; no allergic response (rash/itch) to materials, foods  Physical exam: BP (!) 144/78   Pulse 60   Ht 5\' 8"  (1.727 m)   Wt 205 lb (93 kg)   BMI 31.17 kg/m  GENERAL APPEARANCE:  Well appearing, well developed, well nourished, NAD   Results: UA is remarkable for significant pyuria and microscopic hematuria

## 2023-06-16 ENCOUNTER — Other Ambulatory Visit (HOSPITAL_BASED_OUTPATIENT_CLINIC_OR_DEPARTMENT_OTHER): Payer: Self-pay

## 2023-06-16 ENCOUNTER — Telehealth: Payer: Self-pay

## 2023-06-16 DIAGNOSIS — N139 Obstructive and reflux uropathy, unspecified: Secondary | ICD-10-CM

## 2023-06-16 MED ORDER — TAMSULOSIN HCL 0.4 MG PO CAPS
0.4000 mg | ORAL_CAPSULE | Freq: Every day | ORAL | 3 refills | Status: DC
Start: 2023-06-16 — End: 2024-04-01

## 2023-06-16 NOTE — Telephone Encounter (Signed)
Patient came into office to see if he could get medication refill on the following medications tamsulosin (FLOMAX) 0.4 MG CAPS capsule, please advise, thanks.

## 2023-06-16 NOTE — Telephone Encounter (Signed)
Refilled

## 2023-06-20 ENCOUNTER — Encounter (HOSPITAL_BASED_OUTPATIENT_CLINIC_OR_DEPARTMENT_OTHER): Payer: Self-pay

## 2023-06-20 ENCOUNTER — Ambulatory Visit (HOSPITAL_BASED_OUTPATIENT_CLINIC_OR_DEPARTMENT_OTHER)
Admission: RE | Admit: 2023-06-20 | Discharge: 2023-06-20 | Disposition: A | Discharge status: Home / Self Care | Payer: Medicare HMO | Source: Ambulatory Visit | Attending: Urology | Admitting: Urology

## 2023-06-20 DIAGNOSIS — K8020 Calculus of gallbladder without cholecystitis without obstruction: Secondary | ICD-10-CM | POA: Diagnosis not present

## 2023-06-20 DIAGNOSIS — R310 Gross hematuria: Secondary | ICD-10-CM | POA: Diagnosis not present

## 2023-06-20 DIAGNOSIS — N400 Enlarged prostate without lower urinary tract symptoms: Secondary | ICD-10-CM | POA: Diagnosis not present

## 2023-06-20 DIAGNOSIS — N3289 Other specified disorders of bladder: Secondary | ICD-10-CM | POA: Diagnosis not present

## 2023-06-20 MED ORDER — IOHEXOL 300 MG/ML  SOLN
100.0000 mL | Freq: Once | INTRAMUSCULAR | Status: AC | PRN
  Administered 2023-06-20: 125 mL via INTRAVENOUS

## 2023-06-24 ENCOUNTER — Other Ambulatory Visit: Payer: Self-pay | Admitting: Cardiology

## 2023-06-27 ENCOUNTER — Ambulatory Visit (INDEPENDENT_AMBULATORY_CARE_PROVIDER_SITE_OTHER): Payer: Medicare HMO | Admitting: Sports Medicine

## 2023-06-27 ENCOUNTER — Encounter: Payer: Self-pay | Admitting: Sports Medicine

## 2023-06-27 VITALS — BP 132/65 | HR 61

## 2023-06-27 DIAGNOSIS — N3289 Other specified disorders of bladder: Secondary | ICD-10-CM | POA: Insufficient documentation

## 2023-06-27 DIAGNOSIS — N419 Inflammatory disease of prostate, unspecified: Secondary | ICD-10-CM | POA: Diagnosis not present

## 2023-06-27 NOTE — Assessment & Plan Note (Signed)
This pleasant 67 year old male returns, he was seen for hematuria recently, PSA was highly elevated, he feels a lot better with antibiotics. We did get a reconsult with urology, ultimately hematuria workup CT was obtained that showed a bladder mass concerning for carcinoma, he does have a cystoscopy planned. We are going to recheck his PSA today. Overall he feels good.

## 2023-06-27 NOTE — Progress Notes (Signed)
    Procedures performed today:    None.  Independent interpretation of notes and tests performed by another provider:   None.  Brief History, Exam, Impression, and Recommendations:    Prostatitis This pleasant 67 year old male returns, he was seen for hematuria recently, PSA was highly elevated, he feels a lot better with antibiotics. We did get a reconsult with urology, ultimately hematuria workup CT was obtained that showed a bladder mass concerning for carcinoma, he does have a cystoscopy planned. We are going to recheck his PSA today. Overall he feels good.  Bladder mass Noted on CT, patient did have some hematuria. Further management per urology, we will watch from afar.    ____________________________________________ Ihor Austin. Benjamin Stain, M.D., ABFM., CAQSM., AME. Primary Care and Sports Medicine [facility]  Adjunct Professor of Family Medicine  [employer]  Restaurant manager, fast food

## 2023-06-27 NOTE — Assessment & Plan Note (Signed)
Noted on CT, patient did have some hematuria. Further management per urology, we will watch from afar.

## 2023-06-29 LAB — PSA, TOTAL AND FREE
PSA, Free Pct: 12.9 %
PSA, Free: 0.4 ng/mL
Prostate Specific Ag, Serum: 3.1 ng/mL (ref 0.0–4.0)

## 2023-07-02 NOTE — Progress Notes (Unsigned)
HPI: FU CAD. Echocardiogram May 2018 showed normal LV systolic function and mild left atrial enlargement. Cardiac catheterization June 2018 showed a 70% D1, 90 mid LAD, 90% proximal right coronary artery followed by a 100% lesion and occluded circumflex. Ejection fraction 50-55%. Carotid Dopplers June 2018 with no significant stenosis. Patient subsequently underwent coronary artery bypassing graft with a LIMA  The LAD, saphenous pain graft to the diagonal, saphenous vein graft to the acute marginal and saphenous vein graft to the PDA.  Recent CT August 2024 showed mass in the right lateral and posterior bladder suspicious for carcinoma.  Since last seen, the patient denies any dyspnea on exertion, orthopnea, PND, pedal edema, palpitations, syncope or chest pain.   Current Outpatient Medications  Medication Sig Dispense Refill   amLODipine (NORVASC) 5 MG tablet TAKE 1 TABLET (5 MG TOTAL) BY MOUTH DAILY. 90 tablet 1   aspirin EC 81 MG tablet Take 1 tablet (81 mg total) by mouth daily. 90 tablet 3   atorvastatin (LIPITOR) 80 MG tablet Take 1 tablet (80 mg total) by mouth daily. 30 tablet 0   cefdinir (OMNICEF) 300 MG capsule Take 1 capsule (300 mg total) by mouth 2 (two) times daily. 14 capsule 0   metoprolol succinate (TOPROL-XL) 25 MG 24 hr tablet Take 0.5 tablets (12.5 mg total) by mouth daily. Please keep upcoming appt for further refills. Thank you 15 tablet 0   tamsulosin (FLOMAX) 0.4 MG CAPS capsule Take 1 capsule (0.4 mg total) by mouth daily. 90 capsule 3   No current facility-administered medications for this visit.     Past Medical History:  Diagnosis Date   Asthma    AS A CHILD   Basal cell carcinoma of nasal tip 08/24/2012   Coronary artery disease    a. Abnl stress test -> s/p CABGx4 in 04/2017 with LIMA-LAD, SVG-diag, SVG-AM, SVG-PDA.   Hyperlipidemia    Hypertension    Male hypogonadism 08/24/2012   Onychomycosis 08/24/2012    Past Surgical History:  Procedure  Laterality Date   CARDIAC CATHETERIZATION  05/08/2017   CARDIOVASCULAR STRESS TEST  04/2017   CORONARY ARTERY BYPASS GRAFT N/A 05/09/2017   Procedure: CORONARY ARTERY BYPASS GRAFTING times four using left internal mammary artery and right leg saphenous vein  ;  Surgeon: Alleen Borne, MD;  Location: [facility] OR;  Service: Open Heart Surgery;  Laterality: N/A;   LEFT HEART CATH AND CORONARY ANGIOGRAPHY N/A 05/08/2017   Procedure: Left Heart Cath and Coronary Angiography;  Surgeon: Lennette Bihari, MD;  Location: [facility] INVASIVE CV LAB;  Service: Cardiovascular;  Laterality: N/A;   TEE WITHOUT CARDIOVERSION N/A 05/09/2017   Procedure: TRANSESOPHAGEAL ECHOCARDIOGRAM (TEE);  Surgeon: Alleen Borne, MD;  Location: [facility] OR;  Service: Open Heart Surgery;  Laterality: N/A;   TONSILLECTOMY  1968    Social History   Socioeconomic History   Marital status: Married    Spouse name: Inocencio Homes   Number of children: 0   Years of education: 16   Highest education level: Bachelor's degree (e.g., BA, AB, BS)  Occupational History    Comment: Unemployed   Occupation: Retired  Tobacco Use   Smoking status: Never   Smokeless tobacco: Never  Vaping Use   Vaping status: Never Used  Substance and Sexual Activity   Alcohol use: Yes    Comment: Occasional   Drug use: No   Sexual activity: Not on file  Other Topics Concern   Not on file  Social History  Narrative   Lives with his wife. He enjoys playing pickle ball.   Social Determinants of Health   Financial Resource Strain: Low Risk  (10/03/2022)   Overall Financial Resource Strain (CARDIA)    Difficulty of Paying Living Expenses: Not hard at all  Food Insecurity: No Food Insecurity (10/03/2022)   Hunger Vital Sign    Worried About Running Out of Food in the Last Year: Never true    Ran Out of Food in the Last Year: Never true  Transportation Needs: No Transportation Needs (10/03/2022)   PRAPARE - Administrator, Civil Service (Medical): No     Lack of Transportation (Non-Medical): No  Physical Activity: Sufficiently Active (10/03/2022)   Exercise Vital Sign    Days of Exercise per Week: 4 days    Minutes of Exercise per Session: 60 min  Stress: No Stress Concern Present (10/03/2022)   [employer] of Occupational Health - Occupational Stress Questionnaire    Feeling of Stress : Not at all  Social Connections: Socially Integrated (10/03/2022)   Social Connection and Isolation Panel [NHANES]    Frequency of Communication with Friends and Family: Twice a week    Frequency of Social Gatherings with Friends and Family: Twice a week    Attends Religious Services: More than 4 times per year    Active Member of [employer] or Organizations: Yes    Attends Engineer, structural: More than 4 times per year    Marital Status: Married  Catering manager Violence: Not At Risk (10/03/2022)   Humiliation, Afraid, Rape, and Kick questionnaire    Fear of Current or Ex-Partner: No    Emotionally Abused: No    Physically Abused: No    Sexually Abused: No    Family History  Problem Relation Age of Onset   Cancer Mother    CAD Father        Died of MI at age 71   Breast cancer Maternal Grandfather    Colon cancer Neg Hx    Stomach cancer Neg Hx    Colon polyps Neg Hx    Crohn's disease Neg Hx    Esophageal cancer Neg Hx    Rectal cancer Neg Hx    Ulcerative colitis Neg Hx     ROS: no fevers or chills, productive cough, hemoptysis, dysphasia, odynophagia, melena, hematochezia, dysuria, hematuria, rash, seizure activity, orthopnea, PND, pedal edema, claudication. Remaining systems are negative.  Physical Exam: Well-developed well-nourished in no acute distress.  Skin is warm and dry.  HEENT is normal.  Neck is supple.  Chest is clear to auscultation with normal expansion.  Cardiovascular exam is regular rate and rhythm.  2/6 systolic murmur left sternal border. Abdominal exam nontender or distended. No masses  palpated. Extremities show no edema. neuro grossly intact  EKG Interpretation Date/Time:  Wednesday July 05 2023 11:34:02 EDT Ventricular Rate:  52 PR Interval:  180 QRS Duration:  92 QT Interval:  396 QTC Calculation: 368 R Axis:   22  Text Interpretation: Sinus bradycardia When compared with ECG of 10-May-2017 07:03, Vent. rate has decreased BY  34 BPM Nonspecific T wave abnormality no longer evident in Inferior leads Nonspecific T wave abnormality no longer evident in Lateral leads QT has shortened Confirmed by Olga Millers ([id number]) on 07/05/2023 11:49:55 AM    A/P  1 coronary artery disease status post coronary artery bypass and graft-patient denies chest pain.  Continue medical therapy with aspirin and statin.  2 hypertension-patient's blood pressure  is controlled.  Continue present medications.  3 hyperlipidemia-continue statin.  Check lipids.  Recent liver function is normal.  4 bladder mass-patient is scheduled for cystoscopy in 1 to 2 weeks.  5 murmur-question mild aortic stenosis.  Will plan follow-up echocardiogram.  Olga Millers, MD

## 2023-07-05 ENCOUNTER — Ambulatory Visit: Payer: Medicare HMO | Attending: Cardiology | Admitting: Cardiology

## 2023-07-05 ENCOUNTER — Encounter: Payer: Self-pay | Admitting: Cardiology

## 2023-07-05 VITALS — BP 131/74 | HR 52 | Ht 68.0 in | Wt 205.8 lb

## 2023-07-05 DIAGNOSIS — R011 Cardiac murmur, unspecified: Secondary | ICD-10-CM

## 2023-07-05 DIAGNOSIS — I2510 Atherosclerotic heart disease of native coronary artery without angina pectoris: Secondary | ICD-10-CM | POA: Diagnosis not present

## 2023-07-05 DIAGNOSIS — E7800 Pure hypercholesterolemia, unspecified: Secondary | ICD-10-CM

## 2023-07-05 NOTE — Patient Instructions (Addendum)
Your physician has requested that you have an echocardiogram. Echocardiography is a painless test that uses sound waves to create images of your heart. It provides your doctor with information about the size and shape of your heart and how well your heart's chambers and valves are working. This procedure takes approximately one hour. There are no restrictions for this procedure. Please do NOT wear cologne, perfume, aftershave, or lotions (deodorant is allowed). Please arrive 15 minutes prior to your appointment time. [place] [facility]-1ST FLOOR IMAGING DEPARTMENT   Lab Work:  Your physician recommends that you return for lab work FASTING  [employer] on the 3 rd floor in [address] Hours-Monday - Friday 8 am-11:30 AM and 1 pm -4 pm    If you have labs (blood work) drawn today and your tests are completely normal, you will receive your results only by: MyChart Message (if you have MyChart) OR A paper copy in the mail If you have any lab test that is abnormal or we need to change your treatment, we will call you to review the results.   Follow-Up: At [facility], you and your health needs are our priority.  As part of our continuing mission to provide you with exceptional heart care, we have created designated Provider Care Teams.  These Care Teams include your primary Cardiologist (physician) and Advanced Practice Providers (APPs -  Physician Assistants and Nurse Practitioners) who all work together to provide you with the care you need, when you need it.  We recommend signing up for the patient portal called "MyChart".  Sign up information is provided on this After Visit Summary.  MyChart is used to connect with patients for Virtual Visits (Telemedicine).  Patients are able to view lab/test results, encounter notes, upcoming appointments, etc.  Non-urgent messages can be sent to your provider as well.   To learn more about what you can do with MyChart, go to  [email or web address].    Your next appointment:   12 month(s)  Provider:   Olga Millers, MD

## 2023-07-07 ENCOUNTER — Other Ambulatory Visit: Payer: Self-pay | Admitting: Cardiology

## 2023-07-11 ENCOUNTER — Ambulatory Visit: Payer: Self-pay | Admitting: Urology

## 2023-07-11 ENCOUNTER — Other Ambulatory Visit: Payer: Self-pay | Admitting: Sports Medicine

## 2023-07-11 ENCOUNTER — Ambulatory Visit: Payer: Medicare HMO | Admitting: Urology

## 2023-07-11 VITALS — BP 127/70 | HR 54 | Ht 68.0 in | Wt 205.0 lb

## 2023-07-11 DIAGNOSIS — I10 Essential (primary) hypertension: Secondary | ICD-10-CM

## 2023-07-11 DIAGNOSIS — D494 Neoplasm of unspecified behavior of bladder: Secondary | ICD-10-CM

## 2023-07-11 DIAGNOSIS — R310 Gross hematuria: Secondary | ICD-10-CM | POA: Diagnosis not present

## 2023-07-11 DIAGNOSIS — N3289 Other specified disorders of bladder: Secondary | ICD-10-CM | POA: Diagnosis not present

## 2023-07-11 DIAGNOSIS — R8271 Bacteriuria: Secondary | ICD-10-CM

## 2023-07-11 DIAGNOSIS — R8281 Pyuria: Secondary | ICD-10-CM

## 2023-07-11 DIAGNOSIS — N401 Enlarged prostate with lower urinary tract symptoms: Secondary | ICD-10-CM | POA: Diagnosis not present

## 2023-07-11 MED ORDER — CEFDINIR 300 MG PO CAPS: 300.0000 mg | ORAL_CAPSULE | Freq: Two times a day (BID) | ORAL | 0 refills | Status: DC

## 2023-07-11 MED ORDER — CEFTRIAXONE SODIUM 1 G IJ SOLR
1.0000 g | Freq: Once | INTRAMUSCULAR | Status: AC
  Administered 2023-07-11: 1 g via INTRAMUSCULAR

## 2023-07-11 NOTE — Progress Notes (Signed)
IM Injection  Patient is present today for an IM Injection for treatment of UTI Drug: Ceftriaxone Dose:1 gram Location:left glute Lot: [id number] Exp:01/2025 Patient tolerated well, no complications were noted  Performed by: Caterine Mcmeans N., CMA(AAMA)

## 2023-07-11 NOTE — H&P (View-Only) (Signed)
 Assessment: 1. Gross hematuria   2. Benign localized prostatic hyperplasia with lower urinary tract symptoms (LUTS)   3. Bladder mass     Plan: Today had a long and detailed discussion with the patient and his family regarding the CT finding suggestive of bladder cancer.   (Total time = ) I discussed with him bladder cancer in general including the differences between low and high-grade disease as well as the importance of depth of invasion of high-grade disease.  Patient and family had numerous questions today which I answered.  I have recommended to proceed with further evaluation with cystoscopy under anesthesia with transurethral resection of bladder tumor.  Nature procedure reviewed in detail today including potential adverse events and complications.  Patient agrees to proceed.  Urine for culture today.  Will treat empirically with Omnicef 300 twice daily for 7 days  Chief Complaint: Blood in urine  HPI: Dominic Ray is a 67 y.o. male who presents for continued evaluation of lower urinary tract symptoms and gross hematuria. Patient is accompanied today by his wife and sister. CT hematuria protocol reviewed with patient and family today showing findings worrisome for significant bladder cancer.  He has asymmetric nodular masslike thickening along the right lateral and posterior bladder wall highly suspicious.  There is no evidence of metastatic disease.  Upper tracts otherwise normal.  Urinalysis today is concerning for possible UTI with significant pyuria hematuria and bacteriuria  Portions of the above documentation were copied from a prior visit for review purposes only.  Allergies: Allergies  Allergen Reactions   Anesthetics, Amide    [name] [Lidocaine] Rash    Anything ending in (-caine) per patient     PMH: Past Medical History:  Diagnosis Date   Asthma    AS A CHILD   Basal cell carcinoma of nasal tip 08/24/2012   Coronary artery disease    a. Abnl stress  test -> s/p CABGx4 in 04/2017 with LIMA-LAD, SVG-diag, SVG-AM, SVG-PDA.   Hyperlipidemia    Hypertension    Male hypogonadism 08/24/2012   Onychomycosis 08/24/2012    PSH: Past Surgical History:  Procedure Laterality Date   CARDIAC CATHETERIZATION  05/08/2017   CARDIOVASCULAR STRESS TEST  04/2017   CORONARY ARTERY BYPASS GRAFT N/A 05/09/2017   Procedure: CORONARY ARTERY BYPASS GRAFTING times four using left internal mammary artery and right leg saphenous vein  ;  Surgeon: Alleen Borne, MD;  Location: [facility] OR;  Service: Open Heart Surgery;  Laterality: N/A;   LEFT HEART CATH AND CORONARY ANGIOGRAPHY N/A 05/08/2017   Procedure: Left Heart Cath and Coronary Angiography;  Surgeon: Lennette Bihari, MD;  Location: [facility] INVASIVE CV LAB;  Service: Cardiovascular;  Laterality: N/A;   TEE WITHOUT CARDIOVERSION N/A 05/09/2017   Procedure: TRANSESOPHAGEAL ECHOCARDIOGRAM (TEE);  Surgeon: Alleen Borne, MD;  Location: [facility] OR;  Service: Open Heart Surgery;  Laterality: N/A;   TONSILLECTOMY  1968    SH: Social History   Tobacco Use   Smoking status: Never   Smokeless tobacco: Never  Vaping Use   Vaping status: Never Used  Substance Use Topics   Alcohol use: Yes    Comment: Occasional   Drug use: No    ROS: Constitutional:  Negative for fever, chills, weight loss CV: Negative for chest pain, previous MI, hypertension Respiratory:  Negative for shortness of breath, wheezing, sleep apnea, frequent cough GI:  Negative for nausea, vomiting, bloody stool, GERD  PE: BP 127/70   Pulse (!) 54   Ht  5\' 8"  (1.727 m)   Wt 205 lb (93 kg)   BMI 31.17 kg/m  GENERAL APPEARANCE:  Well appearing, well developed, well nourished, NAD HEENT:  Atraumatic, normocephalic COR:  RR LUNGS:  CTA ABDOMEN:  Soft, non-tender, no masses EXTREMITIES:  Moves all extremities well, without clubbing, cyanosis, or edema NEUROLOGIC:  Alert and oriented x 3, normal gait, MENTAL STATUS:  appropriate BACK:  Non-tender to  palpation, No CVAT SKIN:  Warm, dry, and intact   Results: UA is concerning for infection with significant hematuria, pyuria, and bacteriuria

## 2023-07-11 NOTE — Progress Notes (Signed)
Assessment: 1. Gross hematuria   2. Benign localized prostatic hyperplasia with lower urinary tract symptoms (LUTS)   3. Bladder mass     Plan: Today had a long and detailed discussion with the patient and his family regarding the CT finding suggestive of bladder cancer.   (Total time = ) I discussed with him bladder cancer in general including the differences between low and high-grade disease as well as the importance of depth of invasion of high-grade disease.  Patient and family had numerous questions today which I answered.  I have recommended to proceed with further evaluation with cystoscopy under anesthesia with transurethral resection of bladder tumor.  Nature procedure reviewed in detail today including potential adverse events and complications.  Patient agrees to proceed.  Urine for culture today.  Will treat empirically with Omnicef 300 twice daily for 7 days  Chief Complaint: Blood in urine  HPI: Dominic Ray is a 67 y.o. male who presents for continued evaluation of lower urinary tract symptoms and gross hematuria. Patient is accompanied today by his wife and sister. CT hematuria protocol reviewed with patient and family today showing findings worrisome for significant bladder cancer.  He has asymmetric nodular masslike thickening along the right lateral and posterior bladder wall highly suspicious.  There is no evidence of metastatic disease.  Upper tracts otherwise normal.  Urinalysis today is concerning for possible UTI with significant pyuria hematuria and bacteriuria  Portions of the above documentation were copied from a prior visit for review purposes only.  Allergies: Allergies  Allergen Reactions   Anesthetics, Amide    [name] [Lidocaine] Rash    Anything ending in (-caine) per patient     PMH: Past Medical History:  Diagnosis Date   Asthma    AS A CHILD   Basal cell carcinoma of nasal tip 08/24/2012   Coronary artery disease    a. Abnl stress  test -> s/p CABGx4 in 04/2017 with LIMA-LAD, SVG-diag, SVG-AM, SVG-PDA.   Hyperlipidemia    Hypertension    Male hypogonadism 08/24/2012   Onychomycosis 08/24/2012    PSH: Past Surgical History:  Procedure Laterality Date   CARDIAC CATHETERIZATION  05/08/2017   CARDIOVASCULAR STRESS TEST  04/2017   CORONARY ARTERY BYPASS GRAFT N/A 05/09/2017   Procedure: CORONARY ARTERY BYPASS GRAFTING times four using left internal mammary artery and right leg saphenous vein  ;  Surgeon: Alleen Borne, MD;  Location: [facility] OR;  Service: Open Heart Surgery;  Laterality: N/A;   LEFT HEART CATH AND CORONARY ANGIOGRAPHY N/A 05/08/2017   Procedure: Left Heart Cath and Coronary Angiography;  Surgeon: Lennette Bihari, MD;  Location: [facility] INVASIVE CV LAB;  Service: Cardiovascular;  Laterality: N/A;   TEE WITHOUT CARDIOVERSION N/A 05/09/2017   Procedure: TRANSESOPHAGEAL ECHOCARDIOGRAM (TEE);  Surgeon: Alleen Borne, MD;  Location: [facility] OR;  Service: Open Heart Surgery;  Laterality: N/A;   TONSILLECTOMY  1968    SH: Social History   Tobacco Use   Smoking status: Never   Smokeless tobacco: Never  Vaping Use   Vaping status: Never Used  Substance Use Topics   Alcohol use: Yes    Comment: Occasional   Drug use: No    ROS: Constitutional:  Negative for fever, chills, weight loss CV: Negative for chest pain, previous MI, hypertension Respiratory:  Negative for shortness of breath, wheezing, sleep apnea, frequent cough GI:  Negative for nausea, vomiting, bloody stool, GERD  PE: BP 127/70   Pulse (!) 54   Ht  5\' 8"  (1.727 m)   Wt 205 lb (93 kg)   BMI 31.17 kg/m  GENERAL APPEARANCE:  Well appearing, well developed, well nourished, NAD HEENT:  Atraumatic, normocephalic COR:  RR LUNGS:  CTA ABDOMEN:  Soft, non-tender, no masses EXTREMITIES:  Moves all extremities well, without clubbing, cyanosis, or edema NEUROLOGIC:  Alert and oriented x 3, normal gait, MENTAL STATUS:  appropriate BACK:  Non-tender to  palpation, No CVAT SKIN:  Warm, dry, and intact   Results: UA is concerning for infection with significant hematuria, pyuria, and bacteriuria

## 2023-07-13 ENCOUNTER — Telehealth: Payer: Self-pay

## 2023-07-13 DIAGNOSIS — E7800 Pure hypercholesterolemia, unspecified: Secondary | ICD-10-CM | POA: Diagnosis not present

## 2023-07-13 DIAGNOSIS — I2510 Atherosclerotic heart disease of native coronary artery without angina pectoris: Secondary | ICD-10-CM | POA: Diagnosis not present

## 2023-07-13 LAB — URINE CULTURE: Organism ID, Bacteria: NO GROWTH

## 2023-07-13 NOTE — Patient Instructions (Addendum)
SURGICAL WAITING ROOM VISITATION  Patients having surgery or a procedure may have no more than 2 support people in the waiting area - these visitors may rotate.    Children under the age of 30 must have an adult with them who is not the patient.  Due to an increase in RSV and influenza rates and associated hospitalizations, children ages 90 and under may not visit patients in [facility] hospitals.  If the patient needs to stay at the hospital during part of their recovery, the visitor guidelines for inpatient rooms apply. Pre-op nurse will coordinate an appropriate time for 1 support person to accompany patient in pre-op.  This support person may not rotate.    Please refer to the [facility] website for the visitor guidelines for Inpatients (after your surgery is over and you are in a regular room).    Your procedure is scheduled on: 07/18/23   Report to [facility] Main Entrance    Report to admitting at 10:45 AM   Call this number if you have problems the morning of surgery [phone]   Do not eat food or drink liquids:After Midnight.          If you have questions, please contact your surgeon's office.   FOLLOW BOWEL PREP AND ANY ADDITIONAL PRE OP INSTRUCTIONS YOU RECEIVED FROM YOUR SURGEON'S OFFICE!!!     Oral Hygiene is also important to reduce your risk of infection.                                    Remember - BRUSH YOUR TEETH THE MORNING OF SURGERY WITH YOUR REGULAR TOOTHPASTE  DENTURES WILL BE REMOVED PRIOR TO SURGERY PLEASE DO NOT APPLY "Poly grip" OR ADHESIVES!!!   Stop all vitamins and herbal supplements 7 days before surgery.   Take these medicines the morning of surgery with A SIP OF WATER: Amlodipine, Atorvastatin, Metoprolol, Tamsulosin             You may not have any metal on your body including jewelry, and body piercing             Do not wear lotions, powders, cologne, or deodorant              Men may shave face and neck.   Do not bring  valuables to the hospital. [facility] IS NOT             RESPONSIBLE   FOR VALUABLES.   Contacts, glasses, dentures or bridgework may not be worn into surgery.  DO NOT BRING YOUR HOME MEDICATIONS TO THE HOSPITAL. PHARMACY WILL DISPENSE MEDICATIONS LISTED ON YOUR MEDICATION LIST TO YOU DURING YOUR ADMISSION IN THE HOSPITAL!    Patients discharged on the day of surgery will not be allowed to drive home.  Someone NEEDS to stay with you for the first 24 hours after anesthesia.              Please read over the following fact sheets you were given: IF YOU HAVE QUESTIONS ABOUT YOUR PRE-OP INSTRUCTIONS PLEASE CALL [phone] Ray   If you received a COVID test during your pre-op visit  it is requested that you wear a mask when out in public, stay away from anyone that may not be feeling well and notify your surgeon if you develop symptoms. If you test positive for Covid or have been in contact with anyone that  has tested positive in the last 10 days please notify you surgeon.    [facility] - Preparing for Surgery Before surgery, you can play an important role.  Because skin is not sterile, your skin needs to be as free of germs as possible.  You can reduce the number of germs on your skin by washing with CHG (chlorahexidine gluconate) soap before surgery.  CHG is an antiseptic cleaner which kills germs and bonds with the skin to continue killing germs even after washing. Please DO NOT use if you have an allergy to CHG or antibacterial soaps.  If your skin becomes reddened/irritated stop using the CHG and inform your nurse when you arrive at Short Stay. Do not shave (including legs and underarms) for at least 48 hours prior to the first CHG shower.  You may shave your face/neck.  Please follow these instructions carefully:  1.  Shower with CHG Soap the night before surgery and the  morning of surgery.  2.  If you choose to wash your hair, wash your hair first as usual with your normal   shampoo.  3.  After you shampoo, rinse your hair and body thoroughly to remove the shampoo.                             4.  Use CHG as you would any other liquid soap.  You can apply chg directly to the skin and wash.  Gently with a scrungie or clean washcloth.  5.  Apply the CHG Soap to your body ONLY FROM THE NECK DOWN.   Do   not use on face/ open                           Wound or open sores. Avoid contact with eyes, ears mouth and   genitals (private parts).                       Wash face,  Genitals (private parts) with your normal soap.             6.  Wash thoroughly, paying special attention to the area where your    surgery  will be performed.  7.  Thoroughly rinse your body with warm water from the neck down.  8.  DO NOT shower/wash with your normal soap after using and rinsing off the CHG Soap.                9.  Pat yourself dry with a clean towel.            10.  Wear clean pajamas.            11.  Place clean sheets on your bed the night of your first shower and do not  sleep with pets. Day of Surgery : Do not apply any lotions/deodorants the morning of surgery.  Please wear clean clothes to the hospital/surgery center.  FAILURE TO FOLLOW THESE INSTRUCTIONS MAY RESULT IN THE CANCELLATION OF YOUR SURGERY  PATIENT SIGNATURE_________________________________  NURSE SIGNATURE__________________________________  ________________________________________________________________________

## 2023-07-13 NOTE — Progress Notes (Addendum)
COVID Vaccine Completed: yes  Date of COVID positive in last 90 days:  PCP - Rodney Langton, MD Cardiologist - Olga Millers, MD  Chest x-ray - n/a EKG - 07/05/23 Epic brady 52 Stress Test - 04/25/17 Epic ECHO - 04/12/17 Epic Cardiac Cath - 05/08/17 Epic Pacemaker/ICD device last checked: n/a Spinal Cord Stimulator: n/a  Bowel Prep - no  Sleep Study - n/a CPAP -   Fasting Blood Sugar - n/a Checks Blood Sugar _____ times a day  Last dose of GLP1 agonist-  N/A GLP1 instructions:  N/A   Last dose of SGLT-2 inhibitors-  N/A SGLT-2 instructions: N/A   Blood Thinner Instructions:   Aspirin Instructions: ASA 81, hold 7  days Last Dose:  Activity level: Can go up a flight of stairs and perform activities of daily living without stopping and without symptoms of chest pain or shortness of breath.   Anesthesia review: CAD, CABG x4,asthma, HTN  Patient denies shortness of breath, fever, cough and chest pain at PAT appointment  Patient verbalized understanding of instructions that were given to them at the PAT appointment. Patient was also instructed that they will need to review over the PAT instructions again at home before surgery.

## 2023-07-13 NOTE — Telephone Encounter (Signed)
-----   Message from [name] sent at 07/13/2023  8:46 AM EDT ----- Regarding: urine culture Let him know his urine culture was negative and tell him to stop the antibiotics

## 2023-07-13 NOTE — Telephone Encounter (Signed)
Pt aware of results and to stop taking antibiotics. Pt verbalized understanding.

## 2023-07-14 ENCOUNTER — Encounter (HOSPITAL_COMMUNITY)
Admission: RE | Admit: 2023-07-14 | Discharge: 2023-07-14 | Disposition: A | Discharge status: Home / Self Care | Payer: Medicare HMO | Source: Ambulatory Visit | Attending: Urology | Admitting: Urology

## 2023-07-14 ENCOUNTER — Other Ambulatory Visit: Payer: Self-pay

## 2023-07-14 ENCOUNTER — Encounter (HOSPITAL_COMMUNITY): Payer: Self-pay

## 2023-07-14 VITALS — BP 132/74 | HR 59 | Temp 98.2°F | Resp 16 | Ht 68.0 in | Wt 200.0 lb

## 2023-07-14 DIAGNOSIS — D494 Neoplasm of unspecified behavior of bladder: Secondary | ICD-10-CM | POA: Diagnosis not present

## 2023-07-14 DIAGNOSIS — Z01812 Encounter for preprocedural laboratory examination: Secondary | ICD-10-CM | POA: Diagnosis not present

## 2023-07-14 DIAGNOSIS — I10 Essential (primary) hypertension: Secondary | ICD-10-CM | POA: Insufficient documentation

## 2023-07-14 DIAGNOSIS — I2510 Atherosclerotic heart disease of native coronary artery without angina pectoris: Secondary | ICD-10-CM | POA: Insufficient documentation

## 2023-07-14 DIAGNOSIS — Z01818 Encounter for other preprocedural examination: Secondary | ICD-10-CM | POA: Diagnosis present

## 2023-07-14 DIAGNOSIS — Z951 Presence of aortocoronary bypass graft: Secondary | ICD-10-CM | POA: Diagnosis not present

## 2023-07-14 DIAGNOSIS — Z79899 Other long term (current) drug therapy: Secondary | ICD-10-CM | POA: Diagnosis not present

## 2023-07-14 HISTORY — DX: Pneumonia, unspecified organism: J18.9

## 2023-07-14 HISTORY — DX: Cardiac murmur, unspecified: R01.1

## 2023-07-14 LAB — CBC
HCT: 45.3 % (ref 39.0–52.0)
Hemoglobin: 14.8 g/dL (ref 13.0–17.0)
MCH: 29.2 pg (ref 26.0–34.0)
MCHC: 32.7 g/dL (ref 30.0–36.0)
MCV: 89.5 fL (ref 80.0–100.0)
Platelets: 227 10*3/uL (ref 150–400)
RBC: 5.06 MIL/uL (ref 4.22–5.81)
RDW: 13.8 % (ref 11.5–15.5)
WBC: 6.4 10*3/uL (ref 4.0–10.5)
nRBC: 0 % (ref 0.0–0.2)

## 2023-07-14 LAB — LIPID PANEL
Chol/HDL Ratio: 2.1 ratio (ref 0.0–5.0)
Cholesterol, Total: 107 mg/dL (ref 100–199)
HDL: 51 mg/dL (ref >39)
LDL Chol Calc (NIH): 43 mg/dL (ref 0–99)
Triglycerides: 55 mg/dL (ref 0–149)
VLDL Cholesterol Cal: 13 mg/dL (ref 5–40)

## 2023-07-14 LAB — BASIC METABOLIC PANEL
Anion gap: 7 (ref 5–15)
BUN: 24 mg/dL — ABNORMAL HIGH (ref 8–23)
CO2: 26 mmol/L (ref 22–32)
Calcium: 9.1 mg/dL (ref 8.9–10.3)
Chloride: 107 mmol/L (ref 98–111)
Creatinine, Ser: 0.84 mg/dL (ref 0.61–1.24)
GFR, Estimated: >60 mL/min (ref >60)
Glucose, Bld: 96 mg/dL (ref 70–99)
Potassium: 5 mmol/L (ref 3.5–5.1)
Sodium: 140 mmol/L (ref 135–145)

## 2023-07-14 NOTE — Progress Notes (Signed)
Anesthesia Chart Review   Case: [id number] Date/Time: 07/18/23 1247   Procedure: TRANSURETHRAL RESECTION OF BLADDER TUMOR (TURBT) (Bilateral)   Anesthesia type: General   Pre-op diagnosis: bladder tumor   [facility]: [facility] ROOM 03 / [facility]   Surgeons: [name], MD       DISCUSSION:66 y.o. never smoker with h/o HTN, CAD (CABG), bladder tumor scheduled for above procedure 07/18/2023 with Dr. Marcha Solders.   Patient last seen by cardiology 07/05/2023.  Per office visit note patient asymptomatic, denies chest pain, blood pressure control.  Echo ordered for evaluation of questionable mild aortic stenosis.  This is currently scheduled for 07/25/2023.  Discussed with Dr. Maple Hudson, ok to proceed.  VS: BP 132/74   Pulse (!) 59   Temp 36.8 C (Oral)   Resp 16   Ht 5\' 8"  (1.727 m)   Wt 90.7 kg   SpO2 98%   BMI 30.41 kg/m   PROVIDERS: Monica Becton, MD is PCP    LABS: Labs reviewed: Acceptable for surgery. (all labs ordered are listed, but only abnormal results are displayed)  Labs Reviewed  BASIC METABOLIC PANEL - Abnormal; Notable for the following components:      Result Value   BUN 24 (*)    All other components within normal limits  CBC     IMAGES:   EKG:   CV: Echo 05/09/2017  Left ventricle: Normal cavity size and wall thickness. LV systolic  function is normal with an EF of 55-60%. There are no obvious wall motion  abnormalities. No thrombus present. No mass present.   Mitral valve: Trace regurgitation.   Right ventricle: Normal cavity size, wall thickness and ejection  fraction.   Tricuspid valve: Trace regurgitation.  Past Medical History:  Diagnosis Date   Asthma    AS A CHILD   Basal cell carcinoma of nasal tip 08/24/2012   Coronary artery disease    a. Abnl stress test -> s/p CABGx4 in 04/2017 with LIMA-LAD, SVG-diag, SVG-AM, SVG-PDA.   Heart murmur    Hyperlipidemia    Hypertension    Male hypogonadism 08/24/2012   Onychomycosis 08/24/2012    Pneumonia     Past Surgical History:  Procedure Laterality Date   CARDIAC CATHETERIZATION  05/08/2017   CARDIOVASCULAR STRESS TEST  04/2017   CORONARY ARTERY BYPASS GRAFT N/A 05/09/2017   Procedure: CORONARY ARTERY BYPASS GRAFTING times four using left internal mammary artery and right leg saphenous vein  ;  Surgeon: Alleen Borne, MD;  Location: [facility] OR;  Service: Open Heart Surgery;  Laterality: N/A;   LEFT HEART CATH AND CORONARY ANGIOGRAPHY N/A 05/08/2017   Procedure: Left Heart Cath and Coronary Angiography;  Surgeon: Lennette Bihari, MD;  Location: [facility] INVASIVE CV LAB;  Service: Cardiovascular;  Laterality: N/A;   TEE WITHOUT CARDIOVERSION N/A 05/09/2017   Procedure: TRANSESOPHAGEAL ECHOCARDIOGRAM (TEE);  Surgeon: Alleen Borne, MD;  Location: [facility] OR;  Service: Open Heart Surgery;  Laterality: N/A;   TONSILLECTOMY  1968    MEDICATIONS:  amLODipine (NORVASC) 5 MG tablet   aspirin EC 81 MG tablet   atorvastatin (LIPITOR) 80 MG tablet   cefdinir (OMNICEF) 300 MG capsule   metoprolol succinate (TOPROL-XL) 25 MG 24 hr tablet   tamsulosin (FLOMAX) 0.4 MG CAPS capsule   No current facility-administered medications for this encounter.    Jodell Cipro Ward, PA-C [facility] Pre-Surgical Testing [phone]

## 2023-07-18 ENCOUNTER — Ambulatory Visit (HOSPITAL_COMMUNITY): Payer: Medicare HMO | Admitting: Physician Assistant

## 2023-07-18 ENCOUNTER — Ambulatory Visit (HOSPITAL_COMMUNITY)
Admission: RE | Admit: 2023-07-18 | Discharge: 2023-07-18 | Disposition: A | Discharge status: Home / Self Care | Payer: Medicare HMO | Attending: Urology | Admitting: Urology

## 2023-07-18 ENCOUNTER — Encounter (HOSPITAL_COMMUNITY)
Admission: RE | Disposition: A | Discharge status: Home / Self Care | Payer: Self-pay | Source: Home / Self Care | Attending: Urology

## 2023-07-18 ENCOUNTER — Encounter (HOSPITAL_COMMUNITY): Payer: Self-pay | Admitting: Urology

## 2023-07-18 ENCOUNTER — Ambulatory Visit (HOSPITAL_BASED_OUTPATIENT_CLINIC_OR_DEPARTMENT_OTHER): Payer: Medicare HMO

## 2023-07-18 DIAGNOSIS — I10 Essential (primary) hypertension: Secondary | ICD-10-CM

## 2023-07-18 DIAGNOSIS — C679 Malignant neoplasm of bladder, unspecified: Secondary | ICD-10-CM

## 2023-07-18 DIAGNOSIS — D494 Neoplasm of unspecified behavior of bladder: Secondary | ICD-10-CM | POA: Diagnosis not present

## 2023-07-18 DIAGNOSIS — R310 Gross hematuria: Secondary | ICD-10-CM | POA: Diagnosis not present

## 2023-07-18 DIAGNOSIS — E785 Hyperlipidemia, unspecified: Secondary | ICD-10-CM

## 2023-07-18 DIAGNOSIS — C678 Malignant neoplasm of overlapping sites of bladder: Secondary | ICD-10-CM | POA: Diagnosis not present

## 2023-07-18 DIAGNOSIS — Z951 Presence of aortocoronary bypass graft: Secondary | ICD-10-CM | POA: Insufficient documentation

## 2023-07-18 DIAGNOSIS — I2510 Atherosclerotic heart disease of native coronary artery without angina pectoris: Secondary | ICD-10-CM | POA: Diagnosis not present

## 2023-07-18 DIAGNOSIS — N401 Enlarged prostate with lower urinary tract symptoms: Secondary | ICD-10-CM | POA: Insufficient documentation

## 2023-07-18 HISTORY — PX: TRANSURETHRAL RESECTION OF BLADDER TUMOR: SHX2575

## 2023-07-18 SURGERY — TURBT (TRANSURETHRAL RESECTION OF BLADDER TUMOR)
Anesthesia: General | Site: Bladder | Laterality: Bilateral

## 2023-07-18 MED ORDER — FENTANYL CITRATE PF 50 MCG/ML IJ SOSY: 25.0000 ug | PREFILLED_SYRINGE | INTRAMUSCULAR | Status: DC | PRN

## 2023-07-18 MED ORDER — PROPOFOL 10 MG/ML IV BOLUS
INTRAVENOUS | Status: AC
  Filled 2023-07-18: qty 20

## 2023-07-18 MED ORDER — MIDAZOLAM HCL 5 MG/5ML IJ SOLN
INTRAMUSCULAR | Status: DC | PRN
  Administered 2023-07-18: 2 mg via INTRAVENOUS

## 2023-07-18 MED ORDER — FENTANYL CITRATE (PF) 100 MCG/2ML IJ SOLN
INTRAMUSCULAR | Status: AC
  Filled 2023-07-18: qty 2

## 2023-07-18 MED ORDER — DEXAMETHASONE SODIUM PHOSPHATE 10 MG/ML IJ SOLN
INTRAMUSCULAR | Status: AC
  Filled 2023-07-18: qty 1

## 2023-07-18 MED ORDER — CHLORHEXIDINE GLUCONATE 0.12 % MT SOLN
15.0000 mL | Freq: Once | OROMUCOSAL | Status: AC
  Administered 2023-07-18: 15 mL via OROMUCOSAL

## 2023-07-18 MED ORDER — 0.9 % SODIUM CHLORIDE (POUR BTL) OPTIME
TOPICAL | Status: DC | PRN
Start: 2023-07-18 — End: 2023-07-18
  Administered 2023-07-18: 1000 mL

## 2023-07-18 MED ORDER — SODIUM CHLORIDE 0.9 % IR SOLN
Status: DC | PRN
  Administered 2023-07-18 (×3): 6000 mL

## 2023-07-18 MED ORDER — PROPOFOL 10 MG/ML IV BOLUS
INTRAVENOUS | Status: DC | PRN
  Administered 2023-07-18: 140 mg via INTRAVENOUS

## 2023-07-18 MED ORDER — ONDANSETRON HCL 4 MG/2ML IJ SOLN
INTRAMUSCULAR | Status: DC | PRN
  Administered 2023-07-18: 4 mg via INTRAVENOUS

## 2023-07-18 MED ORDER — ROCURONIUM BROMIDE 10 MG/ML (PF) SYRINGE
PREFILLED_SYRINGE | INTRAVENOUS | Status: AC
  Filled 2023-07-18: qty 10

## 2023-07-18 MED ORDER — SUGAMMADEX SODIUM 200 MG/2ML IV SOLN
INTRAVENOUS | Status: DC | PRN
Start: 2023-07-18 — End: 2023-07-18
  Administered 2023-07-18: 400 mg via INTRAVENOUS

## 2023-07-18 MED ORDER — ORAL CARE MOUTH RINSE: 15.0000 mL | Freq: Once | OROMUCOSAL | Status: AC

## 2023-07-18 MED ORDER — ACETAMINOPHEN 500 MG PO TABS
1000.0000 mg | ORAL_TABLET | Freq: Once | ORAL | Status: AC
  Administered 2023-07-18: 1000 mg via ORAL
  Filled 2023-07-18: qty 2

## 2023-07-18 MED ORDER — PHENYLEPHRINE 80 MCG/ML (10ML) SYRINGE FOR IV PUSH (FOR BLOOD PRESSURE SUPPORT)
PREFILLED_SYRINGE | INTRAVENOUS | Status: DC | PRN
  Administered 2023-07-18: 40 ug via INTRAVENOUS
  Administered 2023-07-18: 80 ug via INTRAVENOUS
  Administered 2023-07-18: 100 ug via INTRAVENOUS

## 2023-07-18 MED ORDER — LACTATED RINGERS IV SOLN: INTRAVENOUS | Status: DC

## 2023-07-18 MED ORDER — FENTANYL CITRATE (PF) 100 MCG/2ML IJ SOLN
INTRAMUSCULAR | Status: DC | PRN
  Administered 2023-07-18 (×2): 25 ug via INTRAVENOUS
  Administered 2023-07-18 (×3): 50 ug via INTRAVENOUS
  Administered 2023-07-18 (×2): 25 ug via INTRAVENOUS

## 2023-07-18 MED ORDER — ONDANSETRON HCL 4 MG/2ML IJ SOLN
INTRAMUSCULAR | Status: AC
  Filled 2023-07-18: qty 2

## 2023-07-18 MED ORDER — MIDAZOLAM HCL 2 MG/2ML IJ SOLN
INTRAMUSCULAR | Status: AC
  Filled 2023-07-18: qty 2

## 2023-07-18 MED ORDER — PHENYLEPHRINE 80 MCG/ML (10ML) SYRINGE FOR IV PUSH (FOR BLOOD PRESSURE SUPPORT)
PREFILLED_SYRINGE | INTRAVENOUS | Status: AC
  Filled 2023-07-18: qty 10

## 2023-07-18 MED ORDER — DEXAMETHASONE SODIUM PHOSPHATE 4 MG/ML IJ SOLN
INTRAMUSCULAR | Status: DC | PRN
Start: 2023-07-18 — End: 2023-07-18
  Administered 2023-07-18: 4 mg via INTRAVENOUS

## 2023-07-18 MED ORDER — ROCURONIUM BROMIDE 100 MG/10ML IV SOLN
INTRAVENOUS | Status: DC | PRN
  Administered 2023-07-18: 15 mg via INTRAVENOUS
  Administered 2023-07-18: 10 mg via INTRAVENOUS
  Administered 2023-07-18: 30 mg via INTRAVENOUS
  Administered 2023-07-18: 15 mg via INTRAVENOUS
  Administered 2023-07-18: 10 mg via INTRAVENOUS

## 2023-07-18 MED ORDER — ONDANSETRON HCL 4 MG/2ML IJ SOLN: 4.0000 mg | Freq: Once | INTRAMUSCULAR | Status: DC | PRN

## 2023-07-18 MED ORDER — SODIUM CHLORIDE 0.9 % IV SOLN
1.0000 g | INTRAVENOUS | Status: AC
  Administered 2023-07-18: 1 g via INTRAVENOUS
  Filled 2023-07-18: qty 10

## 2023-07-18 SURGICAL SUPPLY — 18 items
BAG DRN RND TRDRP ANRFLXCHMBR (UROLOGICAL SUPPLIES)
BAG URINE DRAIN 2000ML AR STRL (UROLOGICAL SUPPLIES) IMPLANT
BAG URO CATCHER STRL LF (MISCELLANEOUS) ×1 IMPLANT
DRAPE FOOT SWITCH (DRAPES) ×1 IMPLANT
ELECT REM PT RETURN 15FT ADLT (MISCELLANEOUS) ×1 IMPLANT
EVACUATOR MICROVAS BLADDER (UROLOGICAL SUPPLIES) IMPLANT
GLOVE BIO SURGEON STRL SZ8.5 (GLOVE) ×1 IMPLANT
GOWN STRL REUS W/ TWL XL LVL3 (GOWN DISPOSABLE) ×1 IMPLANT
GOWN STRL REUS W/TWL XL LVL3 (GOWN DISPOSABLE) ×1
KIT TURNOVER KIT A (KITS) IMPLANT
LOOP CUT BIPOLAR 24F LRG (ELECTROSURGICAL) IMPLANT
MANIFOLD NEPTUNE II (INSTRUMENTS) ×1 IMPLANT
PACK CYSTO (CUSTOM PROCEDURE TRAY) ×1 IMPLANT
PLUG CATH AND CAP STRL 200 (CATHETERS) ×1 IMPLANT
SYR TOOMEY IRRIG 70ML (MISCELLANEOUS)
SYRINGE TOOMEY IRRIG 70ML (MISCELLANEOUS) IMPLANT
TUBING CONNECTING 10 (TUBING) ×1 IMPLANT
TUBING UROLOGY SET (TUBING) ×1 IMPLANT

## 2023-07-18 NOTE — Anesthesia Procedure Notes (Signed)
Procedure Name: Intubation Date/Time: 07/18/2023 1:12 PM  Performed by: Ahmed Prima, CRNAPre-anesthesia Checklist: Patient identified, Emergency Drugs available, Suction available and Patient being monitored Patient Re-evaluated:Patient Re-evaluated prior to induction Oxygen Delivery Method: Circle system utilized Preoxygenation: Pre-oxygenation with 100% oxygen Induction Type: IV induction Ventilation: Mask ventilation without difficulty and Oral airway inserted - appropriate to patient size Laryngoscope Size: Hyacinth Meeker and 2 Grade View: Grade III Tube type: Oral Tube size: 7.5 mm Number of attempts: 1 Airway Equipment and Method: Stylet and Oral airway Placement Confirmation: ETT inserted through vocal cords under direct vision, positive ETCO2 and breath sounds checked- equal and bilateral Secured at: 22 cm Tube secured with: Tape Dental Injury: Teeth and Oropharynx as per pre-operative assessment

## 2023-07-18 NOTE — Anesthesia Preprocedure Evaluation (Addendum)
Anesthesia Evaluation  Patient identified by MRN, date of birth, ID band Patient awake    Reviewed: Allergy & Precautions, NPO status , Patient's Chart, lab work & pertinent test results, reviewed documented beta blocker date and time   Airway Mallampati: II  TM Distance: >3 FB Neck ROM: Full    Dental  (+) Teeth Intact, Dental Advisory Given   Pulmonary asthma    Pulmonary exam normal breath sounds clear to auscultation       Cardiovascular hypertension, Pt. on home beta blockers and Pt. on medications + CAD and + CABG  Normal cardiovascular exam Rhythm:Regular Rate:Normal     Neuro/Psych negative neurological ROS     GI/Hepatic negative GI ROS, Neg liver ROS,,,  Endo/Other  Obesity   Renal/GU negative Renal ROS   Bladder tumor     Musculoskeletal negative musculoskeletal ROS (+)    Abdominal   Peds  Hematology negative hematology ROS (+)   Anesthesia Other Findings Day of surgery medications reviewed with the patient.    Reproductive/Obstetrics                              Anesthesia Physical Anesthesia Plan  ASA: 3  Anesthesia Plan: General   Post-op Pain Management: Tylenol PO (pre-op)*   Induction: Intravenous  PONV Risk Score and Plan: 3 and Midazolam, Dexamethasone and Ondansetron  Airway Management Planned: Oral ETT  Additional Equipment:   Intra-op Plan:   Post-operative Plan: Extubation in OR  Informed Consent: I have reviewed the patients History and Physical, chart, labs and discussed the procedure including the risks, benefits and alternatives for the proposed anesthesia with the patient or authorized representative who has indicated his/her understanding and acceptance.     Dental advisory given  Plan Discussed with: CRNA  Anesthesia Plan Comments:          Anesthesia Quick Evaluation

## 2023-07-18 NOTE — Interval H&P Note (Signed)
History and Physical Interval Note:  07/18/2023 12:57 PM  D[facility]ic Ray  has presented today for surgery, with the diagnosis of bladder tumor.  The various methods of treatment have been discussed with the patient and family. After consideration of risks, benefits and other options for treatment, the patient has consented to  Procedure(s): TRANSURETHRAL RESECTION OF BLADDER TUMOR (TURBT) (Bilateral) as a surgical intervention.  The patient's history has been reviewed, patient examined, no change in status, stable for surgery.  I have reviewed the patient's chart and labs.  Questions were answered to the patient's satisfaction.     [name]

## 2023-07-18 NOTE — Anesthesia Postprocedure Evaluation (Signed)
Anesthesia Post Note  Patient: Dominic Ray  Procedure(s) Performed: TRANSURETHRAL RESECTION OF BLADDER TUMOR (TURBT) (Bilateral: Bladder)     Patient location during evaluation: PACU Anesthesia Type: General Level of consciousness: awake and alert Pain management: pain level controlled Vital Signs Assessment: post-procedure vital signs reviewed and stable Respiratory status: spontaneous breathing, nonlabored ventilation, respiratory function stable and patient connected to nasal cannula oxygen Cardiovascular status: blood pressure returned to baseline and stable Postop Assessment: no apparent nausea or vomiting Anesthetic complications: no  No notable events documented.  Last Vitals:  Vitals:   07/18/23 1530 07/18/23 1545  BP: (!) 149/71 (!) 158/68  Pulse: (!) 54 (!) 57  Resp: 13 12  Temp:  36.6 C  SpO2: 95% 96%    Last Pain:  Vitals:   07/18/23 1545  TempSrc:   PainSc: 0-No pain                 Yahayra Geis L Abdiel Blackerby

## 2023-07-18 NOTE — Op Note (Addendum)
OPERATIVE NOTE   Patient Name: Dominic Ray  MRN: [id number]   Date of Procedure: 07/18/2023   Preoperative diagnosis:  Bladder tumor  Postoperative diagnosis:  same  Procedure:  Cystoscopy, transurethral resection of bladder tumor (large greater than 5 cm occupying trigone right bladder floor and right anterior bladder wall)  Attending: Concepcion Living, MD  Anesthesia: geta  Estimated blood loss:  Fluids: Per anesthesia record  Drains: 22 Fr Foley  Specimens: bladder tumor chips  Antibiotics: rocephin 1gm iv  Findings: large friable bladder tumor involving bladder neck, trigone, and right bladder wall  Indications:  Bladder tumor  Description of Procedure:  Patient was brought to the operating room where he was correctly identified and underwent successful induction of general endotracheal anesthesia.  Patient was then positioned in the modified dorsolithotomy position prepped and draped in usual sterile fashion.  Flexible cystoscopy was initially performed with a 23 [place] panendoscope.  The anterior urethra appeared normal.  There was a mild bulbar stricture that the scope passed easily through.  The prostatic urethra revealed significant lateral lobe enlargement as well as a protruding median lobe which was very friable and bled easily.  Bladder was subsequently entered and carefully inspected.  There was noted to be active bleeding from a sheet of tumor involving the right lateral wall as well as from the bladder neck.  The tumor appears solid and was very friable.  It was well over 6 cm and involve the trigone extending to the bladder neck and creating a shelf of tumor at the bladder neck from the 5:00 to the 10 o'clock position.  It then extended proximally on the left right lateral wall of the bladder.  The left ureteral orifice was easily identified and could be seen effluxing clear urine.  I could never identify the right ureteral orifice.  The cystoscope  was subsequently removed and I dilated the urethra to 23 [place] with successive [employer].  I then passed the 25 French continuous-flow resectoscope with visual obturator.  I used the gyrus bipolar apparatus with saline as irrigant.  The tumor again was very friable and I resected the tumor from the right lateral wall all the way to the right trigone.  I used the fulguration current for hemostasis.  I subsequently resected tumor near the bladder neck region which again formed a shelf of tumor that was worry some for invasion of the bladder neck.  This shelf of tumor extending from the 5 o'clock position to the 10 o'clock position.  After resecting the tumor and obtaining hemostasis I used a Ilich evacuator to remove the bladder chips.  Hemostasis was again verified.  At this time emanation under anesthesia revealed significant bladder neck thickening on the right as well as a large prostate.  A 22 French three-way Foley was subsequently placed with clear returns.  The three-way port was plugged.  The patient was subsequently extubated and taken to the recovery room in satisfactory condition.   Complications: None  Condition: Stable, extubated, transferred to PACU  Plan: FU 1 week

## 2023-07-18 NOTE — Transfer of Care (Signed)
Immediate Anesthesia Transfer of Care Note  Patient: Dominic Ray  Procedure(s) Performed: TRANSURETHRAL RESECTION OF BLADDER TUMOR (TURBT) (Bilateral: Bladder)  Patient Location: PACU  Anesthesia Type:General  Level of Consciousness: drowsy  Airway & Oxygen Therapy: Patient Spontanous Breathing and Patient connected to face mask oxygen  Post-op Assessment: Report given to RN and Post -op Vital signs reviewed and stable  Post vital signs: Reviewed and stable  Last Vitals:  Vitals Value Taken Time  BP 146/83   Temp    Pulse 67 07/18/23 1455  Resp 16 07/18/23 1455  SpO2 95 % 07/18/23 1455  Vitals shown include unfiled device data.  Last Pain:  Vitals:   07/18/23 1113  TempSrc:   PainSc: 0-No pain      Patients Stated Pain Goal: 4 (07/18/23 1113)  Complications: No notable events documented.

## 2023-07-19 ENCOUNTER — Telehealth: Payer: Self-pay | Admitting: Urology

## 2023-07-19 ENCOUNTER — Encounter (HOSPITAL_COMMUNITY): Payer: Self-pay | Admitting: Urology

## 2023-07-19 NOTE — Telephone Encounter (Signed)
There was a cath left in, is he ok to take showers? can he wash around leg and groin area? Is he allowed to sleep on his side?   Has more questions but says he will ask when we call him.

## 2023-07-19 NOTE — Telephone Encounter (Signed)
Spoke with patient about basic cath care. Pt verbalized understanding.

## 2023-07-20 LAB — SURGICAL PATHOLOGY

## 2023-07-21 ENCOUNTER — Other Ambulatory Visit: Payer: Self-pay | Admitting: Physician Assistant

## 2023-07-21 LAB — URINALYSIS, ROUTINE W REFLEX MICROSCOPIC
Bilirubin, UA: NEGATIVE
Glucose, UA: NEGATIVE
Ketones, UA: NEGATIVE
Leukocytes,UA: NEGATIVE
Nitrite, UA: NEGATIVE
Specific Gravity, UA: >1.03 — ABNORMAL HIGH (ref 1.005–1.030)
Urobilinogen, Ur: 0.2 mg/dL (ref 0.2–1.0)
pH, UA: 5.5 (ref 5.0–7.5)

## 2023-07-21 LAB — MICROSCOPIC EXAMINATION: RBC, Urine: >30 /HPF — AB (ref 0–2)

## 2023-07-24 ENCOUNTER — Encounter: Payer: Self-pay | Admitting: Urology

## 2023-07-24 ENCOUNTER — Ambulatory Visit: Payer: Self-pay | Admitting: Urology

## 2023-07-24 ENCOUNTER — Ambulatory Visit (INDEPENDENT_AMBULATORY_CARE_PROVIDER_SITE_OTHER): Payer: Medicare HMO | Admitting: Urology

## 2023-07-24 VITALS — BP 157/81 | HR 75

## 2023-07-24 DIAGNOSIS — Z2989 Encounter for other specified prophylactic measures: Secondary | ICD-10-CM

## 2023-07-24 DIAGNOSIS — R310 Gross hematuria: Secondary | ICD-10-CM

## 2023-07-24 DIAGNOSIS — C679 Malignant neoplasm of bladder, unspecified: Secondary | ICD-10-CM | POA: Diagnosis not present

## 2023-07-24 DIAGNOSIS — D494 Neoplasm of unspecified behavior of bladder: Secondary | ICD-10-CM

## 2023-07-24 DIAGNOSIS — C678 Malignant neoplasm of overlapping sites of bladder: Secondary | ICD-10-CM

## 2023-07-24 MED ORDER — CEFTRIAXONE SODIUM 1 G IJ SOLR
1.0000 g | Freq: Once | INTRAMUSCULAR | Status: AC
  Administered 2023-07-24: 1 g via INTRAMUSCULAR

## 2023-07-24 NOTE — Progress Notes (Signed)
Assessment: 1. Bladder tumor   2. Gross hematuria     Plan: Foley removed today  Today had a long and detailed discussion with the patient and his sister regarding his high risk nonmuscle invasive bladder cancer.  At this time I have recommended a repeat TURBT for more accurate clinical staging prior to making definitive treatment recommendations.  Rationale as well as nature procedure reviewed in detail today.  Patient is expresses understanding and agrees to proceed.  Will schedule in approximately 4 weeks.  Chief Complaint: No chief complaint on file.   HPI: Dominic Ray is a 67 y.o. male who presents for continued evaluation of bladder cancer. Patient also has longstanding BPH/lower urinary tract symptoms with a large prostate estimated to be over 70 g in volume. Patient is here today for Foley catheter removal   Bladder cancer summary: Status post TURBT 07/18/2023-pathology high-grade T1   Portions of the above documentation were copied from a prior visit for review purposes only.  Allergies: Allergies  Allergen Reactions   Anesthetics, Amide    [name] [Lidocaine] Rash    Anything ending in (-caine) per patient     PMH: Past Medical History:  Diagnosis Date   Asthma    AS A CHILD   Basal cell carcinoma of nasal tip 08/24/2012   Coronary artery disease    a. Abnl stress test -> s/p CABGx4 in 04/2017 with LIMA-LAD, SVG-diag, SVG-AM, SVG-PDA.   Heart murmur    Hyperlipidemia    Hypertension    Male hypogonadism 08/24/2012   Onychomycosis 08/24/2012   Pneumonia     PSH: Past Surgical History:  Procedure Laterality Date   CARDIAC CATHETERIZATION  05/08/2017   CARDIOVASCULAR STRESS TEST  04/2017   CORONARY ARTERY BYPASS GRAFT N/A 05/09/2017   Procedure: CORONARY ARTERY BYPASS GRAFTING times four using left internal mammary artery and right leg saphenous vein  ;  Surgeon: Alleen Borne, MD;  Location: [facility] OR;  Service: Open Heart Surgery;  Laterality: N/A;   LEFT  HEART CATH AND CORONARY ANGIOGRAPHY N/A 05/08/2017   Procedure: Left Heart Cath and Coronary Angiography;  Surgeon: Lennette Bihari, MD;  Location: [facility] INVASIVE CV LAB;  Service: Cardiovascular;  Laterality: N/A;   TEE WITHOUT CARDIOVERSION N/A 05/09/2017   Procedure: TRANSESOPHAGEAL ECHOCARDIOGRAM (TEE);  Surgeon: Alleen Borne, MD;  Location: [facility] OR;  Service: Open Heart Surgery;  Laterality: N/A;   TONSILLECTOMY  1968   TRANSURETHRAL RESECTION OF BLADDER TUMOR Bilateral 07/18/2023   Procedure: TRANSURETHRAL RESECTION OF BLADDER TUMOR (TURBT);  Surgeon: [name], MD;  Location: [facility] ORS;  Service: Urology;  Laterality: Bilateral;    SH: Social History   Tobacco Use   Smoking status: Never   Smokeless tobacco: Never  Vaping Use   Vaping status: Never Used  Substance Use Topics   Alcohol use: Yes    Comment: Occasional   Drug use: No    ROS: Constitutional:  Negative for fever, chills, weight loss CV: Negative for chest pain, previous MI, hypertension Respiratory:  Negative for shortness of breath, wheezing, sleep apnea, frequent cough GI:  Negative for nausea, vomiting, bloody stool, GERD  PE: BP (!) 157/81   Pulse 75  GENERAL APPEARANCE:  Well appearing, well developed, well nourished, NAD HEENT:  Atraumatic, normocephalic, oropharynx clear COR:  RR LUNGS:  CTA ABDOMEN:  Soft, non-tender, no masses EXTREMITIES:  Moves all extremities well, without clubbing, cyanosis, or edema NEUROLOGIC:  Alert and oriented x 3, normal gait, CN II-XII  grossly intact MENTAL STATUS:  appropriate BACK:  Non-tender to palpation, No CVAT SKIN:  Warm, dry, and intact

## 2023-07-24 NOTE — Progress Notes (Signed)
IM Injection  Patient is present today for an IM Injection for treatment of taking out catheter.  Drug: Ceftriaxone Dose:1gm Location:left glute Lot: [id number] Exp:05/2025  Patient tolerated well, no complications were noted  Performed by: Shawnique Mariotti N., CMA(AAMA)

## 2023-07-24 NOTE — H&P (View-Only) (Signed)
Assessment: 1. Bladder tumor   2. Gross hematuria     Plan: Foley removed today  Today had a long and detailed discussion with the patient and his sister regarding his high risk nonmuscle invasive bladder cancer.  At this time I have recommended a repeat TURBT for more accurate clinical staging prior to making definitive treatment recommendations.  Rationale as well as nature procedure reviewed in detail today.  Patient is expresses understanding and agrees to proceed.  Will schedule in approximately 4 weeks.  Chief Complaint: No chief complaint on file.   HPI: Dominic Ray is a 67 y.o. male who presents for continued evaluation of bladder cancer. Patient also has longstanding BPH/lower urinary tract symptoms with a large prostate estimated to be over 70 g in volume. Patient is here today for Foley catheter removal   Bladder cancer summary: Status post TURBT 07/18/2023-pathology high-grade T1   Portions of the above documentation were copied from a prior visit for review purposes only.  Allergies: Allergies  Allergen Reactions   Anesthetics, Amide    [name] [Lidocaine] Rash    Anything ending in (-caine) per patient     PMH: Past Medical History:  Diagnosis Date   Asthma    AS A CHILD   Basal cell carcinoma of nasal tip 08/24/2012   Coronary artery disease    a. Abnl stress test -> s/p CABGx4 in 04/2017 with LIMA-LAD, SVG-diag, SVG-AM, SVG-PDA.   Heart murmur    Hyperlipidemia    Hypertension    Male hypogonadism 08/24/2012   Onychomycosis 08/24/2012   Pneumonia     PSH: Past Surgical History:  Procedure Laterality Date   CARDIAC CATHETERIZATION  05/08/2017   CARDIOVASCULAR STRESS TEST  04/2017   CORONARY ARTERY BYPASS GRAFT N/A 05/09/2017   Procedure: CORONARY ARTERY BYPASS GRAFTING times four using left internal mammary artery and right leg saphenous vein  ;  Surgeon: Alleen Borne, MD;  Location: [facility] OR;  Service: Open Heart Surgery;  Laterality: N/A;   LEFT  HEART CATH AND CORONARY ANGIOGRAPHY N/A 05/08/2017   Procedure: Left Heart Cath and Coronary Angiography;  Surgeon: Lennette Bihari, MD;  Location: [facility] INVASIVE CV LAB;  Service: Cardiovascular;  Laterality: N/A;   TEE WITHOUT CARDIOVERSION N/A 05/09/2017   Procedure: TRANSESOPHAGEAL ECHOCARDIOGRAM (TEE);  Surgeon: Alleen Borne, MD;  Location: [facility] OR;  Service: Open Heart Surgery;  Laterality: N/A;   TONSILLECTOMY  1968   TRANSURETHRAL RESECTION OF BLADDER TUMOR Bilateral 07/18/2023   Procedure: TRANSURETHRAL RESECTION OF BLADDER TUMOR (TURBT);  Surgeon: [name], MD;  Location: [facility] ORS;  Service: Urology;  Laterality: Bilateral;    SH: Social History   Tobacco Use   Smoking status: Never   Smokeless tobacco: Never  Vaping Use   Vaping status: Never Used  Substance Use Topics   Alcohol use: Yes    Comment: Occasional   Drug use: No    ROS: Constitutional:  Negative for fever, chills, weight loss CV: Negative for chest pain, previous MI, hypertension Respiratory:  Negative for shortness of breath, wheezing, sleep apnea, frequent cough GI:  Negative for nausea, vomiting, bloody stool, GERD  PE: BP (!) 157/81   Pulse 75  GENERAL APPEARANCE:  Well appearing, well developed, well nourished, NAD HEENT:  Atraumatic, normocephalic, oropharynx clear COR:  RR LUNGS:  CTA ABDOMEN:  Soft, non-tender, no masses EXTREMITIES:  Moves all extremities well, without clubbing, cyanosis, or edema NEUROLOGIC:  Alert and oriented x 3, normal gait, CN II-XII  grossly intact MENTAL STATUS:  appropriate BACK:  Non-tender to palpation, No CVAT SKIN:  Warm, dry, and intact

## 2023-07-25 ENCOUNTER — Ambulatory Visit (HOSPITAL_BASED_OUTPATIENT_CLINIC_OR_DEPARTMENT_OTHER)
Admission: RE | Admit: 2023-07-25 | Discharge: 2023-07-25 | Disposition: A | Discharge status: Home / Self Care | Payer: Medicare HMO | Source: Ambulatory Visit | Attending: Cardiology | Admitting: Cardiology

## 2023-07-25 DIAGNOSIS — R011 Cardiac murmur, unspecified: Secondary | ICD-10-CM | POA: Diagnosis not present

## 2023-07-25 LAB — ECHOCARDIOGRAM COMPLETE
AR max vel: 1.34 cm2
AV Area VTI: 1.58 cm2
AV Area mean vel: 1.52 cm2
AV Mean grad: 14 mmHg
AV Peak grad: 30.1 mmHg
AV Vena cont: 0.3 cm
Ao pk vel: 2.74 m/s
Area-P 1/2: 2.99 cm2
Calc EF: 54.6 %
MV M vel: 4.38 m/s
MV Peak grad: 76.6 mmHg
P 1/2 time: 729 ms
S' Lateral: 3.1 cm
Single Plane A2C EF: 57.5 %
Single Plane A4C EF: 51.9 %

## 2023-07-26 ENCOUNTER — Encounter: Payer: Medicare HMO | Admitting: Urology

## 2023-07-31 ENCOUNTER — Encounter: Payer: Self-pay | Admitting: *Deleted

## 2023-08-09 ENCOUNTER — Telehealth: Payer: Self-pay | Admitting: Urology

## 2023-08-09 NOTE — Telephone Encounter (Signed)
Patient needs to schedule another TURBT> Called and scheduled a Cysto appt that I just cancelled. He called back saying that he made a mistake and doesn't need the cysto and needs something else scheduled.please call him back to schedule.

## 2023-08-16 ENCOUNTER — Telehealth: Payer: Self-pay | Admitting: Urology

## 2023-08-16 NOTE — Telephone Encounter (Signed)
Procedure coming up on Tuesday and has found blood in his urine. Wants to know if this will effect procedure.

## 2023-08-16 NOTE — Patient Instructions (Signed)
SURGICAL WAITING ROOM VISITATION  Patients having surgery or a procedure may have no more than 2 support people in the waiting area - these visitors may rotate.    Children under the age of 13 must have an adult with them who is not the patient.  Due to an increase in RSV and influenza rates and associated hospitalizations, children ages 7 and under may not visit patients in [facility] hospitals.  If the patient needs to stay at the hospital during part of their recovery, the visitor guidelines for inpatient rooms apply. Pre-op nurse will coordinate an appropriate time for 1 support person to accompany patient in pre-op.  This support person may not rotate.    Please refer to the [facility] website for the visitor guidelines for Inpatients (after your surgery is over and you are in a regular room).       Your procedure is scheduled on:  08/22/2023    Report to [facility] Main Entrance    Report to admitting at  0900 AM   Call this number if you have problems the morning of surgery [phone]   Do not eat food : or drink liquids After Midnight.                  If you have questions, please contact your surgeon's office.      Oral Hygiene is also important to reduce your risk of infection.                                    Remember - BRUSH YOUR TEETH THE MORNING OF SURGERY WITH YOUR REGULAR TOOTHPASTE  DENTURES WILL BE REMOVED PRIOR TO SURGERY PLEASE DO NOT APPLY "Poly grip" OR ADHESIVES!!!   Do NOT smoke after Midnight   Stop all vitamins and herbal supplements 7 days before surgery.   Take these medicines the morning of surgery with A SIP OF WATER:  amlodipoine, toprol, flomax   DO NOT TAKE ANY ORAL DIABETIC MEDICATIONS DAY OF YOUR SURGERY  Bring CPAP mask and tubing day of surgery.                              You may not have any metal on your body including hair pins, jewelry, and body piercing             Do not wear make-up, lotions, powders,  perfumes/cologne, or deodorant  Do not wear nail polish including gel and S&S, artificial/acrylic nails, or any other type of covering on natural nails including finger and toenails. If you have artificial nails, gel coating, etc. that needs to be removed by a nail salon please have this removed prior to surgery or surgery may need to be canceled/ delayed if the surgeon/ anesthesia feels like they are unable to be safely monitored.   Do not shave  48 hours prior to surgery.               Men may shave face and neck.   Do not bring valuables to the hospital.  IS NOT             RESPONSIBLE   FOR VALUABLES.   Contacts, glasses, dentures or bridgework may not be worn into surgery.   Bring small overnight bag day of surgery.   DO NOT BRING YOUR HOME MEDICATIONS TO THE  HOSPITAL. PHARMACY WILL DISPENSE MEDICATIONS LISTED ON YOUR MEDICATION LIST TO YOU DURING YOUR ADMISSION IN THE HOSPITAL!    Patients discharged on the day of surgery will not be allowed to drive home.  Someone NEEDS to stay with you for the first 24 hours after anesthesia.   Special Instructions: Bring a copy of your healthcare power of attorney and living will documents the day of surgery if you haven't scanned them before.              Please read over the following fact sheets you were given: IF YOU HAVE QUESTIONS ABOUT YOUR PRE-OP INSTRUCTIONS PLEASE CALL [phone]   If you received a COVID test during your pre-op visit  it is requested that you wear a mask when out in public, stay away from anyone that may not be feeling well and notify your surgeon if you develop symptoms. If you test positive for Covid or have been in contact with anyone that has tested positive in the last 10 days please notify you surgeon.    [facility] - Preparing for Surgery Before surgery, you can play an important role.  Because skin is not sterile, your skin needs to be as free of germs as possible.  You can reduce the number of  germs on your skin by washing with CHG (chlorahexidine gluconate) soap before surgery.  CHG is an antiseptic cleaner which kills germs and bonds with the skin to continue killing germs even after washing. Please DO NOT use if you have an allergy to CHG or antibacterial soaps.  If your skin becomes reddened/irritated stop using the CHG and inform your nurse when you arrive at Short Stay. Do not shave (including legs and underarms) for at least 48 hours prior to the first CHG shower.  You may shave your face/neck. Please follow these instructions carefully:  1.  Shower with CHG Soap the night before surgery and the  morning of Surgery.  2.  If you choose to wash your hair, wash your hair first as usual with your  normal  shampoo.  3.  After you shampoo, rinse your hair and body thoroughly to remove the  shampoo.                           4.  Use CHG as you would any other liquid soap.  You can apply chg directly  to the skin and wash                       Gently with a scrungie or clean washcloth.  5.  Apply the CHG Soap to your body ONLY FROM THE NECK DOWN.   Do not use on face/ open                           Wound or open sores. Avoid contact with eyes, ears mouth and genitals (private parts).                       Wash face,  Genitals (private parts) with your normal soap.             6.  Wash thoroughly, paying special attention to the area where your surgery  will be performed.  7.  Thoroughly rinse your body with warm water from the neck down.  8.  DO NOT shower/wash with your normal soap  after using and rinsing off  the CHG Soap.                9.  Pat yourself dry with a clean towel.            10.  Wear clean pajamas.            11.  Place clean sheets on your bed the night of your first shower and do not  sleep with pets. Day of Surgery : Do not apply any lotions/deodorants the morning of surgery.  Please wear clean clothes to the hospital/surgery center.  FAILURE TO FOLLOW THESE  INSTRUCTIONS MAY RESULT IN THE CANCELLATION OF YOUR SURGERY PATIENT SIGNATURE_________________________________  NURSE SIGNATURE__________________________________  ________________________________________________________________________

## 2023-08-16 NOTE — Progress Notes (Signed)
Anesthesia Review:  PCP: Rodney Langton 06/27/23- LOV  Cardiologist : DR Jens Som LOV 07/05/23  Chest x-ray : EKG : 07/05/23  Echo : 07/25/2023  Stress test: 2018  Cardiac Cath :  2018  Activity level: can do a flight of stairs without difficutly  Sleep Study/ CPAP : none  Fasting Blood Sugar :      / Checks Blood Sugar -- times a day:   Blood Thinner/ Instructions /Last Dose: ASA / Instructions/ Last Dose :    81 mg aspirin  07/18/23- TURBT

## 2023-08-17 ENCOUNTER — Encounter (HOSPITAL_COMMUNITY)
Admission: RE | Admit: 2023-08-17 | Discharge: 2023-08-17 | Disposition: A | Discharge status: Home / Self Care | Payer: Medicare HMO | Source: Ambulatory Visit | Attending: Urology | Admitting: Urology

## 2023-08-17 ENCOUNTER — Other Ambulatory Visit: Payer: Self-pay

## 2023-08-17 ENCOUNTER — Encounter (HOSPITAL_COMMUNITY): Payer: Self-pay

## 2023-08-17 VITALS — BP 129/78 | HR 54 | Temp 98.2°F | Resp 16 | Ht 68.0 in | Wt 197.0 lb

## 2023-08-17 DIAGNOSIS — Z01812 Encounter for preprocedural laboratory examination: Secondary | ICD-10-CM | POA: Diagnosis present

## 2023-08-17 DIAGNOSIS — Z01818 Encounter for other preprocedural examination: Secondary | ICD-10-CM

## 2023-08-17 LAB — CBC
HCT: 43.5 % (ref 39.0–52.0)
Hemoglobin: 13.9 g/dL (ref 13.0–17.0)
MCH: 29 pg (ref 26.0–34.0)
MCHC: 32 g/dL (ref 30.0–36.0)
MCV: 90.6 fL (ref 80.0–100.0)
Platelets: 220 10*3/uL (ref 150–400)
RBC: 4.8 MIL/uL (ref 4.22–5.81)
RDW: 13.8 % (ref 11.5–15.5)
WBC: 7.3 10*3/uL (ref 4.0–10.5)
nRBC: 0 % (ref 0.0–0.2)

## 2023-08-17 LAB — BASIC METABOLIC PANEL
Anion gap: 9 (ref 5–15)
BUN: 22 mg/dL (ref 8–23)
CO2: 24 mmol/L (ref 22–32)
Calcium: 8.6 mg/dL — ABNORMAL LOW (ref 8.9–10.3)
Chloride: 105 mmol/L (ref 98–111)
Creatinine, Ser: 0.94 mg/dL (ref 0.61–1.24)
GFR, Estimated: >60 mL/min (ref >60)
Glucose, Bld: 82 mg/dL (ref 70–99)
Potassium: 4.3 mmol/L (ref 3.5–5.1)
Sodium: 138 mmol/L (ref 135–145)

## 2023-08-18 LAB — MICROSCOPIC EXAMINATION: RBC, Urine: >30 /[HPF] — ABNORMAL HIGH (ref 0–2)

## 2023-08-18 LAB — URINALYSIS, ROUTINE W REFLEX MICROSCOPIC
Bilirubin, UA: NEGATIVE
Glucose, UA: NEGATIVE
Ketones, UA: NEGATIVE
Leukocytes,UA: NEGATIVE
Nitrite, UA: NEGATIVE
Specific Gravity, UA: >1.03 — ABNORMAL HIGH (ref 1.005–1.030)
Urobilinogen, Ur: 0.2 mg/dL (ref 0.2–1.0)
pH, UA: 6 (ref 5.0–7.5)

## 2023-08-22 ENCOUNTER — Ambulatory Visit (HOSPITAL_COMMUNITY)
Admission: RE | Admit: 2023-08-22 | Discharge: 2023-08-23 | Disposition: A | Discharge status: Home / Self Care | Payer: Medicare HMO | Source: Ambulatory Visit | Attending: Urology | Admitting: Urology

## 2023-08-22 ENCOUNTER — Encounter (HOSPITAL_COMMUNITY): Payer: Self-pay | Admitting: Urology

## 2023-08-22 ENCOUNTER — Encounter (HOSPITAL_COMMUNITY)
Admission: RE | Disposition: A | Discharge status: Home / Self Care | Payer: Self-pay | Source: Ambulatory Visit | Attending: Urology

## 2023-08-22 ENCOUNTER — Ambulatory Visit (HOSPITAL_COMMUNITY): Payer: Medicare HMO | Admitting: Physician Assistant

## 2023-08-22 ENCOUNTER — Other Ambulatory Visit: Payer: Self-pay

## 2023-08-22 ENCOUNTER — Ambulatory Visit (HOSPITAL_BASED_OUTPATIENT_CLINIC_OR_DEPARTMENT_OTHER): Payer: Medicare HMO | Admitting: Anesthesiology

## 2023-08-22 DIAGNOSIS — J45909 Unspecified asthma, uncomplicated: Secondary | ICD-10-CM | POA: Diagnosis not present

## 2023-08-22 DIAGNOSIS — C678 Malignant neoplasm of overlapping sites of bladder: Secondary | ICD-10-CM

## 2023-08-22 DIAGNOSIS — C679 Malignant neoplasm of bladder, unspecified: Secondary | ICD-10-CM | POA: Diagnosis not present

## 2023-08-22 DIAGNOSIS — Z01818 Encounter for other preprocedural examination: Secondary | ICD-10-CM

## 2023-08-22 DIAGNOSIS — N400 Enlarged prostate without lower urinary tract symptoms: Secondary | ICD-10-CM | POA: Insufficient documentation

## 2023-08-22 DIAGNOSIS — Z951 Presence of aortocoronary bypass graft: Secondary | ICD-10-CM | POA: Diagnosis not present

## 2023-08-22 DIAGNOSIS — I10 Essential (primary) hypertension: Secondary | ICD-10-CM | POA: Insufficient documentation

## 2023-08-22 DIAGNOSIS — D494 Neoplasm of unspecified behavior of bladder: Secondary | ICD-10-CM | POA: Diagnosis not present

## 2023-08-22 DIAGNOSIS — R310 Gross hematuria: Secondary | ICD-10-CM | POA: Insufficient documentation

## 2023-08-22 DIAGNOSIS — I2510 Atherosclerotic heart disease of native coronary artery without angina pectoris: Secondary | ICD-10-CM

## 2023-08-22 HISTORY — PX: TRANSURETHRAL RESECTION OF BLADDER TUMOR: SHX2575

## 2023-08-22 SURGERY — TURBT (TRANSURETHRAL RESECTION OF BLADDER TUMOR)
Anesthesia: General | Laterality: Bilateral

## 2023-08-22 MED ORDER — FENTANYL CITRATE (PF) 100 MCG/2ML IJ SOLN
INTRAMUSCULAR | Status: AC
  Filled 2023-08-22: qty 2

## 2023-08-22 MED ORDER — POTASSIUM CHLORIDE IN NACL 20-0.45 MEQ/L-% IV SOLN
INTRAVENOUS | Status: DC
  Filled 2023-08-22 (×3): qty 1000

## 2023-08-22 MED ORDER — METOPROLOL SUCCINATE ER 25 MG PO TB24
12.5000 mg | ORAL_TABLET | Freq: Every day | ORAL | Status: DC
  Administered 2023-08-22: 12.5 mg via ORAL
  Filled 2023-08-22: qty 1

## 2023-08-22 MED ORDER — FENTANYL CITRATE PF 50 MCG/ML IJ SOSY
25.0000 ug | PREFILLED_SYRINGE | INTRAMUSCULAR | Status: DC | PRN
  Administered 2023-08-22: 50 ug via INTRAVENOUS

## 2023-08-22 MED ORDER — FENTANYL CITRATE (PF) 100 MCG/2ML IJ SOLN
INTRAMUSCULAR | Status: DC | PRN
  Administered 2023-08-22 (×4): 50 ug via INTRAVENOUS

## 2023-08-22 MED ORDER — TAMSULOSIN HCL 0.4 MG PO CAPS
0.4000 mg | ORAL_CAPSULE | Freq: Every day | ORAL | Status: DC
  Administered 2023-08-22: 0.4 mg via ORAL
  Filled 2023-08-22: qty 1

## 2023-08-22 MED ORDER — LIDOCAINE 2% (20 MG/ML) 5 ML SYRINGE
INTRAMUSCULAR | Status: DC | PRN
  Administered 2023-08-22: 40 mg via INTRAVENOUS

## 2023-08-22 MED ORDER — MIDAZOLAM HCL 2 MG/2ML IJ SOLN
INTRAMUSCULAR | Status: DC | PRN
  Administered 2023-08-22: 2 mg via INTRAVENOUS

## 2023-08-22 MED ORDER — FENTANYL CITRATE PF 50 MCG/ML IJ SOSY
PREFILLED_SYRINGE | INTRAMUSCULAR | Status: AC
  Filled 2023-08-22: qty 1

## 2023-08-22 MED ORDER — SODIUM CHLORIDE 0.9 % IV SOLN
1.0000 g | INTRAVENOUS | Status: AC
  Administered 2023-08-22: 2 g via INTRAVENOUS
  Filled 2023-08-22: qty 10

## 2023-08-22 MED ORDER — ROCURONIUM BROMIDE 10 MG/ML (PF) SYRINGE
PREFILLED_SYRINGE | INTRAVENOUS | Status: AC
  Filled 2023-08-22: qty 10

## 2023-08-22 MED ORDER — DEXAMETHASONE SODIUM PHOSPHATE 10 MG/ML IJ SOLN
INTRAMUSCULAR | Status: DC | PRN
  Administered 2023-08-22: 10 mg via INTRAVENOUS

## 2023-08-22 MED ORDER — SODIUM CHLORIDE 0.9 % IR SOLN
Status: DC | PRN
  Administered 2023-08-22: 39000 mL via INTRAVESICAL

## 2023-08-22 MED ORDER — ORAL CARE MOUTH RINSE: 15.0000 mL | Freq: Once | OROMUCOSAL | Status: AC

## 2023-08-22 MED ORDER — 0.9 % SODIUM CHLORIDE (POUR BTL) OPTIME
TOPICAL | Status: DC | PRN
Start: 2023-08-22 — End: 2023-08-22
  Administered 2023-08-22: 1000 mL

## 2023-08-22 MED ORDER — LIDOCAINE HCL (PF) 2 % IJ SOLN
INTRAMUSCULAR | Status: AC
  Filled 2023-08-22: qty 5

## 2023-08-22 MED ORDER — CHLORHEXIDINE GLUCONATE CLOTH 2 % EX PADS: 6.0000 | MEDICATED_PAD | Freq: Every day | CUTANEOUS | Status: DC

## 2023-08-22 MED ORDER — LACTATED RINGERS IV SOLN: INTRAVENOUS | Status: DC

## 2023-08-22 MED ORDER — ATORVASTATIN CALCIUM 40 MG PO TABS
80.0000 mg | ORAL_TABLET | Freq: Every day | ORAL | Status: DC
  Administered 2023-08-22: 80 mg via ORAL
  Filled 2023-08-22: qty 2

## 2023-08-22 MED ORDER — OXYCODONE HCL 5 MG PO TABS: 5.0000 mg | ORAL_TABLET | Freq: Once | ORAL | Status: DC | PRN

## 2023-08-22 MED ORDER — OXYBUTYNIN CHLORIDE ER 5 MG PO TB24
10.0000 mg | ORAL_TABLET | Freq: Every day | ORAL | Status: DC
  Administered 2023-08-22: 10 mg via ORAL
  Filled 2023-08-22: qty 2

## 2023-08-22 MED ORDER — ACETAMINOPHEN 500 MG PO TABS
1000.0000 mg | ORAL_TABLET | Freq: Once | ORAL | Status: AC
  Administered 2023-08-22: 1000 mg via ORAL
  Filled 2023-08-22: qty 2

## 2023-08-22 MED ORDER — STERILE WATER FOR IRRIGATION IR SOLN
Status: DC | PRN
Start: 2023-08-22 — End: 2023-08-22
  Administered 2023-08-22: 500 mL

## 2023-08-22 MED ORDER — ONDANSETRON HCL 4 MG/2ML IJ SOLN: 4.0000 mg | Freq: Once | INTRAMUSCULAR | Status: DC | PRN

## 2023-08-22 MED ORDER — OXYCODONE HCL 5 MG/5ML PO SOLN: 5.0000 mg | Freq: Once | ORAL | Status: DC | PRN

## 2023-08-22 MED ORDER — AMLODIPINE BESYLATE 10 MG PO TABS
5.0000 mg | ORAL_TABLET | Freq: Every day | ORAL | Status: DC
  Administered 2023-08-22: 5 mg via ORAL
  Filled 2023-08-22: qty 1

## 2023-08-22 MED ORDER — PROPOFOL 10 MG/ML IV BOLUS
INTRAVENOUS | Status: DC | PRN
  Administered 2023-08-22: 130 mg via INTRAVENOUS
  Administered 2023-08-22: 10 mg via INTRAVENOUS

## 2023-08-22 MED ORDER — SUGAMMADEX SODIUM 200 MG/2ML IV SOLN
INTRAVENOUS | Status: DC | PRN
  Administered 2023-08-22: 200 mg via INTRAVENOUS

## 2023-08-22 MED ORDER — PROPOFOL 10 MG/ML IV BOLUS
INTRAVENOUS | Status: AC
  Filled 2023-08-22: qty 20

## 2023-08-22 MED ORDER — CHLORHEXIDINE GLUCONATE 0.12 % MT SOLN
15.0000 mL | Freq: Once | OROMUCOSAL | Status: AC
  Administered 2023-08-22: 15 mL via OROMUCOSAL

## 2023-08-22 MED ORDER — ACETAMINOPHEN 500 MG PO TABS: 500.0000 mg | ORAL_TABLET | Freq: Four times a day (QID) | ORAL | Status: DC | PRN

## 2023-08-22 MED ORDER — MIDAZOLAM HCL 2 MG/2ML IJ SOLN
INTRAMUSCULAR | Status: AC
  Filled 2023-08-22: qty 2

## 2023-08-22 MED ORDER — ONDANSETRON HCL 4 MG/2ML IJ SOLN
INTRAMUSCULAR | Status: DC | PRN
  Administered 2023-08-22: 4 mg via INTRAVENOUS

## 2023-08-22 MED ORDER — OXYCODONE HCL 5 MG PO TABS: 5.0000 mg | ORAL_TABLET | ORAL | Status: DC | PRN

## 2023-08-22 MED ORDER — ONDANSETRON HCL 4 MG/2ML IJ SOLN
INTRAMUSCULAR | Status: AC
  Filled 2023-08-22: qty 2

## 2023-08-22 MED ORDER — DEXAMETHASONE SODIUM PHOSPHATE 10 MG/ML IJ SOLN
INTRAMUSCULAR | Status: AC
  Filled 2023-08-22: qty 1

## 2023-08-22 MED ORDER — ROCURONIUM BROMIDE 10 MG/ML (PF) SYRINGE
PREFILLED_SYRINGE | INTRAVENOUS | Status: DC | PRN
  Administered 2023-08-22: 50 mg via INTRAVENOUS
  Administered 2023-08-22 (×2): 10 mg via INTRAVENOUS

## 2023-08-22 MED ORDER — SODIUM CHLORIDE 0.9 % IR SOLN
3000.0000 mL | Status: DC
  Administered 2023-08-22 (×2): 3000 mL

## 2023-08-22 SURGICAL SUPPLY — 21 items
BAG DRN RND TRDRP ANRFLXCHMBR (UROLOGICAL SUPPLIES)
BAG URINE DRAIN 2000ML AR STRL (UROLOGICAL SUPPLIES) IMPLANT
BAG URO CATCHER STRL LF (MISCELLANEOUS) ×1 IMPLANT
CATH FOLEY 3WAY 30CC 22FR (CATHETERS) IMPLANT
DRAPE FOOT SWITCH (DRAPES) ×1 IMPLANT
ELECT REM PT RETURN 15FT ADLT (MISCELLANEOUS) ×1 IMPLANT
EVACUATOR MICROVAS BLADDER (UROLOGICAL SUPPLIES) IMPLANT
GLOVE BIO SURGEON STRL SZ8.5 (GLOVE) ×1 IMPLANT
GOWN STRL REUS W/ TWL XL LVL3 (GOWN DISPOSABLE) ×1 IMPLANT
GOWN STRL REUS W/TWL XL LVL3 (GOWN DISPOSABLE) ×1
KIT TURNOVER KIT A (KITS) IMPLANT
LOOP CUT BIPOLAR 24F LRG (ELECTROSURGICAL) IMPLANT
MANIFOLD NEPTUNE II (INSTRUMENTS) ×1 IMPLANT
PACK CYSTO (CUSTOM PROCEDURE TRAY) ×1 IMPLANT
PAD PREP 24X48 CUFFED NSTRL (MISCELLANEOUS) ×1 IMPLANT
PLUG CATH AND CAP STRL 200 (CATHETERS) ×1 IMPLANT
SYR 30ML LL (SYRINGE) IMPLANT
SYR TOOMEY IRRIG 70ML (MISCELLANEOUS) ×2
SYRINGE TOOMEY IRRIG 70ML (MISCELLANEOUS) IMPLANT
TUBING CONNECTING 10 (TUBING) ×1 IMPLANT
TUBING UROLOGY SET (TUBING) ×1 IMPLANT

## 2023-08-22 NOTE — Anesthesia Preprocedure Evaluation (Addendum)
Anesthesia Evaluation  Patient identified by MRN, date of birth, ID band Patient awake    Reviewed: Allergy & Precautions, NPO status , Patient's Chart, lab work & pertinent test results, reviewed documented beta blocker date and time   History of Anesthesia Complications Negative for: history of anesthetic complications  Airway Mallampati: II  TM Distance: >3 FB Neck ROM: Full    Dental  (+) Dental Advisory Given   Pulmonary asthma    Pulmonary exam normal        Cardiovascular hypertension, Pt. on medications and Pt. on home beta blockers + CAD and + CABG  Normal cardiovascular exam   '24 TTE - EF 55 to 60%. Grade II diastolic dysfunction (pseudonormalization). Trivial mitral valve regurgitation. Aortic valve regurgitation is mild. Mild aortic valve stenosis. Aortic valve area, by VTI measures 1.58 cm. Aortic valve mean gradient measures 14.0 mmHg.     Neuro/Psych negative neurological ROS  negative psych ROS   GI/Hepatic negative GI ROS, Neg liver ROS,,,  Endo/Other  negative endocrine ROS    Renal/GU negative Renal ROS    Bladder mass     Musculoskeletal negative musculoskeletal ROS (+)    Abdominal   Peds  Hematology negative hematology ROS (+)   Anesthesia Other Findings   Reproductive/Obstetrics                             Anesthesia Physical Anesthesia Plan  ASA: 3  Anesthesia Plan: General   Post-op Pain Management: Tylenol PO (pre-op)*   Induction: Intravenous  PONV Risk Score and Plan: 2 and Treatment may vary due to age or medical condition, Ondansetron and Dexamethasone  Airway Management Planned: Oral ETT  Additional Equipment: None  Intra-op Plan:   Post-operative Plan: Extubation in OR  Informed Consent: I have reviewed the patients History and Physical, chart, labs and discussed the procedure including the risks, benefits and alternatives for the  proposed anesthesia with the patient or authorized representative who has indicated his/her understanding and acceptance.     Dental advisory given  Plan Discussed with: CRNA and Anesthesiologist  Anesthesia Plan Comments:         Anesthesia Quick Evaluation

## 2023-08-22 NOTE — Plan of Care (Signed)
  Problem: Education: Goal: Knowledge of the prescribed therapeutic regimen will improve Outcome: Progressing   Problem: Bowel/Gastric: Goal: Gastrointestinal status for postoperative course will improve Outcome: Progressing   Problem: Health Behavior/Discharge Planning: Goal: Identification of resources available to assist in meeting health care needs will improve Outcome: Progressing   Problem: Education: Goal: Knowledge of General Education information will improve Description: Including pain rating scale, medication(s)/side effects and non-pharmacologic comfort measures Outcome: Progressing   Problem: Health Behavior/Discharge Planning: Goal: Ability to manage health-related needs will improve Outcome: Progressing   Problem: Activity: Goal: Risk for activity intolerance will decrease Outcome: Progressing   Problem: Coping: Goal: Level of anxiety will decrease Outcome: Progressing   Problem: Pain Managment: Goal: General experience of comfort will improve Outcome: Progressing

## 2023-08-22 NOTE — Anesthesia Postprocedure Evaluation (Signed)
Anesthesia Post Note  Patient: Dominic Ray  Procedure(s) Performed: TRANSURETHRAL RESECTION OF BLADDER TUMOR (TURBT) (Bilateral)     Patient location during evaluation: PACU Anesthesia Type: General Level of consciousness: awake and alert Pain management: pain level controlled Vital Signs Assessment: post-procedure vital signs reviewed and stable Respiratory status: spontaneous breathing, nonlabored ventilation and respiratory function stable Cardiovascular status: stable and blood pressure returned to baseline Anesthetic complications: no     Last Vitals:  Vitals:   08/22/23 1415 08/22/23 1430  BP: (!) 151/74 (!) 158/72  Pulse: 65 70  Resp: 11 11  Temp:    SpO2: 96% 97%    Last Pain:  Vitals:   08/22/23 1430  TempSrc:   PainSc: 0-No pain                 Beryle Lathe

## 2023-08-22 NOTE — Plan of Care (Signed)
  Problem: Education: Goal: Knowledge of the prescribed therapeutic regimen will improve Outcome: Progressing   Problem: Skin Integrity: Goal: Demonstration of wound healing without infection will improve Outcome: Progressing   Problem: Education: Goal: Knowledge of General Education information will improve Description: Including pain rating scale, medication(s)/side effects and non-pharmacologic comfort measures Outcome: Progressing   Problem: Coping: Goal: Level of anxiety will decrease Outcome: Progressing   Problem: Elimination: Goal: Will not experience complications related to urinary retention Outcome: Progressing   Problem: Pain Managment: Goal: General experience of comfort will improve Outcome: Progressing   Problem: Safety: Goal: Ability to remain free from injury will improve Outcome: Progressing   Problem: Skin Integrity: Goal: Risk for impaired skin integrity will decrease Outcome: Progressing

## 2023-08-22 NOTE — Interval H&P Note (Signed)
History and Physical Interval Note:  08/22/2023 11:04 AM  Dominic Ray  has presented today for surgery, with the diagnosis of bladder mass.  The various methods of treatment have been discussed with the patient and family. After consideration of risks, benefits and other options for treatment, the patient has consented to  Procedure(s): TRANSURETHRAL RESECTION OF BLADDER TUMOR (TURBT) (Bilateral) as a surgical intervention.  The patient's history has been reviewed, patient examined, no change in status, stable for surgery.  I have reviewed the patient's chart and labs.  Questions were answered to the patient's satisfaction.     [name]

## 2023-08-22 NOTE — Op Note (Signed)
OPERATIVE NOTE   Patient Name: Dominic Ray  MRN: [id number]   Date of Procedure: 08/22/2023   Preoperative diagnosis:  Bladder cancer  Postoperative diagnosis:  same  Procedure:  TURBT- LARGE BLADDER TUMOR- > 5CM RESECTION; MULTIFOCAL.  CIRCUMFERENTIAL AT BLADDER NECK, TRIGONE AND RIGHT LATERAL WALL. Examination under anesthesia--50 to 60 g benign feeling prostate.  Marked bladder thickening noted distally towards bladder neck without obvious 3-dimensional mass  Attending: Concepcion Living, MD  Anesthesia: GETA  Estimated blood loss: MINIMAL  Fluids: Per anesthesia record  Drains: 22 Fr 3 way Foley  Specimens: turbt chips  Antibiotics: rocephin  Findings: Residual gross tumor present involving the circumferential bladder neck region somewhat obscured by intravesical extension of prostate which required some resection to access the tumor.  Indications:  High-grade bladder cancer  Description of Procedure:  The patient was brought to the operating room where he was correctly identified and underwent satisfactory induction of general endotracheal anesthesia.  He was then positioned in the modified dorsolithotomy position and prepped and draped in the usual sterile fashion.  Cystoscopy was subsequently performed with a 22 [place] panendoscope.  The anterior urethra appeared normal.  The prostatic urethra reveals marked lateral lobe enlargement with friable prostatic urothelium as well as a fairly extensive intravesical lobe extending into the bladder circumferentially.  Bladder was subsequently entered and carefully inspected.  There was noted to be gross residual tumor obviously present near the bladder neck especially on the right side and anteriorly.  This was largely obscured by the intravesical prostate lobe.  There was also some residual tumor distally on the right lateral wall.  The urethra was subsequently dilated to 66 [place] with successive [employer].  I then  placed the continuous-flow resectoscope bipolar apparatus under direct vision.  All of the prior resection sites were subsequently resected to make sure adequate muscularis propria was obtained.  Hemostasis was obtained continuously with the electrocautery.  As noted the intravesical lobe of the prostate obscured obvious gross tumor especially anteriorly and I did resect some of the prostate to access the tumor which was subsequently resected with a cutting current and the fulguration current for hemostasis.  It should be noted that I could not identify the right ureteral orifice.  The left ureteral orifice was identified after being transected.  It could be seen effluxing clear urine.  After all of the gross tumor was resected and hemostasis was obtained the specimen was removed from the bladder with an Immunologist.  Hemostasis was again verified and a 22 [place] three-way Foley catheter was placed without difficulty with clear return.  The plan was to send the patient to the recovery room with continuous bladder irrigation and assess him in an hour or 2 to decide whether or not he could go home without irrigation or whether he would need to be admitted for observation.  The patient tolerated the procedure well.  No apparent complications.  Complications: None  Condition: Stable, extubated, transferred to PACU  Plan: FU 2 weeks with path analysis

## 2023-08-22 NOTE — Anesthesia Procedure Notes (Signed)
Procedure Name: Intubation Date/Time: 08/22/2023 11:17 AM  Performed by: Florene Route, CRNAPatient Re-evaluated:Patient Re-evaluated prior to induction Oxygen Delivery Method: Circle system utilized Preoxygenation: Pre-oxygenation with 100% oxygen Induction Type: IV induction Ventilation: Mask ventilation without difficulty and Oral airway inserted - appropriate to patient size Laryngoscope Size: Glidescope and 3 Grade View: Grade I Tube type: Parker flex tip Tube size: 7.5 mm Number of attempts: 1 Airway Equipment and Method: Rigid stylet and Video-laryngoscopy Placement Confirmation: ETT inserted through vocal cords under direct vision, positive ETCO2 and breath sounds checked- equal and bilateral Secured at: 22 cm Tube secured with: Tape Dental Injury: Teeth and Oropharynx as per pre-operative assessment  Difficulty Due To: Difficulty was anticipated and Difficult Airway- due to anterior larynx

## 2023-08-22 NOTE — Transfer of Care (Signed)
Immediate Anesthesia Transfer of Care Note  Patient: Dominic Ray  Procedure(s) Performed: TRANSURETHRAL RESECTION OF BLADDER TUMOR (TURBT) (Bilateral)  Patient Location: PACU  Anesthesia Type:General  Level of Consciousness: drowsy  Airway & Oxygen Therapy: Patient Spontanous Breathing and Patient connected to face mask oxygen  Post-op Assessment: Report given to RN and Post -op Vital signs reviewed and stable  Post vital signs: Reviewed and stable  Last Vitals:  Vitals Value Taken Time  BP    Temp    Pulse 54 08/22/23 1257  Resp 11 08/22/23 1257  SpO2 100 % 08/22/23 1257  Vitals shown include unfiled device data.  Last Pain:  Vitals:   08/22/23 0902  TempSrc: Oral         Complications:  Encounter Notable Events  Notable Event Outcome Phase Comment  Difficult to intubate - expected  Intraprocedure Filed from anesthesia note documentation.

## 2023-08-23 ENCOUNTER — Encounter (HOSPITAL_COMMUNITY): Payer: Self-pay | Admitting: Urology

## 2023-08-23 DIAGNOSIS — C679 Malignant neoplasm of bladder, unspecified: Secondary | ICD-10-CM | POA: Diagnosis not present

## 2023-08-23 LAB — SURGICAL PATHOLOGY

## 2023-08-23 NOTE — Progress Notes (Signed)
Urology Inpatient Progress Note  Subjective: Patient feels well this am.  Urine clear on minimal cbi.    Anti-infectives: Anti-infectives (From admission, onward)    Start     Dose/Rate Route Frequency Ordered Stop   08/22/23 0915  cefTRIAXone (ROCEPHIN) 1 g in sodium chloride 0.9 % 100 mL IVPB        1 g 200 mL/hr over 30 Minutes Intravenous 30 min pre-op 08/22/23 0853 08/22/23 1134       Current Facility-Administered Medications  Medication Dose Route Frequency Provider Last Rate Last Admin   acetaminophen (TYLENOL) tablet 500 mg  500 mg Oral Q6H PRN [name], MD       amLODipine (NORVASC) tablet 5 mg  5 mg Oral Daily [name], MD   5 mg at 08/22/23 2057   atorvastatin (LIPITOR) tablet 80 mg  80 mg Oral Daily [name], MD   80 mg at 08/22/23 2055   Chlorhexidine Gluconate Cloth 2 % PADS 6 each  6 each Topical Daily [name], MD       metoprolol succinate (TOPROL-XL) 24 hr tablet 12.5 mg  12.5 mg Oral Daily [name], MD   12.5 mg at 08/22/23 2057   oxybutynin (DITROPAN-XL) 24 hr tablet 10 mg  10 mg Oral Daily [name], MD   10 mg at 08/22/23 2056   oxyCODONE (Oxy IR/ROXICODONE) immediate release tablet 5 mg  5 mg Oral Q4H PRN [name], MD       sodium chloride irrigation 0.9 % 3,000 mL  3,000 mL Irrigation Continuous [name], MD   3,000 mL at 08/22/23 1842   tamsulosin (FLOMAX) capsule 0.4 mg  0.4 mg Oral Daily [name], MD   0.4 mg at 08/22/23 2056     Objective: Vital signs in last 24 hours: Temp:  [97.5 F (36.4 C)-98.5 F (36.9 C)] 97.9 F (36.6 C) (10/09 0651) Pulse Rate:  [59-99] 70 (10/09 0651) Resp:  [11-20] 18 (10/09 0651) BP: (129-168)/(55-94) 144/82 (10/09 0651) SpO2:  [92 %-100 %] 96 % (10/09 0651) Weight:  [89.4 kg] 89.4 kg (10/08 1832)  Intake/Output from previous day: 10/08 0701 - 10/09 0700 In: 78295.6 [I.V.:1493.5; IV Piggyback:200] Out: 21308 [Urine:15300] Intake/Output this  shift: No intake/output data recorded.  GENERAL APPEARANCE:  Well appearing, well developed, well nourished, NAD HEENT:  Atraumatic, normocephalic, oropharynx clear  GU:  Foley in place.  Clear output   Lab Results:  No results for input(s): "WBC", "HGB", "HCT", "PLT" in the last 72 hours. BMET No results for input(s): "NA", "K", "CL", "CO2", "GLUCOSE", "BUN", "CREATININE", "CALCIUM" in the last 72 hours. PT/INR No results for input(s): "LABPROT", "INR" in the last 72 hours. ABG No results for input(s): "PHART", "HCO3" in the last 72 hours.  Invalid input(s): "PCO2", "PO2"  Studies/Results: No results found.   Assessment & Plan: POD 1 s/p turbt.  Doing well DC home with Foley Will FU next week for Foley removal and path review   [name], MD 08/23/2023

## 2023-08-23 NOTE — Care Management Obs Status (Signed)
MEDICARE OBSERVATION STATUS NOTIFICATION   Patient Details  Name: Dominic Ray MRN: [id number] Date of Birth: 02-17-1956   Medicare Observation Status Notification Given:  Yes    Larrie Kass, LCSW 08/23/2023, 9:11 AM

## 2023-08-23 NOTE — Discharge Summary (Signed)
Patient ID: Dominic Ray MRN: [id number] DOB/AGE: 1956-01-04 67 y.o.  Admit date: 08/22/2023 Discharge date: 08/23/2023  Primary Care Physician:  Monica Becton, MD  Discharge Diagnoses:   Present on Admission:  Bladder cancer [facility])   Consults:  none   Discharge Medications: Allergies as of 08/23/2023       Reactions   [name] [lidocaine] Rash   Anything ending in (-caine) per patient         Medication List     TAKE these medications    acetaminophen 500 MG tablet Commonly known as: TYLENOL Take 500 mg by mouth every 6 (six) hours as needed for moderate pain.   amLODipine 5 MG tablet Commonly known as: NORVASC TAKE 1 TABLET (5 MG TOTAL) BY MOUTH DAILY.   aspirin EC 81 MG tablet Take 1 tablet (81 mg total) by mouth daily.   atorvastatin 80 MG tablet Commonly known as: LIPITOR TAKE 1 TABLET BY MOUTH EVERY DAY   metoprolol succinate 25 MG 24 hr tablet Commonly known as: TOPROL-XL Take 0.5 tablets (12.5 mg total) by mouth daily.   tamsulosin 0.4 MG Caps capsule Commonly known as: FLOMAX Take 1 capsule (0.4 mg total) by mouth daily.         Significant Diagnostic Studies:  No results found.  Brief H and P: For complete details please refer to admission H and P, but in brief patient has a history of high-grade T1 bladder cancer status post recent TURBT.  He is currently admitted for repeat TURBT for more accurate staging purposes.  Hospital Course:  Principal Problem:   Bladder cancer (HCC)   Day of Discharge BP (!) 144/82 (BP Location: Right Arm)   Pulse 70   Temp 97.9 F (36.6 C) (Oral)   Resp 18   Ht 5\' 8"  (1.727 m)   Wt 89.4 kg   SpO2 96%   BMI 29.97 kg/m   No results found for this or any previous visit (from the past 24 hour(s)).  Physical Exam: General: Alert and awake oriented x3 not in any acute distress. HEENT: anicteric sclera, pupils reactive to light and accommodation CVS: S1-S2 clear no murmur rubs or gallops Chest:  clear to auscultation bilaterally, no wheezing rales or rhonchi Abdomen: soft nontender, nondistended, normal bowel sounds, no organomegaly Extremities: no cyanosis, clubbing or edema noted bilaterally Neuro: Cranial nerves II-XII intact, no focal neurological deficits  Disposition:  Home  Diet:  preadmission  Activity: No heavy lifting or straining for 2 weeks   TESTS THAT NEED FOLLOW-UP  Pathology  DISCHARGE FOLLOW-UP-next week    Time spent on Discharge:   32 min  Signed: [name] 08/23/2023, 7:38 AM

## 2023-08-29 ENCOUNTER — Encounter: Payer: Medicare HMO | Admitting: Urology

## 2023-08-31 ENCOUNTER — Encounter: Payer: Self-pay | Admitting: Urology

## 2023-08-31 ENCOUNTER — Ambulatory Visit (INDEPENDENT_AMBULATORY_CARE_PROVIDER_SITE_OTHER): Payer: Medicare HMO | Admitting: Urology

## 2023-08-31 VITALS — BP 136/71 | HR 76

## 2023-08-31 DIAGNOSIS — C678 Malignant neoplasm of overlapping sites of bladder: Secondary | ICD-10-CM | POA: Diagnosis not present

## 2023-08-31 DIAGNOSIS — N401 Enlarged prostate with lower urinary tract symptoms: Secondary | ICD-10-CM

## 2023-08-31 MED ORDER — FINASTERIDE 5 MG PO TABS
5.0000 mg | ORAL_TABLET | Freq: Every day | ORAL | 3 refills | Status: DC
Start: 2023-08-31 — End: 2024-09-03

## 2023-08-31 NOTE — Addendum Note (Signed)
Addended by: Carolin Coy on: 08/31/2023 12:03 PM   Modules accepted: Orders

## 2023-08-31 NOTE — Progress Notes (Addendum)
Assessment: 1. Malignant neoplasm of overlapping sites of bladder (HCC)   2. Benign localized prostatic hyperplasia with lower urinary tract symptoms (LUTS)     Plan: Urine for culture today Hold treatment pending culture results   Today I had a long discussion with the patient and family regarding his BPH and bladder cancer. Concerning his BPH I have recommended to begin combination medical therapy. Patient will continue tamsulosin Will add finasteride 5 mg daily-prescription provided Ashby Dawes of medication including potential adverse events and side effects reviewed.  Concerning his high risk nonmuscle invasive bladder cancer we discussed options for management including adjuvant immunotherapy as well as consideration for cystectomy.  Patient is clear that he prefers to try and preserve his bladder. Given this I have recommended to begin with induction BCG and also given his high risk disease we will recommend ongoing standard maintenance therapy.  BCG therapy was discussed in detail today including potential adverse events or complications.  He expresses understanding and agrees to proceed. Follow-up 3 weeks to initiate BCG therapy  Will obtain renal ultrasound prior to visit.  At the time of his repeat TURBT I could not clearly identify his ureteral opening.   Chief Complaint: Bladder cancer  HPI: Dominic Ray is a 67 y.o. male who presents for continued evaluation of bladder cancer. He is accompanied today by his wife and sister. Patient reports doing well status post recent repeat TURBT. Patient also has significant BPH and lower urinary tract symptoms.  Bladder cancer summary: Status post TURBT 07/18/2023-pathology high-grade T1-muscularis propria present but uninvolved Status post repeat TURBT 08/22/2023-pathology high-grade TA/question T1.  Muscularis propria present but uninvolved  CT hematuria profile 06/20/2023-bladder wall thickening concerning for bladder cancer without  evidence of metastatic disease or upper tract abnormalities.  Portions of the above documentation were copied from a prior visit for review purposes only.  Allergies: Allergies  Allergen Reactions   [name] [Lidocaine] Rash    Anything ending in (-caine) per patient     PMH: Past Medical History:  Diagnosis Date   Asthma    AS A CHILD   Basal cell carcinoma of nasal tip 08/24/2012   Coronary artery disease    a. Abnl stress test -> s/p CABGx4 in 04/2017 with LIMA-LAD, SVG-diag, SVG-AM, SVG-PDA.   Heart murmur    Hyperlipidemia    Hypertension    Male hypogonadism 08/24/2012   Onychomycosis 08/24/2012   Pneumonia     PSH: Past Surgical History:  Procedure Laterality Date   CARDIAC CATHETERIZATION  05/08/2017   CARDIOVASCULAR STRESS TEST  04/2017   CORONARY ARTERY BYPASS GRAFT N/A 05/09/2017   Procedure: CORONARY ARTERY BYPASS GRAFTING times four using left internal mammary artery and right leg saphenous vein  ;  Surgeon: Alleen Borne, MD;  Location: [facility] OR;  Service: Open Heart Surgery;  Laterality: N/A;   LEFT HEART CATH AND CORONARY ANGIOGRAPHY N/A 05/08/2017   Procedure: Left Heart Cath and Coronary Angiography;  Surgeon: Lennette Bihari, MD;  Location: [facility] INVASIVE CV LAB;  Service: Cardiovascular;  Laterality: N/A;   TEE WITHOUT CARDIOVERSION N/A 05/09/2017   Procedure: TRANSESOPHAGEAL ECHOCARDIOGRAM (TEE);  Surgeon: Alleen Borne, MD;  Location: [facility] OR;  Service: Open Heart Surgery;  Laterality: N/A;   TONSILLECTOMY  1968   TRANSURETHRAL RESECTION OF BLADDER TUMOR Bilateral 07/18/2023   Procedure: TRANSURETHRAL RESECTION OF BLADDER TUMOR (TURBT);  Surgeon: [name], MD;  Location: [facility] ORS;  Service: Urology;  Laterality: Bilateral;   TRANSURETHRAL RESECTION OF BLADDER  TUMOR Bilateral 08/22/2023   Procedure: TRANSURETHRAL RESECTION OF BLADDER TUMOR (TURBT);  Surgeon: [name], MD;  Location: [facility] ORS;  Service: Urology;  Laterality: Bilateral;    SH: Social  History   Tobacco Use   Smoking status: Never   Smokeless tobacco: Never  Vaping Use   Vaping status: Never Used  Substance Use Topics   Alcohol use: Yes    Comment: Occasional   Drug use: No    ROS: Constitutional:  Negative for fever, chills, weight loss CV: Negative for chest pain, previous MI, hypertension Respiratory:  Negative for shortness of breath, wheezing, sleep apnea, frequent cough GI:  Negative for nausea, vomiting, bloody stool, GERD  PE: BP 136/71   Pulse 76  GENERAL APPEARANCE:  Well appearing, well developed, well nourished, NAD  Results: Urinalysis demonstrates significant microscopic hematuria pyuria as well as some bacteriuria will send culture

## 2023-08-31 NOTE — Patient Instructions (Signed)

## 2023-09-01 LAB — URINE CULTURE: Organism ID, Bacteria: NO GROWTH

## 2023-09-06 LAB — URINALYSIS, ROUTINE W REFLEX MICROSCOPIC
Bilirubin, UA: NEGATIVE
Glucose, UA: NEGATIVE
Ketones, UA: NEGATIVE
Nitrite, UA: NEGATIVE
Specific Gravity, UA: >1.03 — ABNORMAL HIGH (ref 1.005–1.030)
Urobilinogen, Ur: 0.2 mg/dL (ref 0.2–1.0)
pH, UA: 6 (ref 5.0–7.5)

## 2023-09-06 LAB — MICROSCOPIC EXAMINATION: RBC, Urine: >30 /[HPF] — AB (ref 0–2)

## 2023-09-14 ENCOUNTER — Encounter: Payer: Self-pay | Admitting: Urology

## 2023-09-18 ENCOUNTER — Other Ambulatory Visit: Payer: Self-pay | Admitting: Urology

## 2023-09-18 DIAGNOSIS — C678 Malignant neoplasm of overlapping sites of bladder: Secondary | ICD-10-CM

## 2023-09-21 ENCOUNTER — Ambulatory Visit: Payer: Medicare HMO | Admitting: Urology

## 2023-09-21 ENCOUNTER — Encounter: Payer: Self-pay | Admitting: Urology

## 2023-09-21 DIAGNOSIS — C678 Malignant neoplasm of overlapping sites of bladder: Secondary | ICD-10-CM | POA: Diagnosis not present

## 2023-09-21 DIAGNOSIS — N401 Enlarged prostate with lower urinary tract symptoms: Secondary | ICD-10-CM

## 2023-09-21 NOTE — Progress Notes (Signed)
Assessment: 1. Malignant neoplasm of overlapping sites of bladder (HCC)   2. Benign localized prostatic hyperplasia with lower urinary tract symptoms (LUTS)     Plan: Today I had a long discussion with the patient.  He had a list with numerous questions regarding BCG treatment.  We went through his list and answered all his questions.  Given marked pyuria and hematuria will not start BCG yet. Urine for culture today Discussed with patient that his bladder needs more time to heal. Patient will return in 1 to 2 weeks for recheck and to initiate BCG induction.  Renal ultrasound scheduled for 09/29/2023  Chief Complaint: Bladder cancer  HPI: Dominic Ray is a 67 y.o. male who presents for continued evaluation of high risk nonmuscle invasive bladder cancer.  Patient also has BPH with significant lower urinary tract symptoms now on combination medical therapy with tamsulosin and finasteride.  Renal ultrasound has been scheduled for 09/29/2023.  Bladder cancer summary: Status post TURBT 07/18/2023-pathology high-grade T1-muscularis propria present but uninvolved Status post repeat TURBT 08/22/2023-pathology high-grade TA/question T1.  Muscularis propria present but uninvolved   CT hematuria profile 06/20/2023-bladder wall thickening concerning for bladder cancer without evidence of metastatic disease or upper tract abnormalities.   Portions of the above documentation were copied from a prior visit for review purposes only.  Allergies: Allergies  Allergen Reactions   [name] [Lidocaine] Rash    Anything ending in (-caine) per patient     PMH: Past Medical History:  Diagnosis Date   Asthma    AS A CHILD   Basal cell carcinoma of nasal tip 08/24/2012   Coronary artery disease    a. Abnl stress test -> s/p CABGx4 in 04/2017 with LIMA-LAD, SVG-diag, SVG-AM, SVG-PDA.   Heart murmur    Hyperlipidemia    Hypertension    Male hypogonadism 08/24/2012   Onychomycosis 08/24/2012   Pneumonia      PSH: Past Surgical History:  Procedure Laterality Date   CARDIAC CATHETERIZATION  05/08/2017   CARDIOVASCULAR STRESS TEST  04/2017   CORONARY ARTERY BYPASS GRAFT N/A 05/09/2017   Procedure: CORONARY ARTERY BYPASS GRAFTING times four using left internal mammary artery and right leg saphenous vein  ;  Surgeon: Alleen Borne, MD;  Location: [facility] OR;  Service: Open Heart Surgery;  Laterality: N/A;   LEFT HEART CATH AND CORONARY ANGIOGRAPHY N/A 05/08/2017   Procedure: Left Heart Cath and Coronary Angiography;  Surgeon: Lennette Bihari, MD;  Location: [facility] INVASIVE CV LAB;  Service: Cardiovascular;  Laterality: N/A;   TEE WITHOUT CARDIOVERSION N/A 05/09/2017   Procedure: TRANSESOPHAGEAL ECHOCARDIOGRAM (TEE);  Surgeon: Alleen Borne, MD;  Location: [facility] OR;  Service: Open Heart Surgery;  Laterality: N/A;   TONSILLECTOMY  1968   TRANSURETHRAL RESECTION OF BLADDER TUMOR Bilateral 07/18/2023   Procedure: TRANSURETHRAL RESECTION OF BLADDER TUMOR (TURBT);  Surgeon: [name], MD;  Location: [facility] ORS;  Service: Urology;  Laterality: Bilateral;   TRANSURETHRAL RESECTION OF BLADDER TUMOR Bilateral 08/22/2023   Procedure: TRANSURETHRAL RESECTION OF BLADDER TUMOR (TURBT);  Surgeon: [name], MD;  Location: [facility] ORS;  Service: Urology;  Laterality: Bilateral;    SH: Social History   Tobacco Use   Smoking status: Never   Smokeless tobacco: Never  Vaping Use   Vaping status: Never Used  Substance Use Topics   Alcohol use: Yes    Comment: Occasional   Drug use: No    ROS: Constitutional:  Negative for fever, chills, weight loss CV: Negative for  chest pain, previous MI, hypertension Respiratory:  Negative for shortness of breath, wheezing, sleep apnea, frequent cough GI:  Negative for nausea, vomiting, bloody stool, GERD  PE: GENERAL APPEARANCE:  Well appearing, well developed, well nourished, NAD    Results: Urinalysis shows significant pyuria as well as microscopic hematuria and  moderate bacteria.  Will send urine for culture

## 2023-09-23 LAB — URINE CULTURE

## 2023-09-25 LAB — MICROSCOPIC EXAMINATION
RBC, Urine: >30 /[HPF] — AB (ref 0–2)
WBC, UA: >30 /[HPF] — AB (ref 0–5)

## 2023-09-25 LAB — URINALYSIS, ROUTINE W REFLEX MICROSCOPIC
Bilirubin, UA: NEGATIVE
Glucose, UA: NEGATIVE
Ketones, UA: NEGATIVE
Nitrite, UA: NEGATIVE
Specific Gravity, UA: >1.03 — ABNORMAL HIGH (ref 1.005–1.030)
Urobilinogen, Ur: 0.2 mg/dL (ref 0.2–1.0)
pH, UA: 6 (ref 5.0–7.5)

## 2023-09-27 ENCOUNTER — Other Ambulatory Visit (HOSPITAL_BASED_OUTPATIENT_CLINIC_OR_DEPARTMENT_OTHER): Payer: Medicare HMO

## 2023-09-28 ENCOUNTER — Ambulatory Visit: Payer: Medicare HMO | Admitting: Urology

## 2023-09-29 ENCOUNTER — Ambulatory Visit (HOSPITAL_BASED_OUTPATIENT_CLINIC_OR_DEPARTMENT_OTHER)
Admission: RE | Admit: 2023-09-29 | Discharge: 2023-09-29 | Disposition: A | Discharge status: Home / Self Care | Payer: Medicare HMO | Source: Ambulatory Visit | Attending: Urology | Admitting: Urology

## 2023-09-29 DIAGNOSIS — N400 Enlarged prostate without lower urinary tract symptoms: Secondary | ICD-10-CM | POA: Insufficient documentation

## 2023-09-29 DIAGNOSIS — C678 Malignant neoplasm of overlapping sites of bladder: Secondary | ICD-10-CM | POA: Insufficient documentation

## 2023-09-29 DIAGNOSIS — N1339 Other hydronephrosis: Secondary | ICD-10-CM | POA: Insufficient documentation

## 2023-09-29 DIAGNOSIS — C679 Malignant neoplasm of bladder, unspecified: Secondary | ICD-10-CM | POA: Diagnosis not present

## 2023-09-29 DIAGNOSIS — N1330 Unspecified hydronephrosis: Secondary | ICD-10-CM | POA: Diagnosis not present

## 2023-10-01 ENCOUNTER — Other Ambulatory Visit: Payer: Self-pay | Admitting: Urology

## 2023-10-01 DIAGNOSIS — C678 Malignant neoplasm of overlapping sites of bladder: Secondary | ICD-10-CM

## 2023-10-02 ENCOUNTER — Other Ambulatory Visit: Payer: Medicare HMO

## 2023-10-02 ENCOUNTER — Telehealth: Payer: Self-pay | Admitting: Urology

## 2023-10-02 DIAGNOSIS — C678 Malignant neoplasm of overlapping sites of bladder: Secondary | ICD-10-CM

## 2023-10-02 NOTE — Telephone Encounter (Signed)
Returning your call. °

## 2023-10-03 ENCOUNTER — Ambulatory Visit: Payer: Medicare HMO | Admitting: Urology

## 2023-10-03 ENCOUNTER — Encounter: Payer: Self-pay | Admitting: Urology

## 2023-10-03 DIAGNOSIS — N401 Enlarged prostate with lower urinary tract symptoms: Secondary | ICD-10-CM

## 2023-10-03 DIAGNOSIS — C678 Malignant neoplasm of overlapping sites of bladder: Secondary | ICD-10-CM | POA: Diagnosis not present

## 2023-10-03 LAB — BASIC METABOLIC PANEL
BUN/Creatinine Ratio: 19 (ref 10–24)
BUN: 18 mg/dL (ref 8–27)
CO2: 26 mmol/L (ref 20–29)
Calcium: 9.1 mg/dL (ref 8.6–10.2)
Chloride: 103 mmol/L (ref 96–106)
Creatinine, Ser: 0.94 mg/dL (ref 0.76–1.27)
Glucose: 112 mg/dL — ABNORMAL HIGH (ref 70–99)
Potassium: 4.7 mmol/L (ref 3.5–5.2)
Sodium: 141 mmol/L (ref 134–144)
eGFR: 89 mL/min/{1.73_m2} (ref >59)

## 2023-10-03 NOTE — Progress Notes (Signed)
Assessment: 1. Malignant neoplasm of overlapping sites of bladder (HCC)   2. Benign localized prostatic hyperplasia with lower urinary tract symptoms (LUTS)     Plan: Continue tamsulosin and encouraged patient to start taking finasteride for bph.  Given significant continued pyuria and microscopic hematuria I do not think it would be prudent to start BCG yet.  I reviewed his renal ultrasound with him and his sister today which does show some mild maybe moderate bilateral hydro.  There is also markedly thickened bladder wall noted on ultrasound. Renal function has remained normal and at baseline. Discussed implications and concerns in great detail today and answered numerous questions.  Will proceed with-- CT- heme protocol FU 2 weeks Repeat bmp prior to visit  Chief Complaint: Bladder cancer  HPI: Dominic Ray is a 67 y.o. male who presents for continued evaluation of bladder cancer. Has noticed significant improvement in LUTS.  Has not started finasteride. No GH Urine continues to show marked pyuria and microscopic hematuria indicating incomplete bladder healing.  See my note 06/14/2023 at time of initial visit for detailed history. Presented with worsening LUTS and gross hematuria.  PSA 06/2023=  3.1 Prostate volume (US)= 71ml  Heme eval-- Office cysto demonstrated bladder tumor CT hematuria profile 06/20/2023-bladder wall thickening concerning for bladder cancer without evidence of metastatic disease or upper tract abnormalities.  Bladder cancer summary: Status post TURBT 07/18/2023-pathology high-grade T1-muscularis propria present but uninvolved--large area of tumor involvement trigone, right floor/lateral wall and around bladder neck. RUO not identified.  Left UO seen  Status post repeat TURBT 08/22/2023-pathology high-grade TA/question focal T1.  Muscularis propria present but uninvolved RUO again not visualized.  Left UO transected and wide open with clear efflux   Renal US  09/2023-- mild-mod bilateral hydro and marked bladder wall thickening Lab:  09/2023-- CR 0.94   Portions of the above documentation were copied from a prior visit for review purposes only.  Allergies: Allergies  Allergen Reactions   [name] [Lidocaine] Rash    Anything ending in (-caine) per patient     PMH: Past Medical History:  Diagnosis Date   Asthma    AS A CHILD   Basal cell carcinoma of nasal tip 08/24/2012   Coronary artery disease    a. Abnl stress test -> s/p CABGx4 in 04/2017 with LIMA-LAD, SVG-diag, SVG-AM, SVG-PDA.   Heart murmur    Hyperlipidemia    Hypertension    Male hypogonadism 08/24/2012   Onychomycosis 08/24/2012   Pneumonia     PSH: Past Surgical History:  Procedure Laterality Date   CARDIAC CATHETERIZATION  05/08/2017   CARDIOVASCULAR STRESS TEST  04/2017   CORONARY ARTERY BYPASS GRAFT N/A 05/09/2017   Procedure: CORONARY ARTERY BYPASS GRAFTING times four using left internal mammary artery and right leg saphenous vein  ;  Surgeon: Alleen Borne, MD;  Location: [facility] OR;  Service: Open Heart Surgery;  Laterality: N/A;   LEFT HEART CATH AND CORONARY ANGIOGRAPHY N/A 05/08/2017   Procedure: Left Heart Cath and Coronary Angiography;  Surgeon: Lennette Bihari, MD;  Location: [facility] INVASIVE CV LAB;  Service: Cardiovascular;  Laterality: N/A;   TEE WITHOUT CARDIOVERSION N/A 05/09/2017   Procedure: TRANSESOPHAGEAL ECHOCARDIOGRAM (TEE);  Surgeon: Alleen Borne, MD;  Location: [facility] OR;  Service: Open Heart Surgery;  Laterality: N/A;   TONSILLECTOMY  1968   TRANSURETHRAL RESECTION OF BLADDER TUMOR Bilateral 07/18/2023   Procedure: TRANSURETHRAL RESECTION OF BLADDER TUMOR (TURBT);  Surgeon: [name], MD;  Location: [facility] ORS;  Service: Urology;  Laterality: Bilateral;   TRANSURETHRAL RESECTION OF BLADDER TUMOR Bilateral 08/22/2023   Procedure: TRANSURETHRAL RESECTION OF BLADDER TUMOR (TURBT);  Surgeon: [name], MD;  Location: [facility] ORS;  Service: Urology;   Laterality: Bilateral;    SH: Social History   Tobacco Use   Smoking status: Never   Smokeless tobacco: Never  Vaping Use   Vaping status: Never Used  Substance Use Topics   Alcohol use: Yes    Comment: Occasional   Drug use: No    ROS: Constitutional:  Negative for fever, chills, weight loss CV: Negative for chest pain, previous MI, hypertension Respiratory:  Negative for shortness of breath, wheezing, sleep apnea, frequent cough GI:  Negative for nausea, vomiting, bloody stool, GERD  PE: GENERAL APPEARANCE:  Well appearing, well developed, well nourished, NAD    Results: Results for orders placed or performed in visit on 10/02/23 (from the past 24 hour(s))  Basic metabolic panel   Collection Time: 10/02/23  3:55 PM  Result Value Ref Range   Glucose 112 (H) 70 - 99 mg/dL   BUN 18 8 - 27 mg/dL   Creatinine, Ser 1.61 0.76 - 1.27 mg/dL   eGFR 89 >09 UE/AVW/0.98   BUN/Creatinine Ratio 19 10 - 24   Sodium 141 134 - 144 mmol/L   Potassium 4.7 3.5 - 5.2 mmol/L   Chloride 103 96 - 106 mmol/L   CO2 26 20 - 29 mmol/L   Calcium 9.1 8.6 - 10.2 mg/dL

## 2023-10-04 LAB — URINALYSIS, ROUTINE W REFLEX MICROSCOPIC
Bilirubin, UA: NEGATIVE
Glucose, UA: NEGATIVE
Ketones, UA: NEGATIVE
Nitrite, UA: NEGATIVE
Specific Gravity, UA: 1.025 (ref 1.005–1.030)
Urobilinogen, Ur: 0.2 mg/dL (ref 0.2–1.0)
pH, UA: 6.5 (ref 5.0–7.5)

## 2023-10-04 LAB — MICROSCOPIC EXAMINATION
RBC, Urine: >30 /[HPF] — AB (ref 0–2)
WBC, UA: >30 /[HPF] — AB (ref 0–5)

## 2023-10-05 ENCOUNTER — Ambulatory Visit: Payer: Medicare HMO | Admitting: Urology

## 2023-10-07 ENCOUNTER — Encounter (HOSPITAL_BASED_OUTPATIENT_CLINIC_OR_DEPARTMENT_OTHER): Payer: Self-pay | Admitting: *Deleted

## 2023-10-07 ENCOUNTER — Ambulatory Visit (HOSPITAL_BASED_OUTPATIENT_CLINIC_OR_DEPARTMENT_OTHER)
Admission: RE | Admit: 2023-10-07 | Discharge: 2023-10-07 | Disposition: A | Discharge status: Home / Self Care | Payer: Medicare HMO | Source: Ambulatory Visit | Attending: Urology | Admitting: Urology

## 2023-10-07 DIAGNOSIS — N132 Hydronephrosis with renal and ureteral calculous obstruction: Secondary | ICD-10-CM | POA: Diagnosis not present

## 2023-10-07 DIAGNOSIS — K8020 Calculus of gallbladder without cholecystitis without obstruction: Secondary | ICD-10-CM | POA: Diagnosis not present

## 2023-10-07 DIAGNOSIS — C678 Malignant neoplasm of overlapping sites of bladder: Secondary | ICD-10-CM | POA: Insufficient documentation

## 2023-10-07 DIAGNOSIS — C679 Malignant neoplasm of bladder, unspecified: Secondary | ICD-10-CM | POA: Diagnosis not present

## 2023-10-07 DIAGNOSIS — R319 Hematuria, unspecified: Secondary | ICD-10-CM | POA: Diagnosis not present

## 2023-10-07 MED ORDER — IOHEXOL 300 MG/ML  SOLN
125.0000 mL | Freq: Once | INTRAMUSCULAR | Status: AC | PRN
  Administered 2023-10-07: 125 mL via INTRAVENOUS

## 2023-10-09 ENCOUNTER — Encounter: Payer: Self-pay | Admitting: Family Medicine

## 2023-10-09 ENCOUNTER — Ambulatory Visit (INDEPENDENT_AMBULATORY_CARE_PROVIDER_SITE_OTHER): Payer: Medicare HMO | Admitting: Family Medicine

## 2023-10-09 VITALS — BP 116/60 | HR 62 | Temp 97.9°F | Resp 18 | Ht 68.0 in | Wt 201.5 lb

## 2023-10-09 DIAGNOSIS — Z0000 Encounter for general adult medical examination without abnormal findings: Secondary | ICD-10-CM | POA: Diagnosis not present

## 2023-10-09 NOTE — Progress Notes (Signed)
Subjective:   Dominic Ray is a 67 y.o. male who presents for Medicare Annual/Subsequent preventive examination.  Visit Complete: In person  Patient Medicare AWV questionnaire was completed by the patient on 10/09/23; I have confirmed that all information answered by patient is correct and no changes since this date.  Cardiac Risk Factors include: advanced age (>18men, >56 women);obesity (BMI >30kg/m2)     Objective:    Today's Vitals   10/09/23 0900  BP: 116/60  Pulse: 62  Resp: 18  Temp: 97.9 F (36.6 C)  SpO2: 97%  Weight: 201 lb 8 oz (91.4 kg)  Height: 5\' 8"  (1.727 m)   Body mass index is 30.64 kg/m.     10/09/2023    9:08 AM 08/22/2023    9:18 AM 08/17/2023   12:55 PM 07/14/2023   11:10 AM 10/03/2022    8:14 AM 05/08/2017    6:29 AM  Advanced Directives  Does Patient Have a Medical Advance Directive? Yes Yes Yes Yes No Yes  Type of Estate agent of [place];Living will Healthcare Power of [place];Living will Healthcare Power of [place];Living will Healthcare Power of [place];Living will  Healthcare Power of [place];Living will  Does patient want to make changes to medical advance directive? No - Patient declined No - Patient declined    No - Patient declined  Copy of Healthcare Power of Attorney in Chart?  No - copy requested  No - copy requested  No - copy requested  Would patient like information on creating a medical advance directive?     Yes (MAU/Ambulatory/Procedural Areas - Information given)     Current Medications (verified) Outpatient Encounter Medications as of 10/09/2023  Medication Sig   amLODipine (NORVASC) 5 MG tablet TAKE 1 TABLET (5 MG TOTAL) BY MOUTH DAILY.   aspirin EC 81 MG tablet Take 1 tablet (81 mg total) by mouth daily.   atorvastatin (LIPITOR) 80 MG tablet TAKE 1 TABLET BY MOUTH EVERY DAY   finasteride (PROSCAR) 5 MG tablet Take 1 tablet (5 mg total) by mouth daily.   metoprolol succinate (TOPROL-XL) 25 MG 24 hr  tablet Take 0.5 tablets (12.5 mg total) by mouth daily.   tamsulosin (FLOMAX) 0.4 MG CAPS capsule Take 1 capsule (0.4 mg total) by mouth daily.   acetaminophen (TYLENOL) 500 MG tablet Take 500 mg by mouth every 6 (six) hours as needed for moderate pain.   No facility-administered encounter medications on file as of 10/09/2023.    Allergies (verified) [name] [lidocaine]   History: Past Medical History:  Diagnosis Date   Asthma    AS A CHILD   Basal cell carcinoma of nasal tip 08/24/2012   Coronary artery disease    a. Abnl stress test -> s/p CABGx4 in 04/2017 with LIMA-LAD, SVG-diag, SVG-AM, SVG-PDA.   Heart murmur    Hyperlipidemia    Hypertension    Male hypogonadism 08/24/2012   Onychomycosis 08/24/2012   Pneumonia    Past Surgical History:  Procedure Laterality Date   CARDIAC CATHETERIZATION  05/08/2017   CARDIOVASCULAR STRESS TEST  04/2017   CORONARY ARTERY BYPASS GRAFT N/A 05/09/2017   Procedure: CORONARY ARTERY BYPASS GRAFTING times four using left internal mammary artery and right leg saphenous vein  ;  Surgeon: Alleen Borne, MD;  Location: [facility] OR;  Service: Open Heart Surgery;  Laterality: N/A;   LEFT HEART CATH AND CORONARY ANGIOGRAPHY N/A 05/08/2017   Procedure: Left Heart Cath and Coronary Angiography;  Surgeon: Lennette Bihari, MD;  Location:  [facility] INVASIVE CV LAB;  Service: Cardiovascular;  Laterality: N/A;   TEE WITHOUT CARDIOVERSION N/A 05/09/2017   Procedure: TRANSESOPHAGEAL ECHOCARDIOGRAM (TEE);  Surgeon: Alleen Borne, MD;  Location: [facility] OR;  Service: Open Heart Surgery;  Laterality: N/A;   TONSILLECTOMY  1968   TRANSURETHRAL RESECTION OF BLADDER TUMOR Bilateral 07/18/2023   Procedure: TRANSURETHRAL RESECTION OF BLADDER TUMOR (TURBT);  Surgeon: [name], MD;  Location: [facility] ORS;  Service: Urology;  Laterality: Bilateral;   TRANSURETHRAL RESECTION OF BLADDER TUMOR Bilateral 08/22/2023   Procedure: TRANSURETHRAL RESECTION OF BLADDER TUMOR (TURBT);  Surgeon:  [name], MD;  Location: [facility] ORS;  Service: Urology;  Laterality: Bilateral;   Family History  Problem Relation Age of Onset   Cancer Mother    CAD Father        Died of MI at age 59   Breast cancer Maternal Grandfather    Colon cancer Neg Hx    Stomach cancer Neg Hx    Colon polyps Neg Hx    Crohn's disease Neg Hx    Esophageal cancer Neg Hx    Rectal cancer Neg Hx    Ulcerative colitis Neg Hx    Social History   Socioeconomic History   Marital status: Married    Spouse name: Inocencio Homes   Number of children: 0   Years of education: 16   Highest education level: Bachelor's degree (e.g., BA, AB, BS)  Occupational History    Comment: Unemployed   Occupation: Retired  Tobacco Use   Smoking status: Never   Smokeless tobacco: Never  Vaping Use   Vaping status: Never Used  Substance and Sexual Activity   Alcohol use: Yes    Comment: Occasional   Drug use: No   Sexual activity: Not on file  Other Topics Concern   Not on file  Social History Narrative   Lives with his wife. He enjoys playing pickle ball.   Social Determinants of Health   Financial Resource Strain: Low Risk  (10/09/2023)   Overall Financial Resource Strain (CARDIA)    Difficulty of Paying Living Expenses: Not hard at all  Food Insecurity: No Food Insecurity (10/09/2023)   Hunger Vital Sign    Worried About Running Out of Food in the Last Year: Never true    Ran Out of Food in the Last Year: Never true  Transportation Needs: No Transportation Needs (10/09/2023)   PRAPARE - Administrator, Civil Service (Medical): No    Lack of Transportation (Non-Medical): No  Physical Activity: Sufficiently Active (10/09/2023)   Exercise Vital Sign    Days of Exercise per Week: 4 days    Minutes of Exercise per Session: 40 min  Stress: No Stress Concern Present (10/09/2023)   [employer] of Occupational Health - Occupational Stress Questionnaire    Feeling of Stress : Not at all  Social  Connections: Socially Integrated (10/09/2023)   Social Connection and Isolation Panel [NHANES]    Frequency of Communication with Friends and Family: Three times a week    Frequency of Social Gatherings with Friends and Family: Three times a week    Attends Religious Services: More than 4 times per year    Active Member of Clubs or Organizations: Yes    Attends Banker Meetings: More than 4 times per year    Marital Status: Married    Tobacco Counseling Counseling given: Not Answered   Clinical Intake:  Pre-visit preparation completed: Yes  Pain : No/denies pain  BMI - recorded: 30.6 Nutritional Status: BMI > 30  Obese Nutritional Risks: None Diabetes: No  How often do you need to have someone help you when you read instructions, pamphlets, or other written materials from your doctor or pharmacy?: 1 - Never  Interpreter Needed?: No      Activities of Daily Living    10/09/2023    9:02 AM 10/09/2023    8:33 AM  In your present state of health, do you have any difficulty performing the following activities:  Hearing? 0 0  Vision? 0 0  Difficulty concentrating or making decisions? 0 0  Walking or climbing stairs? 0 0  Dressing or bathing? 0 0  Doing errands, shopping? 0 0  Preparing Food and eating ? N N  Using the Toilet? N N  In the past six months, have you accidently leaked urine? Y Y  Do you have problems with loss of bowel control? N N  Managing your Medications? N N  Managing your Finances? N N  Housekeeping or managing your Housekeeping? N N    Patient Care Team: Monica Becton, MD as PCP - General (Family Medicine) Jens Som Madolyn Frieze, MD as PCP - Cardiology (Cardiology) Natasha Bence, MD Urology   Indicate any recent Medical Services you may have received from other than [facility] providers in the past year (date may be approximate).     Assessment:   This is a routine wellness examination for [facility].  Hearing/Vision  screen Hearing Screening - Comments:: Did not test, grossly intact. Vision Screening - Comments:: Did not test,  grossly intact    Goals Addressed             This Visit's Progress    Mobility and Independence Optimized       Evidence-based guidance:  Refer to physical therapy or occupational therapy for assessment and individualized program.  Provide therapy that may include functional task training, balance, active or passive exercise, spasticity management, assistive device training, cardiorespiratory fitness, Pilates and telerehabilitation services.  Identify barriers to participation in therapy or exercise such as pain with activity, anticipated or imagined pain, transportation, depression or fear.  Work with patient and family to develop self-management plan to remove barriers.  Refer to speech/language pathologist to assess and treat speech/language deficit and swallowing impairment.  Periodically review ability to perform activities of daily living and required amount of assistance needed for safety, optimal independence and self-care.  Encourage appropriate vocational or educational counseling for re-entering the community, workplace or school; consider a driving evaluation.  Assist patient to advocate for necessary adaptations to the work or school environment.  Refer to employer for information about FMLA (Family and Medical Leave Act), Short or Long-Term Disability and options for adjustments to work schedule, assignment or hours.   Notes: plays pickle ball       Depression Screen    10/09/2023    9:10 AM 10/09/2023    9:07 AM 10/03/2022    8:14 AM 05/12/2021   11:00 AM 11/26/2018    2:35 PM 10/11/2017   11:01 AM 07/10/2017    9:43 AM  PHQ 2/9 Scores  PHQ - 2 Score 0 0 0 1 2 0 0  PHQ- 9 Score     6      Fall Risk    10/09/2023    9:09 AM 10/09/2023    8:33 AM 10/03/2022    8:15 AM 05/12/2021   11:00 AM 07/06/2017    8:37 AM  Fall Risk  Falls in the past year?  0 0 0 0 No  Number falls in past yr: 0 0 0 0   Injury with Fall? 0 0 0    Risk for fall due to : No Fall Risks  No Fall Risks    Follow up   Falls evaluation completed Falls evaluation completed     MEDICARE RISK AT HOME: Medicare Risk at Home Any stairs in or around the home?: No If so, are there any without handrails?: No Home free of loose throw rugs in walkways, pet beds, electrical cords, etc?: Yes Adequate lighting in your home to reduce risk of falls?: Yes Life alert?: No Use of a cane, walker or w/c?: No Grab bars in the bathroom?: No Shower chair or bench in shower?: Yes Elevated toilet seat or a handicapped toilet?: No  TIMED UP AND GO:  Was the test performed?  7 seconds steady gait without assistance devices.   Cognitive Function:        10/09/2023    9:10 AM 10/03/2022    8:23 AM  6CIT Screen  What Year? 0 points 0 points  What month? 0 points 0 points  What time? 0 points 0 points  Count back from 20 0 points 0 points  Months in reverse 0 points 0 points  Repeat phrase 0 points 0 points  Total Score 0 points 0 points    Immunizations Immunization History  Administered Date(s) Administered   PFIZER(Purple Top)SARS-COV-2 Vaccination 04/27/2020, 06/13/2020   PNEUMOCOCCAL CONJUGATE-20 10/11/2022   Tdap 08/24/2012, 10/11/2022   Zoster Recombinant(Shingrix) 05/14/2021, 07/20/2021    TDAP status: Up to date  Flu Vaccine status: Due, Education has been provided regarding the importance of this vaccine. Advised may receive this vaccine at local pharmacy or Health Dept. Aware to provide a copy of the vaccination record if obtained from local pharmacy or Health Dept. Verbalized acceptance and understanding.  Pneumococcal vaccine status: Up to date  Covid-19 vaccine status: Declined, Education has been provided regarding the importance of this vaccine but patient still declined. Advised may receive this vaccine at local pharmacy or Health Dept.or vaccine  clinic. Aware to provide a copy of the vaccination record if obtained from local pharmacy or Health Dept. Verbalized acceptance and understanding.  Qualifies for Shingles Vaccine? Yes   Zostavax completed No   Shingrix Completed?: Yes  Screening Tests Health Maintenance  Topic Date Due   COVID-19 Vaccine (3 - Pfizer risk series) 07/11/2020   INFLUENZA VACCINE  Never done   MAMMOGRAM  12/13/2023   Medicare Annual Wellness (AWV)  10/08/2024   Colonoscopy  12/06/2029   DTaP/Tdap/Td (3 - Td or Tdap) 10/11/2032   Pneumonia Vaccine 71+ Years old  Completed   Hepatitis C Screening  Completed   Zoster Vaccines- Shingrix  Completed   HPV VACCINES  Aged Out    Health Maintenance  Health Maintenance Due  Topic Date Due   COVID-19 Vaccine (3 - Pfizer risk series) 07/11/2020   INFLUENZA VACCINE  Never done    Colorectal cancer screening: Type of screening: Colonoscopy. Completed 12/06/2022. Repeat every 10 years  Lung Cancer Screening: (Low Dose CT Chest recommended if Age 65-80 years, 20 pack-year currently smoking OR have quit w/in 15years.) does not qualify.   Lung Cancer Screening Referral: n/a   Additional Screening:  Hepatitis C Screening: does qualify; Completed 1.28.19  Vision Screening: Recommended annual ophthalmology exams for early detection of glaucoma and other disorders of the eye. Is the patient up to  date with their annual eye exam?  No  will schedule appointment soon  Who is the provider or what is the name of the office in which the patient attends annual eye exams? My eye doctor If pt is not established with a provider, would they like to be referred to a provider to establish care? No .   Dental Screening: Recommended annual dental exams for proper oral hygiene  Diabetic Foot Exam: n/a   Community Resource Referral / Chronic Care Management: CRR required this visit?  No   CCM required this visit?  No     Plan:     I have personally reviewed and noted  the following in the patient's chart:   Medical and social history Use of alcohol, tobacco or illicit drugs  Current medications and supplements including opioid prescriptions. Patient is not currently taking opioid prescriptions. Functional ability and status Nutritional status Physical activity Advanced directives List of other physicians Hospitalizations, surgeries, and ER visits in previous 12 months Vitals Screenings to include cognitive, depression, and falls Referrals and appointments  In addition, I have reviewed and discussed with patient certain preventive protocols, quality metrics, and best practice recommendations. A written personalized care plan for preventive services as well as general preventive health recommendations were provided to patient.     Novella Olive, FNP   10/09/2023   After Visit Summary: (In Person-Declined) Patient declined AVS at this time.  Follow-up with PCP as scheduled.  Vaccines at local pharmacy.

## 2023-10-11 ENCOUNTER — Ambulatory Visit: Payer: Medicare HMO | Admitting: Urology

## 2023-10-18 ENCOUNTER — Ambulatory Visit: Payer: Self-pay | Admitting: Urology

## 2023-10-18 ENCOUNTER — Ambulatory Visit: Payer: Medicare HMO | Admitting: Urology

## 2023-10-18 ENCOUNTER — Encounter: Payer: Self-pay | Admitting: Urology

## 2023-10-18 VITALS — BP 156/78 | HR 57

## 2023-10-18 DIAGNOSIS — N1330 Unspecified hydronephrosis: Secondary | ICD-10-CM

## 2023-10-18 DIAGNOSIS — C678 Malignant neoplasm of overlapping sites of bladder: Secondary | ICD-10-CM | POA: Diagnosis not present

## 2023-10-18 DIAGNOSIS — Z8551 Personal history of malignant neoplasm of bladder: Secondary | ICD-10-CM | POA: Diagnosis not present

## 2023-10-18 LAB — URINALYSIS, ROUTINE W REFLEX MICROSCOPIC
Bilirubin, UA: NEGATIVE
Glucose, UA: NEGATIVE
Ketones, UA: NEGATIVE
Nitrite, UA: NEGATIVE
Specific Gravity, UA: >1.03 — ABNORMAL HIGH (ref 1.005–1.030)
Urobilinogen, Ur: 0.2 mg/dL (ref 0.2–1.0)
pH, UA: 5.5 (ref 5.0–7.5)

## 2023-10-18 LAB — MICROSCOPIC EXAMINATION: RBC, Urine: >30 /[HPF] — ABNORMAL HIGH (ref 0–2)

## 2023-10-18 NOTE — H&P (View-Only) (Signed)
 Assessment: 1. Malignant neoplasm of overlapping sites of bladder (HCC)   2. Bilateral hydronephrosis     Plan: Today I had a long discussion with the patient regarding his bilateral hydro and concerns for partial ureteral obstruction.  I told him that I him concerned that initiating BCG in this setting could make the obstruction worse due to inflammatory changes.  Therefore I have recommended that we proceed with cystoscopy under anesthesia to reinspect the bladder ensure no residual gross tumor is present and to try to access and stent both ureters to protect them during BCG treatment.  The patient today had numerous questions which I answered and he agrees to proceed with plan as above.  Will schedule next available.  Chief Complaint: Bladder cancer  HPI: Dominic Ray is a 66 y.o. male who presents for continued evaluation of high risk nonmuscle invasive bladder cancer.  Patient reports clinically doing very well.  Lower urinary tract symptoms continue to improve.  He has not had any gross hematuria.  Urinalysis continues to show significant microscopic hematuria but much less pyuria.  See my note 06/14/2023 at time of initial visit for detailed history. Presented with worsening LUTS and gross hematuria.   PSA 06/2023=  3.1 Prostate volume (US)= 71ml   Heme eval-- Office cysto demonstrated bladder tumor CT hematuria profile 06/20/2023-bladder wall thickening concerning for bladder cancer without evidence of metastatic disease or upper tract abnormalities.   Bladder cancer summary: Status post TURBT 07/18/2023-pathology high-grade T1-muscularis propria present but uninvolved--large area of tumor involvement trigone, right floor/lateral wall and around bladder neck. RUO not identified.  Left UO seen   Status post repeat TURBT 08/22/2023-pathology high-grade TA/question focal T1.  Muscularis propria present but uninvolved RUO again not visualized.  Left UO transected and wide open with clear  efflux   Renal US 09/2023-- mild-mod bilateral hydro and marked bladder wall thickening Lab:  09/2023-- CR 0.94  CT-hematuria protocol 10/07/2023-mild to moderate bilateral hydro to the level of the bladder with contrast seen entering bladder.    Portions of the above documentation were copied from a prior visit for review purposes only.  Allergies: Allergies  Allergen Reactions   [name] [Lidocaine] Rash    Anything ending in (-caine) per patient     PMH: Past Medical History:  Diagnosis Date   Asthma    AS A CHILD   Basal cell carcinoma of nasal tip 08/24/2012   Coronary artery disease    a. Abnl stress test -> s/p CABGx4 in 04/2017 with LIMA-LAD, SVG-diag, SVG-AM, SVG-PDA.   Heart murmur    Hyperlipidemia    Hypertension    Male hypogonadism 08/24/2012   Onychomycosis 08/24/2012   Pneumonia     PSH: Past Surgical History:  Procedure Laterality Date   CARDIAC CATHETERIZATION  05/08/2017   CARDIOVASCULAR STRESS TEST  04/2017   CORONARY ARTERY BYPASS GRAFT N/A 05/09/2017   Procedure: CORONARY ARTERY BYPASS GRAFTING times four using left internal mammary artery and right leg saphenous vein  ;  Surgeon: Alleen Borne, MD;  Location: [facility] OR;  Service: Open Heart Surgery;  Laterality: N/A;   LEFT HEART CATH AND CORONARY ANGIOGRAPHY N/A 05/08/2017   Procedure: Left Heart Cath and Coronary Angiography;  Surgeon: Lennette Bihari, MD;  Location: [facility] INVASIVE CV LAB;  Service: Cardiovascular;  Laterality: N/A;   TEE WITHOUT CARDIOVERSION N/A 05/09/2017   Procedure: TRANSESOPHAGEAL ECHOCARDIOGRAM (TEE);  Surgeon: Alleen Borne, MD;  Location: [facility] OR;  Service: Open Heart Surgery;  Laterality:  N/A;   TONSILLECTOMY  1968   TRANSURETHRAL RESECTION OF BLADDER TUMOR Bilateral 07/18/2023   Procedure: TRANSURETHRAL RESECTION OF BLADDER TUMOR (TURBT);  Surgeon: [name], MD;  Location: [facility] ORS;  Service: Urology;  Laterality: Bilateral;   TRANSURETHRAL RESECTION OF BLADDER TUMOR  Bilateral 08/22/2023   Procedure: TRANSURETHRAL RESECTION OF BLADDER TUMOR (TURBT);  Surgeon: [name], MD;  Location: [facility] ORS;  Service: Urology;  Laterality: Bilateral;    SH: Social History   Tobacco Use   Smoking status: Never   Smokeless tobacco: Never  Vaping Use   Vaping status: Never Used  Substance Use Topics   Alcohol use: Yes    Comment: Occasional   Drug use: No    ROS: Constitutional:  Negative for fever, chills, weight loss CV: Negative for chest pain, previous MI, hypertension Respiratory:  Negative for shortness of breath, wheezing, sleep apnea, frequent cough GI:  Negative for nausea, vomiting, bloody stool, GERD  PE: BP (!) 156/78   Pulse (!) 57  GENERAL APPEARANCE:  Well appearing, well developed, well nourished, NAD HEENT:  Atraumatic, normocephalic, oropharynx clear COR:  RR LUNGS:  CTA ABDOMEN:  Soft, non-tender, no masses EXTREMITIES:  Moves all extremities well, without clubbing, cyanosis, or edema NEUROLOGIC:  Alert and oriented x 3, normal gait, CN II-XII grossly intact MENTAL STATUS:  appropriate BACK:  Non-tender to palpation, No CVAT SKIN:  Warm, dry, and intact   Results: UA continues to show significant microscopic hematuria as well as low-level pyuria and few bacteria-will send for culture

## 2023-10-18 NOTE — Progress Notes (Signed)
Assessment: 1. Malignant neoplasm of overlapping sites of bladder (HCC)   2. Bilateral hydronephrosis     Plan: Today I had a long discussion with the patient regarding his bilateral hydro and concerns for partial ureteral obstruction.  I told him that I him concerned that initiating BCG in this setting could make the obstruction worse due to inflammatory changes.  Therefore I have recommended that we proceed with cystoscopy under anesthesia to reinspect the bladder ensure no residual gross tumor is present and to try to access and stent both ureters to protect them during BCG treatment.  The patient today had numerous questions which I answered and he agrees to proceed with plan as above.  Will schedule next available.  Chief Complaint: Bladder cancer  HPI: Dominic Ray is a 66 y.o. male who presents for continued evaluation of high risk nonmuscle invasive bladder cancer.  Patient reports clinically doing very well.  Lower urinary tract symptoms continue to improve.  He has not had any gross hematuria.  Urinalysis continues to show significant microscopic hematuria but much less pyuria.  See my note 06/14/2023 at time of initial visit for detailed history. Presented with worsening LUTS and gross hematuria.   PSA 06/2023=  3.1 Prostate volume (US)= 71ml   Heme eval-- Office cysto demonstrated bladder tumor CT hematuria profile 06/20/2023-bladder wall thickening concerning for bladder cancer without evidence of metastatic disease or upper tract abnormalities.   Bladder cancer summary: Status post TURBT 07/18/2023-pathology high-grade T1-muscularis propria present but uninvolved--large area of tumor involvement trigone, right floor/lateral wall and around bladder neck. RUO not identified.  Left UO seen   Status post repeat TURBT 08/22/2023-pathology high-grade TA/question focal T1.  Muscularis propria present but uninvolved RUO again not visualized.  Left UO transected and wide open with clear  efflux   Renal US 09/2023-- mild-mod bilateral hydro and marked bladder wall thickening Lab:  09/2023-- CR 0.94  CT-hematuria protocol 10/07/2023-mild to moderate bilateral hydro to the level of the bladder with contrast seen entering bladder.    Portions of the above documentation were copied from a prior visit for review purposes only.  Allergies: Allergies  Allergen Reactions   [name] [Lidocaine] Rash    Anything ending in (-caine) per patient     PMH: Past Medical History:  Diagnosis Date   Asthma    AS A CHILD   Basal cell carcinoma of nasal tip 08/24/2012   Coronary artery disease    a. Abnl stress test -> s/p CABGx4 in 04/2017 with LIMA-LAD, SVG-diag, SVG-AM, SVG-PDA.   Heart murmur    Hyperlipidemia    Hypertension    Male hypogonadism 08/24/2012   Onychomycosis 08/24/2012   Pneumonia     PSH: Past Surgical History:  Procedure Laterality Date   CARDIAC CATHETERIZATION  05/08/2017   CARDIOVASCULAR STRESS TEST  04/2017   CORONARY ARTERY BYPASS GRAFT N/A 05/09/2017   Procedure: CORONARY ARTERY BYPASS GRAFTING times four using left internal mammary artery and right leg saphenous vein  ;  Surgeon: Alleen Borne, MD;  Location: [facility] OR;  Service: Open Heart Surgery;  Laterality: N/A;   LEFT HEART CATH AND CORONARY ANGIOGRAPHY N/A 05/08/2017   Procedure: Left Heart Cath and Coronary Angiography;  Surgeon: Lennette Bihari, MD;  Location: [facility] INVASIVE CV LAB;  Service: Cardiovascular;  Laterality: N/A;   TEE WITHOUT CARDIOVERSION N/A 05/09/2017   Procedure: TRANSESOPHAGEAL ECHOCARDIOGRAM (TEE);  Surgeon: Alleen Borne, MD;  Location: [facility] OR;  Service: Open Heart Surgery;  Laterality:  N/A;   TONSILLECTOMY  1968   TRANSURETHRAL RESECTION OF BLADDER TUMOR Bilateral 07/18/2023   Procedure: TRANSURETHRAL RESECTION OF BLADDER TUMOR (TURBT);  Surgeon: [name], MD;  Location: [facility] ORS;  Service: Urology;  Laterality: Bilateral;   TRANSURETHRAL RESECTION OF BLADDER TUMOR  Bilateral 08/22/2023   Procedure: TRANSURETHRAL RESECTION OF BLADDER TUMOR (TURBT);  Surgeon: [name], MD;  Location: [facility] ORS;  Service: Urology;  Laterality: Bilateral;    SH: Social History   Tobacco Use   Smoking status: Never   Smokeless tobacco: Never  Vaping Use   Vaping status: Never Used  Substance Use Topics   Alcohol use: Yes    Comment: Occasional   Drug use: No    ROS: Constitutional:  Negative for fever, chills, weight loss CV: Negative for chest pain, previous MI, hypertension Respiratory:  Negative for shortness of breath, wheezing, sleep apnea, frequent cough GI:  Negative for nausea, vomiting, bloody stool, GERD  PE: BP (!) 156/78   Pulse (!) 57  GENERAL APPEARANCE:  Well appearing, well developed, well nourished, NAD HEENT:  Atraumatic, normocephalic, oropharynx clear COR:  RR LUNGS:  CTA ABDOMEN:  Soft, non-tender, no masses EXTREMITIES:  Moves all extremities well, without clubbing, cyanosis, or edema NEUROLOGIC:  Alert and oriented x 3, normal gait, CN II-XII grossly intact MENTAL STATUS:  appropriate BACK:  Non-tender to palpation, No CVAT SKIN:  Warm, dry, and intact   Results: UA continues to show significant microscopic hematuria as well as low-level pyuria and few bacteria-will send for culture

## 2023-10-19 ENCOUNTER — Encounter (HOSPITAL_BASED_OUTPATIENT_CLINIC_OR_DEPARTMENT_OTHER): Payer: Self-pay | Admitting: Urology

## 2023-10-19 LAB — URINE CULTURE: Organism ID, Bacteria: NO GROWTH

## 2023-10-23 ENCOUNTER — Other Ambulatory Visit: Payer: Self-pay

## 2023-10-23 ENCOUNTER — Encounter (HOSPITAL_BASED_OUTPATIENT_CLINIC_OR_DEPARTMENT_OTHER): Payer: Self-pay | Admitting: Urology

## 2023-10-23 NOTE — Progress Notes (Signed)
Spoke w/ via phone for pre-op interview---Dominic Ray needs dos----none         Ray results------10/02/23 BMP in Epic, 07/05/23 EKG in Epic COVID test -----patient states asymptomatic no test needed Arrive at -------1300 on Wednesday, 10/25/2023 NPO after MN NO Solid Food.  Clear liquids from MN until---1200 Med rec completed Medications to take morning of surgery -----none Patient stated that he was instructed by his surgeon to hold ASA 81 mg x 1 week before surgery. Diabetic medication -----n/a Patient instructed no nail polish to be worn day of surgery Patient instructed to bring photo id and insurance card day of surgery Patient aware to have Driver (ride ) / caregiver    for 24 hours after surgery - wife, Inocencio Homes Patient Special Instructions -----none Pre-Op special Instructions -----none Patient verbalized understanding of instructions that were given at this phone interview. Patient denies chest pain, sob, fever, cough at the interview.   Anesthesia Review: S/P CABG x 4 2018 PCP: Dr. Lenda Kelp, LOV 06/27/23 Cardiologist : Dr. Olga Millers, LOV 07/05/23 Chest x-ray : EKG : 07/05/23 EKG in Epic & chart Echo : 07/25/23 Echo (normal LV function, mild aortic stenosis) Stress test: 2018 Cardiac Cath : 2018 Activity level:  Sleep Study/ CPAP : Fasting Blood Sugar :      / Checks Blood Sugar -- times a day:   Blood Thinner/ Instructions /Last Dose: ASA / Instructions/ Last Dose : Per patient, he was instructed by his surgeon to stop ASA 81 mg x 1 week before surgery.

## 2023-10-25 ENCOUNTER — Encounter (HOSPITAL_BASED_OUTPATIENT_CLINIC_OR_DEPARTMENT_OTHER): Payer: Self-pay | Admitting: Urology

## 2023-10-25 ENCOUNTER — Encounter (HOSPITAL_BASED_OUTPATIENT_CLINIC_OR_DEPARTMENT_OTHER)
Admission: RE | Disposition: A | Discharge status: Home / Self Care | Payer: Self-pay | Source: Home / Self Care | Attending: Urology

## 2023-10-25 ENCOUNTER — Ambulatory Visit (HOSPITAL_BASED_OUTPATIENT_CLINIC_OR_DEPARTMENT_OTHER): Payer: Medicare HMO | Admitting: Anesthesiology

## 2023-10-25 ENCOUNTER — Ambulatory Visit (HOSPITAL_BASED_OUTPATIENT_CLINIC_OR_DEPARTMENT_OTHER)
Admission: RE | Admit: 2023-10-25 | Discharge: 2023-10-25 | Disposition: A | Discharge status: Home / Self Care | Payer: Medicare HMO | Attending: Urology | Admitting: Urology

## 2023-10-25 ENCOUNTER — Other Ambulatory Visit: Payer: Self-pay

## 2023-10-25 DIAGNOSIS — C678 Malignant neoplasm of overlapping sites of bladder: Secondary | ICD-10-CM | POA: Insufficient documentation

## 2023-10-25 DIAGNOSIS — I351 Nonrheumatic aortic (valve) insufficiency: Secondary | ICD-10-CM | POA: Insufficient documentation

## 2023-10-25 DIAGNOSIS — R310 Gross hematuria: Secondary | ICD-10-CM | POA: Insufficient documentation

## 2023-10-25 DIAGNOSIS — N1330 Unspecified hydronephrosis: Secondary | ICD-10-CM | POA: Insufficient documentation

## 2023-10-25 DIAGNOSIS — I2510 Atherosclerotic heart disease of native coronary artery without angina pectoris: Secondary | ICD-10-CM | POA: Insufficient documentation

## 2023-10-25 DIAGNOSIS — J45909 Unspecified asthma, uncomplicated: Secondary | ICD-10-CM | POA: Insufficient documentation

## 2023-10-25 DIAGNOSIS — I10 Essential (primary) hypertension: Secondary | ICD-10-CM | POA: Insufficient documentation

## 2023-10-25 DIAGNOSIS — Z951 Presence of aortocoronary bypass graft: Secondary | ICD-10-CM | POA: Diagnosis not present

## 2023-10-25 DIAGNOSIS — C679 Malignant neoplasm of bladder, unspecified: Secondary | ICD-10-CM

## 2023-10-25 DIAGNOSIS — Z01818 Encounter for other preprocedural examination: Secondary | ICD-10-CM

## 2023-10-25 HISTORY — PX: TRANSURETHRAL RESECTION OF BLADDER TUMOR: SHX2575

## 2023-10-25 HISTORY — PX: CYSTOSCOPY WITH RETROGRADE PYELOGRAM, URETEROSCOPY AND STENT PLACEMENT: SHX5789

## 2023-10-25 SURGERY — CYSTOURETEROSCOPY, WITH RETROGRADE PYELOGRAM AND STENT INSERTION
Anesthesia: General | Site: Renal

## 2023-10-25 MED ORDER — DEXMEDETOMIDINE HCL IN NACL 80 MCG/20ML IV SOLN
INTRAVENOUS | Status: AC
Start: 2023-10-25 — End: ?
  Filled 2023-10-25: qty 20

## 2023-10-25 MED ORDER — ONDANSETRON HCL 4 MG/2ML IJ SOLN
INTRAMUSCULAR | Status: DC | PRN
  Administered 2023-10-25: 4 mg via INTRAVENOUS

## 2023-10-25 MED ORDER — FUROSEMIDE 10 MG/ML IJ SOLN
INTRAMUSCULAR | Status: AC
  Filled 2023-10-25: qty 2

## 2023-10-25 MED ORDER — OXYBUTYNIN CHLORIDE 5 MG PO TABS: 5.0000 mg | ORAL_TABLET | Freq: Three times a day (TID) | ORAL | 3 refills | Status: DC | PRN

## 2023-10-25 MED ORDER — PROPOFOL 10 MG/ML IV BOLUS
INTRAVENOUS | Status: DC | PRN
  Administered 2023-10-25: 150 mg via INTRAVENOUS

## 2023-10-25 MED ORDER — ACETAMINOPHEN 500 MG PO TABS
1000.0000 mg | ORAL_TABLET | Freq: Once | ORAL | Status: AC
  Administered 2023-10-25: 1000 mg via ORAL

## 2023-10-25 MED ORDER — FLUORESCEIN SODIUM 10 % IV SOLN
INTRAVENOUS | Status: AC
Start: 2023-10-25 — End: ?
  Filled 2023-10-25: qty 5

## 2023-10-25 MED ORDER — IOHEXOL 300 MG/ML  SOLN
INTRAMUSCULAR | Status: DC | PRN
  Administered 2023-10-25: 8 mL via URETHRAL

## 2023-10-25 MED ORDER — SODIUM CHLORIDE 0.9 % IV SOLN
1.0000 g | INTRAVENOUS | Status: AC
  Administered 2023-10-25: 2 g via INTRAVENOUS

## 2023-10-25 MED ORDER — KETOROLAC TROMETHAMINE 30 MG/ML IJ SOLN
INTRAMUSCULAR | Status: DC | PRN
  Administered 2023-10-25: 30 mg via INTRAVENOUS

## 2023-10-25 MED ORDER — MIDAZOLAM HCL 2 MG/2ML IJ SOLN
INTRAMUSCULAR | Status: DC | PRN
  Administered 2023-10-25: 2 mg via INTRAVENOUS

## 2023-10-25 MED ORDER — FENTANYL CITRATE (PF) 100 MCG/2ML IJ SOLN
INTRAMUSCULAR | Status: AC
  Filled 2023-10-25: qty 2

## 2023-10-25 MED ORDER — ONDANSETRON HCL 4 MG/2ML IJ SOLN
INTRAMUSCULAR | Status: AC
Start: 2023-10-25 — End: ?
  Filled 2023-10-25: qty 2

## 2023-10-25 MED ORDER — DEXAMETHASONE SODIUM PHOSPHATE 4 MG/ML IJ SOLN
INTRAMUSCULAR | Status: DC | PRN
  Administered 2023-10-25 (×2): 5 mg via INTRAVENOUS

## 2023-10-25 MED ORDER — FUROSEMIDE 10 MG/ML IJ SOLN
INTRAMUSCULAR | Status: DC | PRN
  Administered 2023-10-25: 10 mg via INTRAMUSCULAR

## 2023-10-25 MED ORDER — CEFTRIAXONE SODIUM 1 G IJ SOLR
INTRAMUSCULAR | Status: AC
Start: 2023-10-25 — End: ?
  Filled 2023-10-25: qty 10

## 2023-10-25 MED ORDER — SODIUM CHLORIDE 0.9 % IV SOLN: INTRAVENOUS | Status: DC

## 2023-10-25 MED ORDER — FENTANYL CITRATE (PF) 100 MCG/2ML IJ SOLN
INTRAMUSCULAR | Status: DC | PRN
  Administered 2023-10-25 (×4): 25 ug via INTRAVENOUS
  Administered 2023-10-25: 50 ug via INTRAVENOUS
  Administered 2023-10-25 (×2): 25 ug via INTRAVENOUS

## 2023-10-25 MED ORDER — EPHEDRINE SULFATE (PRESSORS) 50 MG/ML IJ SOLN
INTRAMUSCULAR | Status: DC | PRN
  Administered 2023-10-25: 10 mg via INTRAVENOUS

## 2023-10-25 MED ORDER — STERILE WATER FOR IRRIGATION IR SOLN
Status: DC | PRN
  Administered 2023-10-25 (×2): 3000 mL

## 2023-10-25 MED ORDER — DROPERIDOL 2.5 MG/ML IJ SOLN
0.6250 mg | Freq: Once | INTRAMUSCULAR | Status: DC | PRN
Start: 2023-10-25 — End: 2023-10-26

## 2023-10-25 MED ORDER — DEXMEDETOMIDINE HCL IN NACL 80 MCG/20ML IV SOLN
INTRAVENOUS | Status: DC | PRN
  Administered 2023-10-25: 4 ug via INTRAVENOUS

## 2023-10-25 MED ORDER — FLUORESCEIN SODIUM 10 % IV SOLN
INTRAVENOUS | Status: DC | PRN
  Administered 2023-10-25: 50 mg via INTRAVENOUS

## 2023-10-25 MED ORDER — KETOROLAC TROMETHAMINE 30 MG/ML IJ SOLN
INTRAMUSCULAR | Status: AC
  Filled 2023-10-25: qty 1

## 2023-10-25 MED ORDER — ACETAMINOPHEN 500 MG PO TABS
ORAL_TABLET | ORAL | Status: AC
Start: 2023-10-25 — End: ?
  Filled 2023-10-25: qty 2

## 2023-10-25 MED ORDER — MIDAZOLAM HCL 2 MG/2ML IJ SOLN
INTRAMUSCULAR | Status: AC
  Filled 2023-10-25: qty 2

## 2023-10-25 MED ORDER — FENTANYL CITRATE (PF) 100 MCG/2ML IJ SOLN: 25.0000 ug | INTRAMUSCULAR | Status: DC | PRN

## 2023-10-25 MED ORDER — DEXAMETHASONE SODIUM PHOSPHATE 10 MG/ML IJ SOLN
INTRAMUSCULAR | Status: AC
  Filled 2023-10-25: qty 1

## 2023-10-25 MED ORDER — SODIUM CHLORIDE 0.9 % IR SOLN
Status: DC | PRN
  Administered 2023-10-25: 6000 mL via INTRAVESICAL
  Administered 2023-10-25: 3000 mL via INTRAVESICAL
  Administered 2023-10-25 (×2): 6000 mL via INTRAVESICAL

## 2023-10-25 MED ORDER — EPHEDRINE 5 MG/ML INJ
INTRAVENOUS | Status: AC
  Filled 2023-10-25: qty 15

## 2023-10-25 MED ORDER — SODIUM CHLORIDE 0.9 % IV SOLN
INTRAVENOUS | Status: AC
Start: 2023-10-25 — End: ?
  Filled 2023-10-25: qty 100

## 2023-10-25 SURGICAL SUPPLY — 32 items
BAG URINE DRAIN 2000ML AR STRL (UROLOGICAL SUPPLIES) IMPLANT
BAG URO CATCHER STRL LF (MISCELLANEOUS) ×2 IMPLANT
CATH FOLEY 2WAY SLVR 5CC 20FR (CATHETERS) IMPLANT
CATH URETERAL DUAL LUMEN 10F (MISCELLANEOUS) IMPLANT
CATH URETL OPEN 5X70 (CATHETERS) ×2 IMPLANT
CLOTH BEACON ORANGE TIMEOUT ST (SAFETY) ×2 IMPLANT
DRAPE FOOT SWITCH (DRAPES) ×2 IMPLANT
ELECT REM PT RETURN 15FT ADLT (MISCELLANEOUS) IMPLANT
EVACUATOR MICROVAS BLADDER (UROLOGICAL SUPPLIES) IMPLANT
EXTRACTOR STONE NITINOL NGAGE (UROLOGICAL SUPPLIES) IMPLANT
GLOVE BIO SURGEON STRL SZ8 (GLOVE) IMPLANT
GLOVE SS BIOGEL STRL SZ 8 (GLOVE) ×2 IMPLANT
GOWN STRL REUS W/ TWL XL LVL3 (GOWN DISPOSABLE) ×2 IMPLANT
GOWN STRL REUS W/TWL XL LVL3 (GOWN DISPOSABLE) ×2 IMPLANT
GUIDEWIRE ANG ZIPWIRE 035X150 (WIRE) IMPLANT
GUIDEWIRE STR DUAL SENSOR (WIRE) IMPLANT
HOLDER FOLEY CATH W/STRAP (MISCELLANEOUS) IMPLANT
IV NS IRRIG 3000ML ARTHROMATIC (IV SOLUTION) ×4 IMPLANT
KIT TURNOVER CYSTO (KITS) ×2 IMPLANT
LOOP CUT BIPOLAR 24F LRG (ELECTROSURGICAL) IMPLANT
MANIFOLD NEPTUNE II (INSTRUMENTS) ×2 IMPLANT
NS IRRIG 1000ML POUR BTL (IV SOLUTION) IMPLANT
PACK CYSTO (CUSTOM PROCEDURE TRAY) ×2 IMPLANT
PAD PREP 24X48 CUFFED NSTRL (MISCELLANEOUS) ×2 IMPLANT
SHEATH NAVIGATOR HD 11/13X36 (SHEATH) IMPLANT
SLEEVE SCD COMPRESS KNEE MED (STOCKING) ×2 IMPLANT
STENT URET 6FRX22 CONTOUR (STENTS) IMPLANT
STENT URET 6FRX24 CONTOUR (STENTS) IMPLANT
TUBING CONNECTING 10 (TUBING) ×2 IMPLANT
TUBING UROLOGY SET (TUBING) ×2 IMPLANT
WATER STERILE IRR 3000ML UROMA (IV SOLUTION) IMPLANT
WATER STERILE IRR 500ML POUR (IV SOLUTION) ×2 IMPLANT

## 2023-10-25 NOTE — Anesthesia Postprocedure Evaluation (Signed)
Anesthesia Post Note  Patient: Henrry Othman  Procedure(s) Performed: CYSTOSCOPY WITH RETROGRADE PYELOGRAM, URETEROSCOPY AND STENT PLACEMENT (Bilateral: Renal) TRANSURETHRAL RESECTION OF BLADDER TUMOR (TURBT) (Bladder)     Patient location during evaluation: PACU Anesthesia Type: General Level of consciousness: awake and alert Pain management: pain level controlled Vital Signs Assessment: post-procedure vital signs reviewed and stable Respiratory status: spontaneous breathing, nonlabored ventilation, respiratory function stable and patient connected to nasal cannula oxygen Cardiovascular status: blood pressure returned to baseline and stable Postop Assessment: no apparent nausea or vomiting Anesthetic complications: no   No notable events documented.  Last Vitals:  Vitals:   10/25/23 1800 10/25/23 1815  BP: 138/69   Pulse: 62 62  Resp: 14 15  Temp:    SpO2: 97% 96%    Last Pain:  Vitals:   10/25/23 1815  TempSrc:   PainSc: 0-No pain                 Trevor Iha

## 2023-10-25 NOTE — Discharge Instructions (Addendum)
MAY REMOVE CATHETER ON THURSDAY MORNING as long as urine is not bloody.     [facility] [phone] Post Ureteroscopy With or Without Stent Instructions  Definitions:  Ureter: The duct that transports urine from the kidney to the bladder. Stent:   A plastic hollow tube that is placed into the ureter, from the kidney to the bladder to prevent the ureter from swelling shut.  GENERAL INSTRUCTIONS:  Despite the fact that no skin incisions were used, the area around the ureter and bladder is raw and irritated. The stent is a foreign body which will further irritate the bladder wall. This irritation is manifested by increased frequency of urination, both day and night, and by an increase in the urge to urinate. In some, the urge to urinate is present almost always. Sometimes the urge is strong enough that you may not be able to stop yourself from urinating. The only real cure is to remove the stent and then give time for the bladder wall to heal which can't be done until the danger of the ureter swelling shut has passed, which varies.  You may see some blood in your urine while the stent is in place and a few days afterwards. Do not be alarmed, even if the urine was clear for a while. Get off your feet and drink lots of fluids until clearing occurs. If you start to pass clots or don't improve, call us.  DIET: You may return to your normal diet immediately. Because of the raw surface of your bladder, alcohol, spicy foods, acid type foods and drinks with caffeine may cause irritation or frequency and should be used in moderation. To keep your urine flowing freely and to avoid constipation, drink plenty of fluids during the day ( 8-10 glasses ). Tip: Avoid cranberry juice because it is very acidic.  ACTIVITY: Your physical activity doesn't need to be restricted. However, if you are very active, you may see some blood in your urine. We suggest that you reduce your activity under  these circumstances until the bleeding has stopped.  BOWELS: It is important to keep your bowels regular during the postoperative period. Straining with bowel movements can cause bleeding. A bowel movement every other day is reasonable. Use a mild laxative if needed, such as Milk of Magnesia 2-3 tablespoons, or 2 Dulcolax tablets. Call if you continue to have problems. If you have been taking narcotics for pain, before, during or after your surgery, you may be constipated. Take a laxative if necessary.   MEDICATION: You should resume your pre-surgery medications unless told not to. In addition you will often be given an antibiotic to prevent infection. These should be taken as prescribed until the bottles are finished unless you are having an unusual reaction to one of the drugs.  PROBLEMS YOU SHOULD REPORT TO [place]: Fevers over 100.5 Fahrenheit. Heavy bleeding, or clots ( See above notes about blood in urine ). Inability to urinate. Drug reactions ( hives, rash, nausea, vomiting, diarrhea ). Severe burning or pain with urination that is not improving.  FOLLOW-UP: You will need a follow-up appointment to monitor your progress. Call for this appointment at the number listed above. Usually the first appointment will be about three to fourteen days after your surgery.     Post Anesthesia Home Care Instructions  Activity: Get plenty of rest for the remainder of the day. A responsible individual must stay with you for 24 hours following the procedure.  For the next 24 hours,  DO NOT: -Drive a car -Advertising copywriter -Drink alcoholic beverages -Take any medication unless instructed by your physician -Make any legal decisions or sign important papers.  Meals: Start with liquid foods such as gelatin or soup. Progress to regular foods as tolerated. Avoid greasy, spicy, heavy foods. If nausea and/or vomiting occur, drink only clear liquids until the nausea and/or vomiting subsides. Call your  physician if vomiting continues.  Special Instructions/Symptoms: Your throat may feel dry or sore from the anesthesia or the breathing tube placed in your throat during surgery. If this causes discomfort, gargle with warm salt water. The discomfort should disappear within 24 hours.   No acetaminophen/Tylenol until after 7:30 pm today if needed.  No ibuprofen, Advil, Aleve, Motrin, ketorolac, meloxicam, naproxen, or other NSAIDS until after 11:20 pm today if needed.

## 2023-10-25 NOTE — Transfer of Care (Signed)
Immediate Anesthesia Transfer of Care Note  Patient: Dominic Ray  Procedure(s) Performed: Procedure(s) (LRB): CYSTOSCOPY WITH RETROGRADE PYELOGRAM, URETEROSCOPY AND STENT PLACEMENT (Bilateral) TRANSURETHRAL RESECTION OF BLADDER TUMOR (TURBT) (N/A)  Patient Location: PACU  Anesthesia Type: General  Level of Consciousness: awake, oriented, sedated and patient cooperative  Airway & Oxygen Therapy: Patient Spontanous Breathing and Patient connected to face mask oxygen  Post-op Assessment: Report given to PACU RN and Post -op Vital signs reviewed and stable  Post vital signs: Reviewed and stable  Complications: No apparent anesthesia complications Last Vitals:  Vitals Value Taken Time  BP 142/71 10/25/23 1737  Temp 36.4 C 10/25/23 1737  Pulse 55 10/25/23 1738  Resp 17 10/25/23 1738  SpO2 98 % 10/25/23 1738  Vitals shown include unfiled device data.  Last Pain:  Vitals:   10/25/23 1321  TempSrc: Oral  PainSc: 0-No pain      Patients Stated Pain Goal: 7 (10/25/23 1321)  Complications: No notable events documented.

## 2023-10-25 NOTE — Progress Notes (Signed)
Patient called in with a few questions. He wanted to verify when he needed to stop all oral intake before surgery. He also wanted to know if he could play a light game of pickle ball this morning. I told him he could drink clear liquids until 12 noon today. I also told him it would be okay to play a little pickle ball this morning, just to be sure to shower well afterwards and not over exert himself.

## 2023-10-25 NOTE — Anesthesia Preprocedure Evaluation (Addendum)
Anesthesia Evaluation  Patient identified by MRN, date of birth, ID band Patient awake    Reviewed: Allergy & Precautions, NPO status , Patient's Chart, lab work & pertinent test results  History of Anesthesia Complications Negative for: history of anesthetic complications  Airway Mallampati: III  TM Distance: >3 FB Neck ROM: Full    Dental no notable dental hx. (+) Dental Advisory Given, Teeth Intact   Pulmonary asthma , pneumonia   Pulmonary exam normal breath sounds clear to auscultation       Cardiovascular Exercise Tolerance: Good hypertension, Pt. on medications and Pt. on home beta blockers + CAD and + CABG  + Valvular Problems/Murmurs AI and AS  Rhythm:Regular Rate:Normal  Echo 07/2023  1. Left ventricular ejection fraction, by estimation, is 55 to 60%. The left ventricle has normal function. The left ventricle has no regional wall motion abnormalities. Left ventricular diastolic parameters are consistent with Grade II diastolic dysfunction (pseudonormalization).   2. Right ventricular systolic function is normal. The right ventricular size is normal.   3. The mitral valve is normal in structure. Trivial mitral valve regurgitation. No evidence of mitral stenosis.   4. The aortic valve is calcified. There is mild calcification of the aortic valve. There is mild thickening of the aortic valve. Aortic valve regurgitation is mild. Mild aortic valve stenosis. Aortic valve area, by VTI measures 1.58 cm. Aortic valve mean gradient measures 14.0 mmHg.   5. The inferior vena cava is normal in size with greater than 50% respiratory variability, suggesting right atrial pressure of 3 mmHg.   Comparison(s): Echocardiogram done 05/09/17 showed an EF of 50-55%.       Neuro/Psych negative neurological ROS  negative psych ROS   GI/Hepatic negative GI ROS, Neg liver ROS,,,  Endo/Other  negative endocrine ROS    Renal/GU negative Renal  ROS    Bladder mass     Musculoskeletal negative musculoskeletal ROS (+)    Abdominal   Peds  Hematology negative hematology ROS (+)   Anesthesia Other Findings   Reproductive/Obstetrics                              Anesthesia Physical Anesthesia Plan  ASA: 3  Anesthesia Plan: General   Post-op Pain Management: Tylenol PO (pre-op)* and Minimal or no pain anticipated   Induction: Intravenous  PONV Risk Score and Plan: 2 and Treatment may vary due to age or medical condition, Ondansetron and Dexamethasone  Airway Management Planned: Oral ETT and LMA  Additional Equipment: None  Intra-op Plan:   Post-operative Plan: Extubation in OR  Informed Consent: I have reviewed the patients History and Physical, chart, labs and discussed the procedure including the risks, benefits and alternatives for the proposed anesthesia with the patient or authorized representative who has indicated his/her understanding and acceptance.     Dental advisory given  Plan Discussed with: CRNA  Anesthesia Plan Comments:          Anesthesia Quick Evaluation

## 2023-10-25 NOTE — Op Note (Signed)
OPERATIVE NOTE   Patient Name: Dominic Ray  MRN: [id number]   Date of Procedure: 10/25/2023   Preoperative diagnosis:  Bladder cancer  Postoperative diagnosis:  Same  Procedure:  Cystoscopy, bilateral retrograde with intraoperative interpretation, transurethral resection of bladder tumor large occupying trigone and right bladder neck;  placement of bilateral ureteral stents  Attending: Concepcion Living, MD  Anesthesia: general  Estimated blood loss: minimal  Fluids: Per anesthesia record  Drains: Bilateral dj stents (6x22 on left; 6x 24 on right--tethers removed)  Specimens: bladder tumor chips  Antibiotics: rocephin  Findings: Surprising amount of regrowth of tumor and short interval of time involving mostly the right trigone; both ureters were identified during resection and were stented as above.  On examination under anesthesia patient has noticeable right bladder wall thickening without 3-dimensional mass  Indications:  Bladder cancer  Description of Procedure:  The patient was brought to the operating room where he was correctly identified and underwent satisfactory induction of general anesthesia.  Patient was subsequently positioned in the modified dorsolithotomy position and prepped and draped in the usual sterile fashion.   Preprocedural timeout was performed.  Cystoscopy was subsequently performed with a 22 [place] panendoscope.  The anterior urethra appeared relatively normal.  Patient has an enlarged prostate with lateral lobe enlargement as well as an intravesical component.  He previously underwent resection of much of the intravesical component so that I could access tumor around his bladder neck.  This area appeared to be healing in routine fashion.  The bladder was subsequently entered and carefully inspected.  There was noted to be prominent regrowth of tumor approximately 3 to 4 cm occupying the right trigone as well as some studding of papillary  change on the posterior bladder wall as well.  I could not identify either ureteral opening.  At this time a 46 [place] continuous flow resectoscope was placed with the bipolar apparatus and resection loop was utilized for resection and cautery.  I began where there was obvious regrowth of tumor on the right trigone and aggressively resected the right trigone down well into detrusor extending the resection partially up the right lateral wall.  In the process of this resection I opened up what appeared to be the right ureteral opening which opened up like a nipple fairly deep within the detrusor.  I could not see any E flux.  I marked the site for further defecation.  I then completely resected all the residual tumor in that region and used the bipolar cautery judiciously for hemostasis.  I then resected the left trigone which had some heaped up mucosa as well I resected it also unroofing and identifying the left ureteral opening which also appeared almost like a nipple sticking up from the detrusor.  I used the [employer] and removed all of the resected tissue.  The area of resection was well over 5 cm again involving the both trigone's and right lateral wall.  After all the specimen was removed and hemostasis was obtained the patient was administered 1 mL of 10% sodium fluorescein and after approximately 10 minutes I could see diet coming from the left transected ureter.  The resectoscope was removed and I then placed the cystoscope.  I subsequently was able to cannulate the left ureteral opening with a angle-tip Glidewire and under fluoroscopic control this was easily advanced up to the renal collecting system.  I then placed a open-ended catheter up to the proximal ureter removed the wire and obtained a retrograde injection  to outline the renal collecting system.  There was some mild dilation.  I then placed a sensor wire through the open-ended catheter and subsequently under endoscopic and fluoroscopic  control placed a 6 x 22 cm double-J stent on the left side with good position obtained proximally and distally.  At this time I turned my attention to the right ureter.  I never could see any dye coming from the right ureter.  I was fairly confident of the area of the ureter and subsequently probing it with a angle-tip Glidewire was able to get the Glidewire to pass and pass all the way up into the renal collecting system.  Similarly I again placed an open-ended catheter up to the proximal ureter and obtained a retrograde injection outlining the renal collecting system there is minimal dilation on the side.  A sensor wire was then placed through the open-ended catheter and under endoscopic and fluoroscopic control I placed a 6 x 24 cm double-J stent on the right.  The tether was removed we will remove from both stents.  At this time the bladder was again inspected hemostasis appeared to be adequate and the bladder was drained.  Examination under anesthesia demonstrated a large prostate approximately 60 to 70 g without nodules or induration.  There was noted to be some bladder wall thickening on the right distally without 3-dimensional mass.  At this time a Foley catheter was placed with pink return this irrigated nicely and cleared up as would be expected.  The patient tolerated the procedure well.  There were no apparent complications he was subsequently taken to the recovery in satisfactory condition  Complications: None  Condition: Stable, extubated, transferred to PACU  Plan: Fu 1 week to review path

## 2023-10-25 NOTE — Anesthesia Procedure Notes (Signed)
Procedure Name: LMA Insertion Date/Time: 10/25/2023 3:39 PM  Performed by: Earmon Phoenix, CRNAPre-anesthesia Checklist: Patient identified, Emergency Drugs available, Suction available, Patient being monitored and Timeout performed Patient Re-evaluated:Patient Re-evaluated prior to induction Oxygen Delivery Method: Circle system utilized Preoxygenation: Pre-oxygenation with 100% oxygen Induction Type: IV induction Ventilation: Mask ventilation without difficulty LMA: LMA inserted LMA Size: 5.0 Number of attempts: 1 Placement Confirmation: positive ETCO2 and breath sounds checked- equal and bilateral Tube secured with: Tape Dental Injury: Teeth and Oropharynx as per pre-operative assessment

## 2023-10-25 NOTE — Interval H&P Note (Signed)
History and Physical Interval Note:  10/25/2023 2:35 PM  Dominic Ray  has presented today for surgery, with the diagnosis of bladder cancer, bil hydronephrosis.  The various methods of treatment have been discussed with the patient and family. After consideration of risks, benefits and other options for treatment, the patient has consented to  Procedure(s): CYSTOSCOPY WITH RETROGRADE PYELOGRAM, URETEROSCOPY AND STENT PLACEMENT (Bilateral) TRANSURETHRAL RESECTION OF BLADDER TUMOR (TURBT) (N/A) as a surgical intervention.  The patient's history has been reviewed, patient examined, no change in status, stable for surgery.  I have reviewed the patient's chart and labs.  Questions were answered to the patient's satisfaction.     [name]

## 2023-10-26 ENCOUNTER — Other Ambulatory Visit: Payer: Self-pay | Admitting: Urology

## 2023-10-26 ENCOUNTER — Encounter (HOSPITAL_BASED_OUTPATIENT_CLINIC_OR_DEPARTMENT_OTHER): Payer: Self-pay | Admitting: Urology

## 2023-10-27 ENCOUNTER — Other Ambulatory Visit: Payer: Self-pay | Admitting: Urology

## 2023-10-27 DIAGNOSIS — C678 Malignant neoplasm of overlapping sites of bladder: Secondary | ICD-10-CM

## 2023-10-27 LAB — SURGICAL PATHOLOGY

## 2023-11-02 ENCOUNTER — Encounter: Payer: Self-pay | Admitting: Urology

## 2023-11-02 ENCOUNTER — Ambulatory Visit: Payer: Medicare HMO | Admitting: Urology

## 2023-11-02 VITALS — BP 137/78 | HR 61

## 2023-11-02 DIAGNOSIS — C678 Malignant neoplasm of overlapping sites of bladder: Secondary | ICD-10-CM | POA: Diagnosis not present

## 2023-11-02 LAB — URINALYSIS, ROUTINE W REFLEX MICROSCOPIC
Bilirubin, UA: NEGATIVE
Glucose, UA: NEGATIVE
Ketones, UA: NEGATIVE
Nitrite, UA: NEGATIVE
Specific Gravity, UA: 1.02 (ref 1.005–1.030)
Urobilinogen, Ur: 0.2 mg/dL (ref 0.2–1.0)
pH, UA: 5.5 (ref 5.0–7.5)

## 2023-11-02 LAB — MICROSCOPIC EXAMINATION: RBC, Urine: >30 /[HPF] — AB (ref 0–2)

## 2023-11-02 NOTE — Progress Notes (Signed)
Assessment: 1. Malignant neoplasm of overlapping sites of bladder [facility])     Plan: Today I had a long and detailed discussion with the patient and his family regarding his high risk nonmuscle invasive bladder cancer.  I told him that there is obviously some concern that he may have a urothelial cancer phenotype that is more aggressive and may not respond as well to BCG.  Nonetheless the absence of muscle invasion after extensive resection of this point is somewhat reassuring.  Patient is tolerating the stents very well.  Patient is scheduled for PET CT to evaluate an interval enlarged left pelvic lymph node.  If this is negative for malignancy we will proceed with BCG induction in 3 to 4 weeks.  Patient is clear he would still like to pursue bladder preservation.  Chief Complaint: Bladder cancer  HPI: Dominic Ray is a 67 y.o. male who presents for continued evaluation of bladder cancer. See my note 06/14/2023 at time of initial visit for detailed history. Presented with worsening LUTS and gross hematuria.   PSA 06/2023=  3.1 Prostate volume (US)= 71ml   Heme eval-- Office cysto demonstrated bladder tumor CT heme protocol 06/20/2023-bladder wall thickening concerning for bladder cancer without evidence of metastatic disease or upper tract abnormalities.   Bladder cancer summary: Status post TURBT 07/18/2023-pathology high-grade T1-muscularis propria present but uninvolved--large area of tumor involvement trigone, right floor/lateral wall and around bladder neck. RUO not identified.  Left UO seen   Status post repeat TURBT 08/22/2023-pathology high-grade TA/question focal T1.  Muscularis propria present but uninvolved.  Comment was made of focal inverted growth pattern which could suggest potentially an aggressive phenotype. RUO again not visualized.  Left UO transected and wide open with clear efflux   Renal US 09/2023-- mild-mod bilateral hydro and marked bladder wall thickening Lab:   09/2023-- CR 0.94   CT-hematuria protocol 10/07/2023-mild to moderate bilateral hydro to the level of the bladder with contrast seen entering bladder.  Given the bilateral hydro and concern for worsening obstruction with potential inflammation with BCG treatment the patient was taken back to the operating room on 10/25/2023. At that time I was able to resect both trigone areas and identify both ureteral openings which were subsequently stented.  Unfortunately and concerning there was evidence of significant regrowth of tumor in the prior interval involving the right trigone and bladder neck region.  Pathology demonstrates persistent T1 TCC-high-grade.  Portions of the above documentation were copied from a prior visit for review purposes only.  Allergies: Allergies  Allergen Reactions   [name] [Lidocaine] Rash    Anything ending in (-caine) per patient     PMH: Past Medical History:  Diagnosis Date   Asthma    AS A CHILD   Basal cell carcinoma of nasal tip 08/24/2012   Bladder cancer (HCC) 2024   Follows w/ Dr. Marcha Solders, Urology.   Coronary artery disease    a. Abnl stress test -> s/p CABGx4 in 04/2017 with LIMA-LAD, SVG-diag, SVG-AM, SVG-PDA.   Heart murmur    Hyperlipidemia    Follows w/ Dr. Jens Som, cardiology.   Hypertension    Follows w/ Dr. Jens Som, cardiology.   Male hypogonadism 08/24/2012   Onychomycosis 08/24/2012   Pneumonia 1967    PSH: Past Surgical History:  Procedure Laterality Date   CARDIOVASCULAR STRESS TEST  04/2017   COLONOSCOPY  12/06/2022   one descending colon polyp   CORONARY ARTERY BYPASS GRAFT N/A 05/09/2017   Procedure: CORONARY ARTERY BYPASS GRAFTING times  four using left internal mammary artery and right leg saphenous vein  ;  Surgeon: Alleen Borne, MD;  Location: [facility] OR;  Service: Open Heart Surgery;  Laterality: N/A;   CYSTOSCOPY WITH RETROGRADE PYELOGRAM, URETEROSCOPY AND STENT PLACEMENT Bilateral 10/25/2023   Procedure:  CYSTOSCOPY WITH RETROGRADE PYELOGRAM, URETEROSCOPY AND STENT PLACEMENT;  Surgeon: [name], MD;  Location: [facility];  Service: Urology;  Laterality: Bilateral;   LEFT HEART CATH AND CORONARY ANGIOGRAPHY N/A 05/08/2017   Procedure: Left Heart Cath and Coronary Angiography;  Surgeon: Lennette Bihari, MD;  Location: [facility] INVASIVE CV LAB;  Service: Cardiovascular;  Laterality: N/A;   TEE WITHOUT CARDIOVERSION N/A 05/09/2017   Procedure: TRANSESOPHAGEAL ECHOCARDIOGRAM (TEE);  Surgeon: Alleen Borne, MD;  Location: [facility] OR;  Service: Open Heart Surgery;  Laterality: N/A;   TONSILLECTOMY  1968   TRANSURETHRAL RESECTION OF BLADDER TUMOR Bilateral 07/18/2023   Procedure: TRANSURETHRAL RESECTION OF BLADDER TUMOR (TURBT);  Surgeon: [name], MD;  Location: [facility];  Service: Urology;  Laterality: Bilateral;   TRANSURETHRAL RESECTION OF BLADDER TUMOR Bilateral 08/22/2023   Procedure: TRANSURETHRAL RESECTION OF BLADDER TUMOR (TURBT);  Surgeon: [name], MD;  Location: [facility];  Service: Urology;  Laterality: Bilateral;   TRANSURETHRAL RESECTION OF BLADDER TUMOR N/A 10/25/2023   Procedure: TRANSURETHRAL RESECTION OF BLADDER TUMOR (TURBT);  Surgeon: [name], MD;  Location: [facility];  Service: Urology;  Laterality: N/A;    SH: Social History   Tobacco Use   Smoking status: Never   Smokeless tobacco: Never  Vaping Use   Vaping status: Never Used  Substance Use Topics   Alcohol use: Yes    Comment: Occasional   Drug use: No    ROS: Constitutional:  Negative for fever, chills, weight loss CV: Negative for chest pain, previous MI, hypertension Respiratory:  Negative for shortness of breath, wheezing, sleep apnea, frequent cough GI:  Negative for nausea, vomiting, bloody stool, GERD  PE: Vitals:   11/02/23 0938  BP: 137/78  Pulse: 61    GENERAL APPEARANCE:  Well appearing, well developed, well nourished, NAD    Results: UA  shows significant hematuria pyuria and few bacteria.  Yeast also noted.  Will obtain culture.

## 2023-11-04 LAB — URINE CULTURE: Organism ID, Bacteria: NO GROWTH

## 2023-11-07 ENCOUNTER — Ambulatory Visit (HOSPITAL_COMMUNITY)
Admission: RE | Admit: 2023-11-07 | Discharge: 2023-11-07 | Disposition: A | Discharge status: Home / Self Care | Payer: Medicare HMO | Source: Ambulatory Visit | Attending: Urology | Admitting: Urology

## 2023-11-07 DIAGNOSIS — C678 Malignant neoplasm of overlapping sites of bladder: Secondary | ICD-10-CM | POA: Insufficient documentation

## 2023-11-07 LAB — GLUCOSE, CAPILLARY: Glucose-Capillary: 103 mg/dL — ABNORMAL HIGH (ref 70–99)

## 2023-11-07 MED ORDER — FLUDEOXYGLUCOSE F - 18 (FDG) INJECTION
103.0000 | Freq: Once | INTRAVENOUS | Status: AC
  Administered 2023-11-07: 10 via INTRAVENOUS

## 2023-11-09 ENCOUNTER — Encounter (HOSPITAL_COMMUNITY): Payer: Medicare HMO

## 2023-11-23 ENCOUNTER — Ambulatory Visit (INDEPENDENT_AMBULATORY_CARE_PROVIDER_SITE_OTHER): Payer: Medicare HMO | Admitting: Urology

## 2023-11-23 ENCOUNTER — Encounter: Payer: Self-pay | Admitting: Urology

## 2023-11-23 VITALS — BP 120/75 | HR 73 | Ht 68.0 in | Wt 205.0 lb

## 2023-11-23 DIAGNOSIS — C678 Malignant neoplasm of overlapping sites of bladder: Secondary | ICD-10-CM

## 2023-11-23 LAB — MICROSCOPIC EXAMINATION: RBC, Urine: >30 /[HPF] — AB (ref 0–2)

## 2023-11-23 LAB — URINALYSIS, ROUTINE W REFLEX MICROSCOPIC
Bilirubin, UA: NEGATIVE
Glucose, UA: NEGATIVE
Ketones, UA: NEGATIVE
Nitrite, UA: NEGATIVE
Specific Gravity, UA: 1.025 (ref 1.005–1.030)
Urobilinogen, Ur: 0.2 mg/dL (ref 0.2–1.0)
pH, UA: 6 (ref 5.0–7.5)

## 2023-11-23 NOTE — Progress Notes (Signed)
 Assessment: 1. Malignant neoplasm of overlapping sites of bladder [facility])     Plan: Today I discussed with the patient the difficulty in interpretation of his urine especially given his bilateral ureteral stents.  It has now just been under 4 weeks since his last resection.  I told him that I would feel more comfortable to give him 2 more weeks to allow adequate bladder healing and then we will begin the BCG at that time.  Chief Complaint: Bladder cancer  HPI: Dominic Ray is a 68 y.o. male who presents for continued evaluation of high risk nonmuscle invasive bladder cancer also with bilateral ureteral stents.  Since his last visit he underwent a PET scan due to an interval enlargement of a left pelvic lymph node.  PET scan was negative indicating likely reactive changes.  See multiple prior notes.  Patient continues to do quite well.  Today's urinalysis does show greater than 30 RBCs/hpf and low-level pyuria.  He has not seen any frank gross hematuria but did see some pink-tinged urine a few days ago.   Portions of the above documentation were copied from a prior visit for review purposes only.  Allergies: Allergies  Allergen Reactions   [name] [Lidocaine ] Rash    Anything ending in (-caine) per patient     PMH: Past Medical History:  Diagnosis Date   Asthma    AS A CHILD   Basal cell carcinoma of nasal tip 08/24/2012   Bladder cancer (HCC) 2024   Follows w/ Dr. Layman Hurst, Urology.   Coronary artery disease    a. Abnl stress test -> s/p CABGx4 in 04/2017 with LIMA-LAD, SVG-diag, SVG-AM, SVG-PDA.   Heart murmur    Hyperlipidemia    Follows w/ Dr. Pietro, cardiology.   Hypertension    Follows w/ Dr. Pietro, cardiology.   Male hypogonadism 08/24/2012   Onychomycosis 08/24/2012   Pneumonia 1967    PSH: Past Surgical History:  Procedure Laterality Date   CARDIOVASCULAR STRESS TEST  04/2017   COLONOSCOPY  12/06/2022   one descending colon polyp   CORONARY ARTERY  BYPASS GRAFT N/A 05/09/2017   Procedure: CORONARY ARTERY BYPASS GRAFTING times four using left internal mammary artery and right leg saphenous vein  ;  Surgeon: Lucas Dorise POUR, MD;  Location: [facility] OR;  Service: Open Heart Surgery;  Laterality: N/A;   CYSTOSCOPY WITH RETROGRADE PYELOGRAM, URETEROSCOPY AND STENT PLACEMENT Bilateral 10/25/2023   Procedure: CYSTOSCOPY WITH RETROGRADE PYELOGRAM, URETEROSCOPY AND STENT PLACEMENT;  Surgeon: Hurst Layman BROCKS, MD;  Location: [facility];  Service: Urology;  Laterality: Bilateral;   LEFT HEART CATH AND CORONARY ANGIOGRAPHY N/A 05/08/2017   Procedure: Left Heart Cath and Coronary Angiography;  Surgeon: Burnard Debby LABOR, MD;  Location: [facility] INVASIVE CV LAB;  Service: Cardiovascular;  Laterality: N/A;   TEE WITHOUT CARDIOVERSION N/A 05/09/2017   Procedure: TRANSESOPHAGEAL ECHOCARDIOGRAM (TEE);  Surgeon: Lucas Dorise POUR, MD;  Location: [facility] OR;  Service: Open Heart Surgery;  Laterality: N/A;   TONSILLECTOMY  1968   TRANSURETHRAL RESECTION OF BLADDER TUMOR Bilateral 07/18/2023   Procedure: TRANSURETHRAL RESECTION OF BLADDER TUMOR (TURBT);  Surgeon: Hurst Layman BROCKS, MD;  Location: [facility];  Service: Urology;  Laterality: Bilateral;   TRANSURETHRAL RESECTION OF BLADDER TUMOR Bilateral 08/22/2023   Procedure: TRANSURETHRAL RESECTION OF BLADDER TUMOR (TURBT);  Surgeon: Hurst Layman BROCKS, MD;  Location: [facility];  Service: Urology;  Laterality: Bilateral;   TRANSURETHRAL RESECTION OF BLADDER TUMOR N/A 10/25/2023   Procedure: TRANSURETHRAL RESECTION OF BLADDER  TUMOR (TURBT);  Surgeon: Shona Layman BROCKS, MD;  Location: [facility];  Service: Urology;  Laterality: N/A;    SH: Social History   Tobacco Use   Smoking status: Never   Smokeless tobacco: Never  Vaping Use   Vaping status: Never Used  Substance Use Topics   Alcohol use: Yes    Comment: Occasional   Drug use: No    ROS: Constitutional:  Negative for fever, chills, weight  loss CV: Negative for chest pain, previous MI, hypertension Respiratory:  Negative for shortness of breath, wheezing, sleep apnea, frequent cough GI:  Negative for nausea, vomiting, bloody stool, GERD  PE: BP 120/75   Pulse 73   Ht 5' 8 (1.727 m)   Wt 205 lb (93 kg)   BMI 31.17 kg/m  GENERAL APPEARANCE:  Well appearing, well developed, well nourished, NAD    Results: Urinalysis shows significant microscopic hematuria and low-level pyuria

## 2023-12-06 ENCOUNTER — Encounter: Payer: Self-pay | Admitting: Urology

## 2023-12-06 ENCOUNTER — Ambulatory Visit (INDEPENDENT_AMBULATORY_CARE_PROVIDER_SITE_OTHER): Payer: Medicare HMO | Admitting: Urology

## 2023-12-06 DIAGNOSIS — C678 Malignant neoplasm of overlapping sites of bladder: Secondary | ICD-10-CM | POA: Diagnosis not present

## 2023-12-06 MED ORDER — BCG LIVE 50 MG IS SUSR
3.2400 mL | Freq: Once | INTRAVESICAL | Status: AC
Start: 2023-12-06 — End: 2023-12-06
  Administered 2023-12-06: 81 mg via INTRAVESICAL

## 2023-12-06 NOTE — Progress Notes (Signed)
 BCG Bladder Instillation  BCG # 1  Due to Bladder Cancer patient is present today for a BCG treatment. Patient was cleaned and prepped in a sterile fashion with betadine. A 14FR catheter was inserted, urine return was noted 25ml, urine was yellow in color.  50ml of reconstituted BCG was instilled into the bladder. The catheter was then removed. Patient tolerated well, no complications were noted  Performed by: Savvas Roper N., CMA(AAMA)

## 2023-12-06 NOTE — Progress Notes (Signed)
Assessment: 1. Malignant neoplasm of overlapping sites of bladder (HCC)     Plan: BCG #1/6 today FU 1 week  Chief Complaint: Bladder cancer  HPI: Dominic Ray is a 68 y.o. male who presents for continued evaluation of high risk nonmuscle invasive bladder cancer.  He is now approximately 6 weeks out following his most recent repeat TURBT and placement of bilateral ureteral stents.  He is here today to initiate BCG induction.  He reports doing very well.  No recent gross hematuria.  He still has significant microhematuria not unsurprising given his bilateral ureteral stents. No additional GU or constitutional complaints   Portions of the above documentation were copied from a prior visit for review purposes only.  Allergies: Allergies  Allergen Reactions   [name] [Lidocaine] Rash    Anything ending in (-caine) per patient     PMH: Past Medical History:  Diagnosis Date   Asthma    AS A CHILD   Basal cell carcinoma of nasal tip 08/24/2012   Bladder cancer (HCC) 2024   Follows w/ Dr. Marcha Solders, Urology.   Coronary artery disease    a. Abnl stress test -> s/p CABGx4 in 04/2017 with LIMA-LAD, SVG-diag, SVG-AM, SVG-PDA.   Heart murmur    Hyperlipidemia    Follows w/ Dr. Jens Som, cardiology.   Hypertension    Follows w/ Dr. Jens Som, cardiology.   Male hypogonadism 08/24/2012   Onychomycosis 08/24/2012   Pneumonia 1967    PSH: Past Surgical History:  Procedure Laterality Date   CARDIOVASCULAR STRESS TEST  04/2017   COLONOSCOPY  12/06/2022   one descending colon polyp   CORONARY ARTERY BYPASS GRAFT N/A 05/09/2017   Procedure: CORONARY ARTERY BYPASS GRAFTING times four using left internal mammary artery and right leg saphenous vein  ;  Surgeon: Alleen Borne, MD;  Location: [facility] OR;  Service: Open Heart Surgery;  Laterality: N/A;   CYSTOSCOPY WITH RETROGRADE PYELOGRAM, URETEROSCOPY AND STENT PLACEMENT Bilateral 10/25/2023   Procedure: CYSTOSCOPY WITH RETROGRADE  PYELOGRAM, URETEROSCOPY AND STENT PLACEMENT;  Surgeon: [name], MD;  Location: [facility];  Service: Urology;  Laterality: Bilateral;   LEFT HEART CATH AND CORONARY ANGIOGRAPHY N/A 05/08/2017   Procedure: Left Heart Cath and Coronary Angiography;  Surgeon: Lennette Bihari, MD;  Location: [facility] INVASIVE CV LAB;  Service: Cardiovascular;  Laterality: N/A;   TEE WITHOUT CARDIOVERSION N/A 05/09/2017   Procedure: TRANSESOPHAGEAL ECHOCARDIOGRAM (TEE);  Surgeon: Alleen Borne, MD;  Location: [facility] OR;  Service: Open Heart Surgery;  Laterality: N/A;   TONSILLECTOMY  1968   TRANSURETHRAL RESECTION OF BLADDER TUMOR Bilateral 07/18/2023   Procedure: TRANSURETHRAL RESECTION OF BLADDER TUMOR (TURBT);  Surgeon: [name], MD;  Location: [facility];  Service: Urology;  Laterality: Bilateral;   TRANSURETHRAL RESECTION OF BLADDER TUMOR Bilateral 08/22/2023   Procedure: TRANSURETHRAL RESECTION OF BLADDER TUMOR (TURBT);  Surgeon: [name], MD;  Location: [facility];  Service: Urology;  Laterality: Bilateral;   TRANSURETHRAL RESECTION OF BLADDER TUMOR N/A 10/25/2023   Procedure: TRANSURETHRAL RESECTION OF BLADDER TUMOR (TURBT);  Surgeon: [name], MD;  Location: [facility];  Service: Urology;  Laterality: N/A;    SH: Social History   Tobacco Use   Smoking status: Never   Smokeless tobacco: Never  Vaping Use   Vaping status: Never Used  Substance Use Topics   Alcohol use: Yes    Comment: Occasional   Drug use: No    ROS: Constitutional:  Negative for  fever, chills, weight loss CV: Negative for chest pain, previous MI, hypertension Respiratory:  Negative for shortness of breath, wheezing, sleep apnea, frequent cough GI:  Negative for nausea, vomiting, bloody stool, GERD  PE: There were no vitals taken for this visit. GENERAL APPEARANCE:  Well appearing, well developed, well nourished, NAD    Results: UA shows low-level pyuria and significant  microscopic hematuria without evidence of infection

## 2023-12-11 ENCOUNTER — Telehealth: Payer: Self-pay

## 2023-12-11 ENCOUNTER — Encounter: Payer: Self-pay | Admitting: Urology

## 2023-12-11 LAB — URINALYSIS, ROUTINE W REFLEX MICROSCOPIC
Bilirubin, UA: NEGATIVE
Glucose, UA: NEGATIVE
Ketones, UA: NEGATIVE
Nitrite, UA: NEGATIVE
Specific Gravity, UA: 1.015 (ref 1.005–1.030)
Urobilinogen, Ur: 0.2 mg/dL (ref 0.2–1.0)
pH, UA: 8 — ABNORMAL HIGH (ref 5.0–7.5)

## 2023-12-11 LAB — MICROSCOPIC EXAMINATION: RBC, Urine: >30 /[HPF] — AB (ref 0–2)

## 2023-12-11 NOTE — Telephone Encounter (Signed)
Spoke with Dr. Retta Diones in Dr. Scharlene Gloss absence. Pt states that the cough and scratchy throat are better and he feels much better than yesterday. Suggested pt get a flu test as it is prevalent in the community right now. Increase fluids and let us know if symptoms do not improve.

## 2023-12-12 ENCOUNTER — Ambulatory Visit (INDEPENDENT_AMBULATORY_CARE_PROVIDER_SITE_OTHER): Payer: Medicare HMO | Admitting: Physician Assistant

## 2023-12-12 ENCOUNTER — Encounter: Payer: Self-pay | Admitting: Physician Assistant

## 2023-12-12 VITALS — BP 132/85 | HR 80 | Temp 99.9°F | Ht 68.0 in | Wt 203.0 lb

## 2023-12-12 DIAGNOSIS — J101 Influenza due to other identified influenza virus with other respiratory manifestations: Secondary | ICD-10-CM

## 2023-12-12 LAB — POCT INFLUENZA A/B
Influenza A, POC: POSITIVE — AB
Influenza B, POC: NEGATIVE

## 2023-12-12 LAB — POC COVID19 BINAXNOW: SARS Coronavirus 2 Ag: NEGATIVE

## 2023-12-12 MED ORDER — OSELTAMIVIR PHOSPHATE 75 MG PO CAPS: 75.0000 mg | ORAL_CAPSULE | Freq: Two times a day (BID) | ORAL | 0 refills | Status: DC

## 2023-12-12 NOTE — Patient Instructions (Signed)

## 2023-12-12 NOTE — Progress Notes (Signed)
Acute Office Visit  Subjective:     Patient ID: Dominic Ray, male    DOB: [birth year], 68 y.o.   MRN: [id number]  Chief Complaint  Patient presents with   Cough    Flu like symptoms    HPI Patient is in today for 3 days of flu like symptoms. No known sick contact. He is in treatment for bladder cancer. He has had chills, body aches, cough, fever. He has been taking tylenol cold and flu and OTC cough drops and syrup. He does feel a little better today than he has been.   He was negative for covid with his home test.   Pt has not been vaccinated for covid or flu for this season.   .. Active Ambulatory Problems    Diagnosis Date Noted   Hypertension 08/24/2012   Onychomycosis 08/24/2012   Male hypogonadism 08/24/2012   Basal cell carcinoma of nasal tip 08/24/2012   Hyperlipidemia 09/05/2012   Subacromial bursitis 10/04/2012   Discogenic low back pain 04/17/2013   CAD in native artery 05/08/2017   S/P CABG x 4 05/09/2017   Annual physical exam 11/22/2017   Obstructive uropathy 11/22/2017   Breast mass in male 11/22/2017   Skin lesion 12/02/2019   Peroneal tendinitis, left 10/11/2022   Prostatitis 05/23/2023   Hyperglycemia 05/24/2023   Bladder mass 06/27/2023   Bladder cancer (HCC) 08/22/2023   Resolved Ambulatory Problems    Diagnosis Date Noted   Atypical chest pain 08/24/2012   Preventive measure 09/05/2012   Cough 06/13/2016   Abnormal stress test    Past Medical History:  Diagnosis Date   Asthma    Coronary artery disease    Heart murmur    Pneumonia 1967     ROS See HPI.      Objective:    BP 132/85   Pulse 80   Temp 99.9 F (37.7 C) (Oral)   Ht 5\' 8"  (1.727 m)   Wt 203 lb (92.1 kg)   SpO2 99%   BMI 30.87 kg/m  BP Readings from Last 3 Encounters:  12/12/23 132/85  11/23/23 120/75  11/02/23 137/78   Wt Readings from Last 3 Encounters:  12/12/23 203 lb (92.1 kg)  11/23/23 205 lb (93 kg)  10/25/23 200 lb 6.4 oz (90.9 kg)    . Results  for orders placed or performed in visit on 12/12/23  POC COVID-19   Collection Time: 12/12/23  1:02 PM  Result Value Ref Range   SARS Coronavirus 2 Ag Negative Negative  POCT Influenza A/B   Collection Time: 12/12/23  1:03 PM  Result Value Ref Range   Influenza A, POC Positive (A) Negative   Influenza B, POC Negative Negative      Physical Exam Constitutional:      Appearance: Normal appearance.  HENT:     Head: Normocephalic.     Right Ear: Tympanic membrane, ear canal and external ear normal. There is no impacted cerumen.     Left Ear: Tympanic membrane, ear canal and external ear normal. There is no impacted cerumen.     Nose: Congestion present.     Mouth/Throat:     Mouth: Mucous membranes are moist.     Pharynx: Posterior oropharyngeal erythema present. No oropharyngeal exudate.  Eyes:     Conjunctiva/sclera: Conjunctivae normal.  Cardiovascular:     Rate and Rhythm: Normal rate.  Pulmonary:     Effort: Pulmonary effort is normal.     Breath sounds: Normal breath sounds.  Musculoskeletal:     Cervical back: Normal range of motion and neck supple. No tenderness.     Right lower leg: No edema.     Left lower leg: No edema.  Lymphadenopathy:     Cervical: Cervical adenopathy present.  Neurological:     General: No focal deficit present.     Mental Status: He is alert and oriented to person, place, and time.  Psychiatric:        Mood and Affect: Mood normal.          Assessment & Plan:  Marland KitchenMarland KitchenKazuma was seen today for cough.  Diagnoses and all orders for this visit:  Influenza A -     oseltamivir (TAMIFLU) 75 MG capsule; Take 1 capsule (75 mg total) by mouth 2 (two) times daily.   Positive for flu A Negative for covid and flu B Vitals stable Day 3 of symptoms and still in window, can start tamiflu Continue symptomatic care-declined any cough medication Rest and hydrate Consider vitamin D 1000 units, vitamin D 1000 units, zinc Follow up as needed or  if symptoms persist or worsen   Tandy Gaw, PA-C

## 2023-12-14 ENCOUNTER — Ambulatory Visit: Payer: Medicare HMO | Admitting: Urology

## 2023-12-21 ENCOUNTER — Ambulatory Visit (INDEPENDENT_AMBULATORY_CARE_PROVIDER_SITE_OTHER): Payer: Medicare HMO | Admitting: Urology

## 2023-12-21 ENCOUNTER — Encounter: Payer: Self-pay | Admitting: Urology

## 2023-12-21 DIAGNOSIS — C678 Malignant neoplasm of overlapping sites of bladder: Secondary | ICD-10-CM

## 2023-12-21 MED ORDER — BCG LIVE 50 MG IS SUSR
3.2400 mL | Freq: Once | INTRAVESICAL | Status: AC
  Administered 2023-12-21: 81 mg via INTRAVESICAL

## 2023-12-21 NOTE — Progress Notes (Signed)
 BCG Bladder Instillation  BCG # 2  Due to Bladder Cancer patient is present today for a BCG treatment. Patient was cleaned and prepped in a sterile fashion with betadine. A 14FR catheter was inserted, urine return was noted 50ml, urine was dark yellow in color.  50ml of reconstituted BCG was instilled into the bladder. The catheter was then removed. Patient tolerated well, no complications were noted  Performed by: Veryl Winemiller N., CMA(AAMA)

## 2023-12-21 NOTE — Progress Notes (Signed)
 Assessment: 1. Malignant neoplasm of overlapping sites of bladder (HCC)     Plan: BCG No. 2 of 6 induction administered today.  Follow-up 1 week as scheduled for next instillation.  Chief Complaint: Bladder cancer  HPI: Dominic Ray is a 68 y.o. male who presents for continued evaluation of high risk nonmuscle invasive bladder cancer.  Patient is currently undergoing induction BCG.  He tolerated his initial treatment well however last week at the time of his second treatment he developed the flu which was confirmed by testing.  He got over that fairly quickly and is currently back to normal.   Portions of the above documentation were copied from a prior visit for review purposes only.  Allergies: Allergies  Allergen Reactions   [name] [Lidocaine ] Rash    Anything ending in (-caine) per patient     PMH: Past Medical History:  Diagnosis Date   Asthma    AS A CHILD   Basal cell carcinoma of nasal tip 08/24/2012   Bladder cancer (HCC) 2024   Follows w/ Dr. Layman Hurst, Urology.   Coronary artery disease    a. Abnl stress test -> s/p CABGx4 in 04/2017 with LIMA-LAD, SVG-diag, SVG-AM, SVG-PDA.   Heart murmur    Hyperlipidemia    Follows w/ Dr. Pietro, cardiology.   Hypertension    Follows w/ Dr. Pietro, cardiology.   Male hypogonadism 08/24/2012   Onychomycosis 08/24/2012   Pneumonia 1967    PSH: Past Surgical History:  Procedure Laterality Date   CARDIOVASCULAR STRESS TEST  04/2017   COLONOSCOPY  12/06/2022   one descending colon polyp   CORONARY ARTERY BYPASS GRAFT N/A 05/09/2017   Procedure: CORONARY ARTERY BYPASS GRAFTING times four using left internal mammary artery and right leg saphenous vein  ;  Surgeon: Lucas Dorise POUR, MD;  Location: [facility] OR;  Service: Open Heart Surgery;  Laterality: N/A;   CYSTOSCOPY WITH RETROGRADE PYELOGRAM, URETEROSCOPY AND STENT PLACEMENT Bilateral 10/25/2023   Procedure: CYSTOSCOPY WITH RETROGRADE PYELOGRAM, URETEROSCOPY AND STENT  PLACEMENT;  Surgeon: Hurst Layman BROCKS, MD;  Location: [facility];  Service: Urology;  Laterality: Bilateral;   LEFT HEART CATH AND CORONARY ANGIOGRAPHY N/A 05/08/2017   Procedure: Left Heart Cath and Coronary Angiography;  Surgeon: Burnard Debby LABOR, MD;  Location: [facility] INVASIVE CV LAB;  Service: Cardiovascular;  Laterality: N/A;   TEE WITHOUT CARDIOVERSION N/A 05/09/2017   Procedure: TRANSESOPHAGEAL ECHOCARDIOGRAM (TEE);  Surgeon: Lucas Dorise POUR, MD;  Location[facility];  Service: Open Heart Surgery;  Laterality: N/A;   TONSILLECTOMY  1968   TRANSURETHRAL RESECTION OF BLADDER TUMOR Bilateral 07/18/2023   Procedure: TRANSURETHRAL RESECTION OF BLADDER TUMOR (TURBT);  Surgeon: Hurst Layman BROCKS, MD;  Location: [facility];  Service: Urology;  Laterality: Bilateral;   TRANSURETHRAL RESECTION OF BLADDER TUMOR Bilateral 08/22/2023   Procedure: TRANSURETHRAL RESECTION OF BLADDER TUMOR (TURBT);  Surgeon: Hurst Layman BROCKS, MD;  Location: [facility];  Service: Urology;  Laterality: Bilateral;   TRANSURETHRAL RESECTION OF BLADDER TUMOR N/A 10/25/2023   Procedure: TRANSURETHRAL RESECTION OF BLADDER TUMOR (TURBT);  Surgeon: Hurst Layman BROCKS, MD;  Location: [facility];  Service: Urology;  Laterality: N/A;    SH: Social History   Tobacco Use   Smoking status: Never   Smokeless tobacco: Never  Vaping Use   Vaping status: Never Used  Substance Use Topics   Alcohol use: Yes    Comment: Occasional   Drug use: No    ROS: Constitutional:  Negative for fever, chills,  weight loss CV: Negative for chest pain, previous MI, hypertension Respiratory:  Negative for shortness of breath, wheezing, sleep apnea, frequent cough GI:  Negative for nausea, vomiting, bloody stool, GERD  PE: There were no vitals taken for this visit. GENERAL APPEARANCE:  Well appearing, well developed, well nourished, NAD    Results: Urinalysis today shows low-level hematuria and pyuria otherwise negative

## 2023-12-22 LAB — URINALYSIS, ROUTINE W REFLEX MICROSCOPIC
Bilirubin, UA: NEGATIVE
Glucose, UA: NEGATIVE
Ketones, UA: NEGATIVE
Nitrite, UA: NEGATIVE
Specific Gravity, UA: 1.01 (ref 1.005–1.030)
Urobilinogen, Ur: 0.2 mg/dL (ref 0.2–1.0)
pH, UA: 6.5 (ref 5.0–7.5)

## 2023-12-22 LAB — MICROSCOPIC EXAMINATION

## 2023-12-28 ENCOUNTER — Ambulatory Visit: Payer: Medicare HMO | Admitting: Urology

## 2023-12-29 ENCOUNTER — Encounter: Payer: Self-pay | Admitting: Urology

## 2023-12-29 ENCOUNTER — Ambulatory Visit: Payer: Medicare HMO | Admitting: Urology

## 2023-12-29 DIAGNOSIS — C678 Malignant neoplasm of overlapping sites of bladder: Secondary | ICD-10-CM | POA: Diagnosis not present

## 2023-12-29 LAB — URINALYSIS, ROUTINE W REFLEX MICROSCOPIC
Bilirubin, UA: NEGATIVE
Glucose, UA: NEGATIVE
Ketones, UA: NEGATIVE
Nitrite, UA: NEGATIVE
Specific Gravity, UA: 1.015 (ref 1.005–1.030)
Urobilinogen, Ur: 0.2 mg/dL (ref 0.2–1.0)
pH, UA: 7 (ref 5.0–7.5)

## 2023-12-29 LAB — MICROSCOPIC EXAMINATION: RBC, Urine: >30 /[HPF] — ABNORMAL HIGH (ref 0–2)

## 2023-12-29 MED ORDER — BCG LIVE 50 MG IS SUSR
3.2400 mL | Freq: Once | INTRAVESICAL | Status: AC
  Administered 2023-12-29: 81 mg via INTRAVESICAL

## 2023-12-29 NOTE — Progress Notes (Signed)
Assessment: 1. Malignant neoplasm of overlapping sites of bladder (HCC)     Plan: BCG No. 3/6 administered today.  Follow-up 1 week for next instillation  Chief Complaint: Bladder cancer  HPI: Dominic Ray is a 68 y.o. male who presents for continued evaluation of high risk nonmuscle invasive bladder cancer.  Patient currently undergoing induction BCG.  He has tolerated his initial treatments very well.  No current complaints.  No recent gross hematuria.   Portions of the above documentation were copied from a prior visit for review purposes only.  Allergies: Allergies  Allergen Reactions   [name] [Lidocaine] Rash    Anything ending in (-caine) per patient     PMH: Past Medical History:  Diagnosis Date   Asthma    AS A CHILD   Basal cell carcinoma of nasal tip 08/24/2012   Bladder cancer (HCC) 2024   Follows w/ Dr. Marcha Solders, Urology.   Coronary artery disease    a. Abnl stress test -> s/p CABGx4 in 04/2017 with LIMA-LAD, SVG-diag, SVG-AM, SVG-PDA.   Heart murmur    Hyperlipidemia    Follows w/ Dr. Jens Som, cardiology.   Hypertension    Follows w/ Dr. Jens Som, cardiology.   Male hypogonadism 08/24/2012   Onychomycosis 08/24/2012   Pneumonia 1967    PSH: Past Surgical History:  Procedure Laterality Date   CARDIOVASCULAR STRESS TEST  04/2017   COLONOSCOPY  12/06/2022   one descending colon polyp   CORONARY ARTERY BYPASS GRAFT N/A 05/09/2017   Procedure: CORONARY ARTERY BYPASS GRAFTING times four using left internal mammary artery and right leg saphenous vein  ;  Surgeon: Alleen Borne, MD;  Location: [facility] OR;  Service: Open Heart Surgery;  Laterality: N/A;   CYSTOSCOPY WITH RETROGRADE PYELOGRAM, URETEROSCOPY AND STENT PLACEMENT Bilateral 10/25/2023   Procedure: CYSTOSCOPY WITH RETROGRADE PYELOGRAM, URETEROSCOPY AND STENT PLACEMENT;  Surgeon: [name], MD;  Location: [facility];  Service: Urology;  Laterality: Bilateral;   LEFT HEART  CATH AND CORONARY ANGIOGRAPHY N/A 05/08/2017   Procedure: Left Heart Cath and Coronary Angiography;  Surgeon: Lennette Bihari, MD;  Location: [facility] INVASIVE CV LAB;  Service: Cardiovascular;  Laterality: N/A;   TEE WITHOUT CARDIOVERSION N/A 05/09/2017   Procedure: TRANSESOPHAGEAL ECHOCARDIOGRAM (TEE);  Surgeon: Alleen Borne, MD;  Location: [facility] OR;  Service: Open Heart Surgery;  Laterality: N/A;   TONSILLECTOMY  1968   TRANSURETHRAL RESECTION OF BLADDER TUMOR Bilateral 07/18/2023   Procedure: TRANSURETHRAL RESECTION OF BLADDER TUMOR (TURBT);  Surgeon: [name], MD;  Location: [facility];  Service: Urology;  Laterality: Bilateral;   TRANSURETHRAL RESECTION OF BLADDER TUMOR Bilateral 08/22/2023   Procedure: TRANSURETHRAL RESECTION OF BLADDER TUMOR (TURBT);  Surgeon: [name], MD;  Location: [facility];  Service: Urology;  Laterality: Bilateral;   TRANSURETHRAL RESECTION OF BLADDER TUMOR N/A 10/25/2023   Procedure: TRANSURETHRAL RESECTION OF BLADDER TUMOR (TURBT);  Surgeon: [name], MD;  Location: [facility];  Service: Urology;  Laterality: N/A;    SH: Social History   Tobacco Use   Smoking status: Never   Smokeless tobacco: Never  Vaping Use   Vaping status: Never Used  Substance Use Topics   Alcohol use: Yes    Comment: Occasional   Drug use: No    ROS: Constitutional:  Negative for fever, chills, weight loss CV: Negative for chest pain, previous MI, hypertension Respiratory:  Negative for shortness of breath, wheezing, sleep apnea, frequent cough GI:  Negative for nausea,  vomiting, bloody stool, GERD   PE: GENERAL APPEARANCE:  Well appearing, well developed, well nourished, NAD    Results: Urinalysis demonstrates low-level pyuria and microscopic hematuria.

## 2023-12-29 NOTE — Progress Notes (Signed)
BCG Bladder Instillation  BCG # 3  Due to Bladder Cancer patient is present today for a BCG treatment. Patient was cleaned and prepped in a sterile fashion with betadine. A 14FR catheter was inserted, urine return was noted 50ml, urine was yellow in color.  50ml of reconstituted BCG was instilled into the bladder. The catheter was then removed. Patient tolerated well, no complications were noted  Performed by: Everlean Bucher N., CMA(AAMA)

## 2023-12-29 NOTE — Addendum Note (Signed)
Addended by: Carolin Coy on: 12/29/2023 12:59 PM   Modules accepted: Orders

## 2024-01-02 ENCOUNTER — Encounter: Payer: Self-pay | Admitting: Urology

## 2024-01-04 ENCOUNTER — Ambulatory Visit: Payer: Medicare HMO | Admitting: Urology

## 2024-01-04 ENCOUNTER — Encounter: Payer: Self-pay | Admitting: Urology

## 2024-01-04 DIAGNOSIS — C678 Malignant neoplasm of overlapping sites of bladder: Secondary | ICD-10-CM | POA: Diagnosis not present

## 2024-01-04 LAB — URINALYSIS, ROUTINE W REFLEX MICROSCOPIC
Bilirubin, UA: NEGATIVE
Glucose, UA: NEGATIVE
Ketones, UA: NEGATIVE
Nitrite, UA: NEGATIVE
Specific Gravity, UA: 1.02 (ref 1.005–1.030)
Urobilinogen, Ur: 0.2 mg/dL (ref 0.2–1.0)
pH, UA: 6.5 (ref 5.0–7.5)

## 2024-01-04 LAB — MICROSCOPIC EXAMINATION: RBC, Urine: >30 /[HPF] — ABNORMAL HIGH (ref 0–2)

## 2024-01-04 MED ORDER — BCG LIVE 50 MG IS SUSR
3.2400 mL | Freq: Once | INTRAVESICAL | Status: AC
  Administered 2024-01-04: 81 mg via INTRAVESICAL

## 2024-01-04 NOTE — Progress Notes (Signed)
Assessment: 1. Malignant neoplasm of overlapping sites of bladder [facility])     Plan: BCG No. 4/6 administered today Follow-up as scheduled 1 week for next instillation  Chief Complaint: Bladder Cancer  HPI: Dominic Ray is a 68 y.o. male who presents for continued evaluation of high risk nonmuscle invasive bladder cancer.  Patient is currently undergoing induction BCG adjuvant immunotherapy.  He has received 3 prior installations and is tolerating them quite well.  He reports that after his last installation for several hours he had some general malaise chills but no fever.  He has had no recent gross hematuria.   Portions of the above documentation were copied from a prior visit for review purposes only.  Allergies: Allergies  Allergen Reactions   [name] [Lidocaine] Rash    Anything ending in (-caine) per patient     PMH: Past Medical History:  Diagnosis Date   Asthma    AS A CHILD   Basal cell carcinoma of nasal tip 08/24/2012   Bladder cancer (HCC) 2024   Follows w/ Dr. Marcha Solders, Urology.   Coronary artery disease    a. Abnl stress test -> s/p CABGx4 in 04/2017 with LIMA-LAD, SVG-diag, SVG-AM, SVG-PDA.   Heart murmur    Hyperlipidemia    Follows w/ Dr. Jens Som, cardiology.   Hypertension    Follows w/ Dr. Jens Som, cardiology.   Male hypogonadism 08/24/2012   Onychomycosis 08/24/2012   Pneumonia 1967    PSH: Past Surgical History:  Procedure Laterality Date   CARDIOVASCULAR STRESS TEST  04/2017   COLONOSCOPY  12/06/2022   one descending colon polyp   CORONARY ARTERY BYPASS GRAFT N/A 05/09/2017   Procedure: CORONARY ARTERY BYPASS GRAFTING times four using left internal mammary artery and right leg saphenous vein  ;  Surgeon: Alleen Borne, MD;  Location: [facility] OR;  Service: Open Heart Surgery;  Laterality: N/A;   CYSTOSCOPY WITH RETROGRADE PYELOGRAM, URETEROSCOPY AND STENT PLACEMENT Bilateral 10/25/2023   Procedure: CYSTOSCOPY WITH RETROGRADE PYELOGRAM,  URETEROSCOPY AND STENT PLACEMENT;  Surgeon: [name], MD;  Location: [facility];  Service: Urology;  Laterality: Bilateral;   LEFT HEART CATH AND CORONARY ANGIOGRAPHY N/A 05/08/2017   Procedure: Left Heart Cath and Coronary Angiography;  Surgeon: Lennette Bihari, MD;  Location: [facility] INVASIVE CV LAB;  Service: Cardiovascular;  Laterality: N/A;   TEE WITHOUT CARDIOVERSION N/A 05/09/2017   Procedure: TRANSESOPHAGEAL ECHOCARDIOGRAM (TEE);  Surgeon: Alleen Borne, MD;  Location: [facility] OR;  Service: Open Heart Surgery;  Laterality: N/A;   TONSILLECTOMY  1968   TRANSURETHRAL RESECTION OF BLADDER TUMOR Bilateral 07/18/2023   Procedure: TRANSURETHRAL RESECTION OF BLADDER TUMOR (TURBT);  Surgeon: [name], MD;  Location: [facility];  Service: Urology;  Laterality: Bilateral;   TRANSURETHRAL RESECTION OF BLADDER TUMOR Bilateral 08/22/2023   Procedure: TRANSURETHRAL RESECTION OF BLADDER TUMOR (TURBT);  Surgeon: [name], MD;  Location: [facility];  Service: Urology;  Laterality: Bilateral;   TRANSURETHRAL RESECTION OF BLADDER TUMOR N/A 10/25/2023   Procedure: TRANSURETHRAL RESECTION OF BLADDER TUMOR (TURBT);  Surgeon: [name], MD;  Location: [facility];  Service: Urology;  Laterality: N/A;    SH: Social History   Tobacco Use   Smoking status: Never   Smokeless tobacco: Never  Vaping Use   Vaping status: Never Used  Substance Use Topics   Alcohol use: Yes    Comment: Occasional   Drug use: No    ROS: Constitutional:  Negative for fever, chills,  weight loss CV: Negative for chest pain, previous MI, hypertension Respiratory:  Negative for shortness of breath, wheezing, sleep apnea, frequent cough GI:  Negative for nausea, vomiting, bloody stool, GERD  PE: GENERAL APPEARANCE:  Well appearing, well developed, well nourished, NAD    Results: UA shows microscopic hematuria and low-level pyuria without evidence of infection.

## 2024-01-04 NOTE — Progress Notes (Signed)
 BCG Bladder Instillation  BCG # 4  Due to Bladder Cancer patient is present today for a BCG treatment. Patient was cleaned and prepped in a sterile fashion with betadine. A 14FR catheter was inserted, urine return was noted 25ml, urine was yellow in color.  50ml of reconstituted BCG was instilled into the bladder. The catheter was then removed. Patient tolerated well, no complications were noted  Performed by: Nachman Sundt N., CMA(AAMA)

## 2024-01-04 NOTE — Addendum Note (Signed)
Addended by: Carolin Coy on: 01/04/2024 02:53 PM   Modules accepted: Orders

## 2024-01-05 ENCOUNTER — Other Ambulatory Visit: Payer: Self-pay | Admitting: Sports Medicine

## 2024-01-05 DIAGNOSIS — I10 Essential (primary) hypertension: Secondary | ICD-10-CM

## 2024-01-11 ENCOUNTER — Ambulatory Visit: Payer: Medicare HMO | Admitting: Urology

## 2024-01-11 ENCOUNTER — Encounter: Payer: Self-pay | Admitting: Urology

## 2024-01-11 DIAGNOSIS — C678 Malignant neoplasm of overlapping sites of bladder: Secondary | ICD-10-CM

## 2024-01-11 LAB — MICROSCOPIC EXAMINATION: RBC, Urine: >30 /[HPF] — AB (ref 0–2)

## 2024-01-11 LAB — URINALYSIS, ROUTINE W REFLEX MICROSCOPIC
Bilirubin, UA: NEGATIVE
Glucose, UA: NEGATIVE
Ketones, UA: NEGATIVE
Nitrite, UA: NEGATIVE
Specific Gravity, UA: 1.01 (ref 1.005–1.030)
Urobilinogen, Ur: 0.2 mg/dL (ref 0.2–1.0)
pH, UA: 6.5 (ref 5.0–7.5)

## 2024-01-11 MED ORDER — BCG LIVE 50 MG IS SUSR
3.2400 mL | Freq: Once | INTRAVESICAL | Status: AC
  Administered 2024-01-11: 81 mg via INTRAVESICAL

## 2024-01-11 NOTE — Progress Notes (Signed)
 Assessment: 1. Malignant neoplasm of overlapping sites of bladder (HCC)     Plan: BCG #5/6 today FU 1 week  Chief Complaint: Bladder cancer  HPI: Dominic Ray is a 68 y.o. male who presents for continued evaluation of high risk nonmuscle invasive bladder cancer.  Patient is currently undergoing induction BCG.  He has completed 4 prior installations without difficulty.  Had short period of chills without fever at time of last 2 instillations.  No GH.   Portions of the above documentation were copied from a prior visit for review purposes only.  Allergies: Allergies  Allergen Reactions   [name] [Lidocaine] Rash    Anything ending in (-caine) per patient     PMH: Past Medical History:  Diagnosis Date   Asthma    AS A CHILD   Basal cell carcinoma of nasal tip 08/24/2012   Bladder cancer (HCC) 2024   Follows w/ Dr. Marcha Solders, Urology.   Coronary artery disease    a. Abnl stress test -> s/p CABGx4 in 04/2017 with LIMA-LAD, SVG-diag, SVG-AM, SVG-PDA.   Heart murmur    Hyperlipidemia    Follows w/ Dr. Jens Som, cardiology.   Hypertension    Follows w/ Dr. Jens Som, cardiology.   Male hypogonadism 08/24/2012   Onychomycosis 08/24/2012   Pneumonia 1967    PSH: Past Surgical History:  Procedure Laterality Date   CARDIOVASCULAR STRESS TEST  04/2017   COLONOSCOPY  12/06/2022   one descending colon polyp   CORONARY ARTERY BYPASS GRAFT N/A 05/09/2017   Procedure: CORONARY ARTERY BYPASS GRAFTING times four using left internal mammary artery and right leg saphenous vein  ;  Surgeon: Alleen Borne, MD;  Location: [facility] OR;  Service: Open Heart Surgery;  Laterality: N/A;   CYSTOSCOPY WITH RETROGRADE PYELOGRAM, URETEROSCOPY AND STENT PLACEMENT Bilateral 10/25/2023   Procedure: CYSTOSCOPY WITH RETROGRADE PYELOGRAM, URETEROSCOPY AND STENT PLACEMENT;  Surgeon: [name], MD;  Location: [facility];  Service: Urology;  Laterality: Bilateral;   LEFT HEART CATH  AND CORONARY ANGIOGRAPHY N/A 05/08/2017   Procedure: Left Heart Cath and Coronary Angiography;  Surgeon: Lennette Bihari, MD;  Location: [facility] INVASIVE CV LAB;  Service: Cardiovascular;  Laterality: N/A;   TEE WITHOUT CARDIOVERSION N/A 05/09/2017   Procedure: TRANSESOPHAGEAL ECHOCARDIOGRAM (TEE);  Surgeon: Alleen Borne, MD;  Location: [facility] OR;  Service: Open Heart Surgery;  Laterality: N/A;   TONSILLECTOMY  1968   TRANSURETHRAL RESECTION OF BLADDER TUMOR Bilateral 07/18/2023   Procedure: TRANSURETHRAL RESECTION OF BLADDER TUMOR (TURBT);  Surgeon: [name], MD;  Location: [facility];  Service: Urology;  Laterality: Bilateral;   TRANSURETHRAL RESECTION OF BLADDER TUMOR Bilateral 08/22/2023   Procedure: TRANSURETHRAL RESECTION OF BLADDER TUMOR (TURBT);  Surgeon: [name], MD;  Location: [facility];  Service: Urology;  Laterality: Bilateral;   TRANSURETHRAL RESECTION OF BLADDER TUMOR N/A 10/25/2023   Procedure: TRANSURETHRAL RESECTION OF BLADDER TUMOR (TURBT);  Surgeon: [name], MD;  Location: [facility];  Service: Urology;  Laterality: N/A;    SH: Social History   Tobacco Use   Smoking status: Never   Smokeless tobacco: Never  Vaping Use   Vaping status: Never Used  Substance Use Topics   Alcohol use: Yes    Comment: Occasional   Drug use: No    ROS: Constitutional:  Negative for fever, chills, weight loss CV: Negative for chest pain, previous MI, hypertension Respiratory:  Negative for shortness of breath, wheezing, sleep apnea, frequent cough GI:  Negative for nausea, vomiting, bloody stool, GERD  PE: GENERAL APPEARANCE:  Well appearing, well developed, well nourished, NAD

## 2024-01-11 NOTE — Progress Notes (Signed)
 BCG Bladder Instillation  BCG # 5  Due to Bladder Cancer patient is present today for a BCG treatment. Patient was cleaned and prepped in a sterile fashion with betadine. A 14FR catheter was inserted, urine return was noted 50ml, urine was yellow in color.  50ml of reconstituted BCG was instilled into the bladder. The catheter was then removed. Patient tolerated well, no complications were noted  Performed by: Da Authement N., CMA(AAMA)

## 2024-01-11 NOTE — Addendum Note (Signed)
 Addended by: Carolin Coy on: 01/11/2024 12:56 PM   Modules accepted: Orders

## 2024-01-18 ENCOUNTER — Ambulatory Visit: Payer: Medicare HMO | Admitting: Urology

## 2024-01-18 ENCOUNTER — Ambulatory Visit: Payer: Self-pay | Admitting: Urology

## 2024-01-18 ENCOUNTER — Encounter: Payer: Self-pay | Admitting: Urology

## 2024-01-18 DIAGNOSIS — C678 Malignant neoplasm of overlapping sites of bladder: Secondary | ICD-10-CM

## 2024-01-18 LAB — MICROSCOPIC EXAMINATION: RBC, Urine: >30 /HPF — AB (ref 0–2)

## 2024-01-18 LAB — URINALYSIS, ROUTINE W REFLEX MICROSCOPIC
Bilirubin, UA: NEGATIVE
Glucose, UA: NEGATIVE
Ketones, UA: NEGATIVE
Nitrite, UA: NEGATIVE
Specific Gravity, UA: 1.015 (ref 1.005–1.030)
Urobilinogen, Ur: 0.2 mg/dL (ref 0.2–1.0)
pH, UA: 6 (ref 5.0–7.5)

## 2024-01-18 MED ORDER — BCG LIVE 50 MG IS SUSR
3.2400 mL | Freq: Once | INTRAVESICAL | Status: AC
  Administered 2024-01-18: 81 mg via INTRAVESICAL

## 2024-01-18 NOTE — Progress Notes (Signed)
 Assessment: 1. Malignant neoplasm of overlapping sites of bladder (HCC)     Plan: BCG No. 6 administered today.  Today also sat down with the patient and his sister and discussed plans for ongoing care and follow-up.  I have recommended complete disease assessment evaluation in 6 weeks with cystoscopy bladder biopsy possible TURBT and stent exchange.  Rationale as well as nature procedure reviewed in detail today including potential adverse events and complications.  Patient agrees to proceed.  Chief Complaint: Bladder cancer  HPI: Dominic Ray is a 68 y.o. male who presents for continued evaluation of high risk nonmuscle invasive bladder cancer.  Patient is here today for his final 6-week instillation of BCG.  He has tolerated his previous 5 installations very well.  No current complaints.  No recent gross hematuria.   Portions of the above documentation were copied from a prior visit for review purposes only.  Allergies: Allergies  Allergen Reactions   [name] [Lidocaine] Rash    Anything ending in (-caine) per patient     PMH: Past Medical History:  Diagnosis Date   Asthma    AS A CHILD   Basal cell carcinoma of nasal tip 08/24/2012   Bladder cancer (HCC) 2024   Follows w/ Dr. Marcha Solders, Urology.   Coronary artery disease    a. Abnl stress test -> s/p CABGx4 in 04/2017 with LIMA-LAD, SVG-diag, SVG-AM, SVG-PDA.   Heart murmur    Hyperlipidemia    Follows w/ Dr. Jens Som, cardiology.   Hypertension    Follows w/ Dr. Jens Som, cardiology.   Male hypogonadism 08/24/2012   Onychomycosis 08/24/2012   Pneumonia 1967    PSH: Past Surgical History:  Procedure Laterality Date   CARDIOVASCULAR STRESS TEST  04/2017   COLONOSCOPY  12/06/2022   one descending colon polyp   CORONARY ARTERY BYPASS GRAFT N/A 05/09/2017   Procedure: CORONARY ARTERY BYPASS GRAFTING times four using left internal mammary artery and right leg saphenous vein  ;  Surgeon: Alleen Borne, MD;   Location: [facility] OR;  Service: Open Heart Surgery;  Laterality: N/A;   CYSTOSCOPY WITH RETROGRADE PYELOGRAM, URETEROSCOPY AND STENT PLACEMENT Bilateral 10/25/2023   Procedure: CYSTOSCOPY WITH RETROGRADE PYELOGRAM, URETEROSCOPY AND STENT PLACEMENT;  Surgeon: [name], MD;  Location: [facility];  Service: Urology;  Laterality: Bilateral;   LEFT HEART CATH AND CORONARY ANGIOGRAPHY N/A 05/08/2017   Procedure: Left Heart Cath and Coronary Angiography;  Surgeon: Lennette Bihari, MD;  Location: [facility] INVASIVE CV LAB;  Service: Cardiovascular;  Laterality: N/A;   TEE WITHOUT CARDIOVERSION N/A 05/09/2017   Procedure: TRANSESOPHAGEAL ECHOCARDIOGRAM (TEE);  Surgeon: Alleen Borne, MD;  Location: [facility] OR;  Service: Open Heart Surgery;  Laterality: N/A;   TONSILLECTOMY  1968   TRANSURETHRAL RESECTION OF BLADDER TUMOR Bilateral 07/18/2023   Procedure: TRANSURETHRAL RESECTION OF BLADDER TUMOR (TURBT);  Surgeon: [name], MD;  Location: [facility];  Service: Urology;  Laterality: Bilateral;   TRANSURETHRAL RESECTION OF BLADDER TUMOR Bilateral 08/22/2023   Procedure: TRANSURETHRAL RESECTION OF BLADDER TUMOR (TURBT);  Surgeon: [name], MD;  Location: [facility];  Service: Urology;  Laterality: Bilateral;   TRANSURETHRAL RESECTION OF BLADDER TUMOR N/A 10/25/2023   Procedure: TRANSURETHRAL RESECTION OF BLADDER TUMOR (TURBT);  Surgeon: [name], MD;  Location: [facility];  Service: Urology;  Laterality: N/A;    SH: Social History   Tobacco Use   Smoking status: Never   Smokeless tobacco: Never  Vaping Use  Vaping status: Never Used  Substance Use Topics   Alcohol use: Yes    Comment: Occasional   Drug use: No    ROS: Constitutional:  Negative for fever, chills, weight loss CV: Negative for chest pain, previous MI, hypertension Respiratory:  Negative for shortness of breath, wheezing, sleep apnea, frequent cough GI:  Negative for nausea, vomiting, bloody  stool, GERD  PE: GENERAL APPEARANCE:  Well appearing, well developed, well nourished, NAD   Results: Results for orders placed or performed in visit on 01/18/24 (from the past 24 hours)  Microscopic Examination   Collection Time: 01/18/24 12:00 AM   Urine  Result Value Ref Range   WBC, UA 6-10 (A) 0 - 5 /hpf   RBC, Urine >30 (A) 0 - 2 /hpf   Epithelial Cells (non renal) 0-10 0 - 10 /hpf   Bacteria, UA Few None seen/Few  Urinalysis, Routine w reflex microscopic   Collection Time: 01/18/24 12:00 AM  Result Value Ref Range   Specific Gravity, UA 1.015 1.005 - 1.030   pH, UA 6.0 5.0 - 7.5   Color, UA Yellow Yellow   Appearance Ur Hazy (A) Clear   Leukocytes,UA 1+ (A) Negative   Protein,UA 1+ (A) Negative/Trace   Glucose, UA Negative Negative   Ketones, UA Negative Negative   RBC, UA 3+ (A) Negative   Bilirubin, UA Negative Negative   Urobilinogen, Ur 0.2 0.2 - 1.0 mg/dL   Nitrite, UA Negative Negative   Microscopic Examination See below:

## 2024-01-18 NOTE — Progress Notes (Signed)
 BCG Bladder Instillation  BCG # 6  Due to Bladder Cancer patient is present today for a BCG treatment. Patient was cleaned and prepped in a sterile fashion with betadine. A 14FR catheter was inserted, urine return was noted 50ml, urine was yellow in color.  50ml of reconstituted BCG was instilled into the bladder. The catheter was then removed. Patient tolerated well, no complications were noted  Performed by: Erez Mccallum N., CMA(AAMA)

## 2024-02-12 NOTE — Progress Notes (Signed)
 Anesthesia Review:  PCP: Abran Richard LOV 12/12/23  Cardiologist :  PPM/ ICD: Device Orders: Rep Notified:  Chest x-ray : EKG : 07/05/23  Echo : 07/25/23  Stress test: 2018  Cardiac Cath :  2018   Activity level:  Sleep Study/ CPAP : Fasting Blood Sugar :      / Checks Blood Sugar -- times a day:    Blood Thinner/ Instructions /Last Dose: ASA / Instructions/ Last Dose :

## 2024-02-12 NOTE — Patient Instructions (Addendum)
 SURGICAL WAITING ROOM VISITATION  Patients having surgery or a procedure may have no more than 2 support people in the waiting area - these visitors may rotate.    Children under the age of 36 must have an adult with them who is not the patient.  Due to an increase in RSV and influenza rates and associated hospitalizations, children ages 56 and under may not visit patients in [facility] hospitals.  Visitors with respiratory illnesses are discouraged from visiting and should remain at home.  If the patient needs to stay at the hospital during part of their recovery, the visitor guidelines for inpatient rooms apply. Pre-op nurse will coordinate an appropriate time for 1 support person to accompany patient in pre-op.  This support person may not rotate.    Please refer to the [facility] website for the visitor guidelines for Inpatients (after your surgery is over and you are in a regular room).       Your procedure is scheduled on:  02/27/2024    Report to [facility] Main Entrance    Report to admitting at   1200 noon    Call this number if you have problems the morning of surgery [phone]   Do not eat food  or drink liquids :After Midnight.              If you have questions, please contact your surgeon's office.      Oral Hygiene is also important to reduce your risk of infection.                                    Remember - BRUSH YOUR TEETH THE MORNING OF SURGERY WITH YOUR REGULAR TOOTHPASTE  DENTURES WILL BE REMOVED PRIOR TO SURGERY PLEASE DO NOT APPLY "Poly grip" OR ADHESIVES!!!   Do NOT smoke after Midnight   Stop all vitamins and herbal supplements 7 days before surgery.   Take these medicines the morning of surgery with A SIP OF WATER:   amlodipine   DO NOT TAKE ANY ORAL DIABETIC MEDICATIONS DAY OF YOUR SURGERY  Bring CPAP mask and tubing day of surgery.                              You may not have any metal on your body including hair pins,  jewelry, and body piercing             Do not wear make-up, lotions, powders, perfumes/cologne, or deodorant  Do not wear nail polish including gel and S&S, artificial/acrylic nails, or any other type of covering on natural nails including finger and toenails. If you have artificial nails, gel coating, etc. that needs to be removed by a nail salon please have this removed prior to surgery or surgery may need to be canceled/ delayed if the surgeon/ anesthesia feels like they are unable to be safely monitored.   Do not shave  48 hours prior to surgery.               Men may shave face and neck.   Do not bring valuables to the hospital. [facility] IS NOT             RESPONSIBLE   FOR VALUABLES.   Contacts, glasses, dentures or bridgework may not be worn into surgery.   Bring small overnight bag day of  surgery.   DO NOT BRING YOUR HOME MEDICATIONS TO THE HOSPITAL. PHARMACY WILL DISPENSE MEDICATIONS LISTED ON YOUR MEDICATION LIST TO YOU DURING YOUR ADMISSION IN THE HOSPITAL!    Patients discharged on the day of surgery will not be allowed to drive home.  Someone NEEDS to stay with you for the first 24 hours after anesthesia.   Special Instructions: Bring a copy of your healthcare power of attorney and living will documents the day of surgery if you haven't scanned them before.              Please read over the following fact sheets you were given: IF YOU HAVE QUESTIONS ABOUT YOUR PRE-OP INSTRUCTIONS PLEASE CALL [phone]   If you received a COVID test during your pre-op visit  it is requested that you wear a mask when out in public, stay away from anyone that may not be feeling well and notify your surgeon if you develop symptoms. If you test positive for Covid or have been in contact with anyone that has tested positive in the last 10 days please notify you surgeon.    [place] - Preparing for Surgery Before surgery, you can play an important role.  Because skin is not sterile, your  skin needs to be as free of germs as possible.  You can reduce the number of germs on your skin by washing with CHG (chlorahexidine gluconate) soap before surgery.  CHG is an antiseptic cleaner which kills germs and bonds with the skin to continue killing germs even after washing. Please DO NOT use if you have an allergy to CHG or antibacterial soaps.  If your skin becomes reddened/irritated stop using the CHG and inform your nurse when you arrive at Short Stay. Do not shave (including legs and underarms) for at least 48 hours prior to the first CHG shower.  You may shave your face/neck. Please follow these instructions carefully:  1.  Shower with CHG Soap the night before surgery and the  morning of Surgery.  2.  If you choose to wash your hair, wash your hair first as usual with your  normal  shampoo.  3.  After you shampoo, rinse your hair and body thoroughly to remove the  shampoo.                           4.  Use CHG as you would any other liquid soap.  You can apply chg directly  to the skin and wash                       Gently with a scrungie or clean washcloth.  5.  Apply the CHG Soap to your body ONLY FROM THE NECK DOWN.   Do not use on face/ open                           Wound or open sores. Avoid contact with eyes, ears mouth and genitals (private parts).                       Wash face,  Genitals (private parts) with your normal soap.             6.  Wash thoroughly, paying special attention to the area where your surgery  will be performed.  7.  Thoroughly rinse your body with warm water from the neck  down.  8.  DO NOT shower/wash with your normal soap after using and rinsing off  the CHG Soap.                9.  Pat yourself dry with a clean towel.            10.  Wear clean pajamas.            11.  Place clean sheets on your bed the night of your first shower and do not  sleep with pets. Day of Surgery : Do not apply any lotions/deodorants the morning of surgery.  Please wear  clean clothes to the hospital/surgery center.  FAILURE TO FOLLOW THESE INSTRUCTIONS MAY RESULT IN THE CANCELLATION OF YOUR SURGERY PATIENT SIGNATURE_________________________________  NURSE SIGNATURE__________________________________  ________________________________________________________________________

## 2024-02-14 ENCOUNTER — Other Ambulatory Visit: Payer: Self-pay

## 2024-02-14 ENCOUNTER — Encounter (HOSPITAL_COMMUNITY): Payer: Self-pay

## 2024-02-14 ENCOUNTER — Encounter (HOSPITAL_COMMUNITY)
Admission: RE | Admit: 2024-02-14 | Discharge: 2024-02-14 | Disposition: A | Discharge status: Home / Self Care | Source: Ambulatory Visit | Attending: Urology | Admitting: Urology

## 2024-02-14 VITALS — BP 138/73 | HR 57 | Temp 98.2°F | Resp 16 | Ht 68.0 in | Wt 202.0 lb

## 2024-02-14 DIAGNOSIS — Z951 Presence of aortocoronary bypass graft: Secondary | ICD-10-CM | POA: Insufficient documentation

## 2024-02-14 DIAGNOSIS — Z8551 Personal history of malignant neoplasm of bladder: Secondary | ICD-10-CM | POA: Insufficient documentation

## 2024-02-14 DIAGNOSIS — Z8774 Personal history of (corrected) congenital malformations of heart and circulatory system: Secondary | ICD-10-CM | POA: Diagnosis not present

## 2024-02-14 DIAGNOSIS — Z01818 Encounter for other preprocedural examination: Secondary | ICD-10-CM | POA: Diagnosis not present

## 2024-02-14 DIAGNOSIS — C679 Malignant neoplasm of bladder, unspecified: Secondary | ICD-10-CM | POA: Insufficient documentation

## 2024-02-14 LAB — BASIC METABOLIC PANEL WITH GFR
Anion gap: 8 (ref 5–15)
BUN: 18 mg/dL (ref 8–23)
CO2: 26 mmol/L (ref 22–32)
Calcium: 8.8 mg/dL — ABNORMAL LOW (ref 8.9–10.3)
Chloride: 103 mmol/L (ref 98–111)
Creatinine, Ser: 0.98 mg/dL (ref 0.61–1.24)
GFR, Estimated: >60 mL/min (ref >60)
Glucose, Bld: 92 mg/dL (ref 70–99)
Potassium: 4.8 mmol/L (ref 3.5–5.1)
Sodium: 137 mmol/L (ref 135–145)

## 2024-02-14 LAB — CBC
HCT: 43.1 % (ref 39.0–52.0)
Hemoglobin: 13.7 g/dL (ref 13.0–17.0)
MCH: 27.5 pg (ref 26.0–34.0)
MCHC: 31.8 g/dL (ref 30.0–36.0)
MCV: 86.5 fL (ref 80.0–100.0)
Platelets: 257 10*3/uL (ref 150–400)
RBC: 4.98 MIL/uL (ref 4.22–5.81)
RDW: 14.7 % (ref 11.5–15.5)
WBC: 7.5 10*3/uL (ref 4.0–10.5)
nRBC: 0 % (ref 0.0–0.2)

## 2024-02-15 NOTE — Progress Notes (Signed)
 Case: [id number] Date/Time: 02/27/24 1345   Procedures:      CYSTOSCOPY, WITH STENT INSERTION (Bilateral)     TURBT (TRANSURETHRAL RESECTION OF BLADDER TUMOR)   Anesthesia type: General   Diagnosis: Malignant neoplasm of urinary bladder, unspecified site (HCC) [C67.9]   Pre-op diagnosis: bladder cancer   [facility]   Surgeons: [name], MD       DISCUSSION: Dominic Ray is a 68 yo male who presents to PAT prior to surgery above. PMH of CAD s/p CABG x 4 (2018), heart murmur, hx of bladder cancer s/p TURBT (07/2023, 08/2023, 10/2023).  Patient is undergoing BCG installations for bladder cancer. He has completed 4 prior installations without difficulty. Now scheduled for procedure above.  Patient follows with Cardiology for hx of CAD s/p CABG x 4 in 2018. He was last seen on 07/05/23 by Dr. Jens Som. Doing well at that time. Advised continue medical therapy. Echo was ordered for murmur heard on exam which was done 07/25/23 and showed normal LVEF, grade II DD, mild AS and mild AI.Aortic valve area, by VTI measures 1.58 cm. Aortic valve mean gradient measures 14.0 mmHg. Advised repeat echo in 2 years.   VS: BP 138/73   Pulse (!) 57   Temp 36.8 C (Oral)   Resp 16   Ht 5\' 8"  (1.727 m)   Wt 91.6 kg   SpO2 97%   BMI 30.71 kg/m   PROVIDERS: Monica Becton, MD Cardiology: Olga Millers, MD  LABS: Labs reviewed: Acceptable for surgery. (all labs ordered are listed, but only abnormal results are displayed)  Labs Reviewed  BASIC METABOLIC PANEL WITH GFR - Abnormal; Notable for the following components:      Result Value   Calcium 8.8 (*)    All other components within normal limits  CBC     IMAGES:  PET scan 11/07/23:  IMPRESSION: 1. No evidence of bladder cancer metastasis. 2. No metastatic pelvic adenopathy by FDG PET imaging. 3. Bilateral double-J ureteral stents appear well located. 4. Cholelithiasis.   EKG:   CV:  Echo  07/25/23:  IMPRESSIONS    1. Left ventricular ejection fraction, by estimation, is 55 to 60%. The left ventricle has normal function. The left ventricle has no regional wall motion abnormalities. Left ventricular diastolic parameters are consistent with Grade II diastolic dysfunction (pseudonormalization).  2. Right ventricular systolic function is normal. The right ventricular size is normal.  3. The mitral valve is normal in structure. Trivial mitral valve regurgitation. No evidence of mitral stenosis.  4. The aortic valve is calcified. There is mild calcification of the aortic valve. There is mild thickening of the aortic valve. Aortic valve regurgitation is mild. Mild aortic valve stenosis. Aortic valve area, by VTI measures 1.58 cm. Aortic valve mean gradient measures 14.0 mmHg.  5. The inferior vena cava is normal in size with greater than 50% respiratory variability, suggesting right atrial pressure of 3 mmHg.  Comparison(s): Echocardiogram done 05/09/17 showed an EF of 50-55%. Past Medical History:  Diagnosis Date   Basal cell carcinoma of nasal tip 08/24/2012   Bladder cancer [facility]) 2024   Follows w/ Dr. Marcha Solders, Urology.   Coronary artery disease    a. Abnl stress test -> s/p CABGx4 in 04/2017 with LIMA-LAD, SVG-diag, SVG-AM, SVG-PDA.   Heart murmur    Hyperlipidemia    Follows w/ Dr. Jens Som, cardiology.   Male hypogonadism 08/24/2012   Onychomycosis 08/24/2012   Pneumonia 1967  Past Surgical History:  Procedure Laterality Date   CARDIOVASCULAR STRESS TEST  04/2017   COLONOSCOPY  12/06/2022   one descending colon polyp   CORONARY ARTERY BYPASS GRAFT N/A 05/09/2017   Procedure: CORONARY ARTERY BYPASS GRAFTING times four using left internal mammary artery and right leg saphenous vein  ;  Surgeon: Alleen Borne, MD;  Location: [facility] OR;  Service: Open Heart Surgery;  Laterality: N/A;   CYSTOSCOPY WITH RETROGRADE PYELOGRAM, URETEROSCOPY AND STENT PLACEMENT  Bilateral 10/25/2023   Procedure: CYSTOSCOPY WITH RETROGRADE PYELOGRAM, URETEROSCOPY AND STENT PLACEMENT;  Surgeon: [name], MD;  Location: [facility];  Service: Urology;  Laterality: Bilateral;   LEFT HEART CATH AND CORONARY ANGIOGRAPHY N/A 05/08/2017   Procedure: Left Heart Cath and Coronary Angiography;  Surgeon: Lennette Bihari, MD;  Location: [facility] INVASIVE CV LAB;  Service: Cardiovascular;  Laterality: N/A;   TEE WITHOUT CARDIOVERSION N/A 05/09/2017   Procedure: TRANSESOPHAGEAL ECHOCARDIOGRAM (TEE);  Surgeon: Alleen Borne, MD;  Location: [facility] OR;  Service: Open Heart Surgery;  Laterality: N/A;   TONSILLECTOMY  1968   TRANSURETHRAL RESECTION OF BLADDER TUMOR Bilateral 07/18/2023   Procedure: TRANSURETHRAL RESECTION OF BLADDER TUMOR (TURBT);  Surgeon: [name], MD;  Location[facility];  Service: Urology;  Laterality: Bilateral;   TRANSURETHRAL RESECTION OF BLADDER TUMOR Bilateral 08/22/2023   Procedure: TRANSURETHRAL RESECTION OF BLADDER TUMOR (TURBT);  Surgeon: [name], MD;  Location: [facility];  Service: Urology;  Laterality: Bilateral;   TRANSURETHRAL RESECTION OF BLADDER TUMOR N/A 10/25/2023   Procedure: TRANSURETHRAL RESECTION OF BLADDER TUMOR (TURBT);  Surgeon: [name], MD;  Location: [facility];  Service: Urology;  Laterality: N/A;    MEDICATIONS:  amLODipine (NORVASC) 5 MG tablet   aspirin EC 81 MG tablet   atorvastatin (LIPITOR) 80 MG tablet   finasteride (PROSCAR) 5 MG tablet   metoprolol succinate (TOPROL-XL) 25 MG 24 hr tablet   oxybutynin (DITROPAN) 5 MG tablet   tamsulosin (FLOMAX) 0.4 MG CAPS capsule   No current facility-administered medications for this encounter.   Marcille Blanco [facility] Short Stay/Anesthesiology [facility] Phone [phone] 02/15/2024 8:54 AM

## 2024-02-15 NOTE — Anesthesia Preprocedure Evaluation (Addendum)
 Anesthesia Evaluation  Patient identified by MRN, date of birth, ID band Patient awake    Reviewed: Allergy & Precautions, H&P , NPO status , Patient's Chart, lab work & pertinent test results, reviewed documented beta blocker date and time   Airway Mallampati: II  TM Distance: >3 FB Neck ROM: Full    Dental no notable dental hx. (+) Teeth Intact, Dental Advisory Given   Pulmonary neg pulmonary ROS   Pulmonary exam normal breath sounds clear to auscultation       Cardiovascular hypertension, Pt. on medications and Pt. on home beta blockers + CAD and + CABG   Rhythm:Regular Rate:Normal     Neuro/Psych negative neurological ROS  negative psych ROS   GI/Hepatic negative GI ROS, Neg liver ROS,,,  Endo/Other  negative endocrine ROS    Renal/GU negative Renal ROS  negative genitourinary   Musculoskeletal   Abdominal   Peds  Hematology negative hematology ROS (+)   Anesthesia Other Findings   Reproductive/Obstetrics negative OB ROS                             Anesthesia Physical Anesthesia Plan  ASA: 3  Anesthesia Plan: General   Post-op Pain Management: Ofirmev IV (intra-op)*   Induction: Intravenous  PONV Risk Score and Plan: 3 and Dexamethasone, Ondansetron and Midazolam  Airway Management Planned: Oral ETT and Video Laryngoscope Planned  Additional Equipment:   Intra-op Plan:   Post-operative Plan: Extubation in OR  Informed Consent: I have reviewed the patients History and Physical, chart, labs and discussed the procedure including the risks, benefits and alternatives for the proposed anesthesia with the patient or authorized representative who has indicated his/her understanding and acceptance.     Dental advisory given  Plan Discussed with: CRNA  Anesthesia Plan Comments: (See PAT note from 4/2)        Anesthesia Quick Evaluation

## 2024-02-16 ENCOUNTER — Encounter (HOSPITAL_COMMUNITY)

## 2024-02-20 ENCOUNTER — Encounter: Payer: Self-pay | Admitting: Urology

## 2024-02-20 ENCOUNTER — Ambulatory Visit: Admitting: Urology

## 2024-02-20 VITALS — BP 141/66 | HR 69

## 2024-02-20 DIAGNOSIS — C678 Malignant neoplasm of overlapping sites of bladder: Secondary | ICD-10-CM

## 2024-02-20 DIAGNOSIS — N401 Enlarged prostate with lower urinary tract symptoms: Secondary | ICD-10-CM

## 2024-02-20 LAB — URINALYSIS, ROUTINE W REFLEX MICROSCOPIC
Bilirubin, UA: NEGATIVE
Glucose, UA: NEGATIVE
Ketones, UA: NEGATIVE
Nitrite, UA: NEGATIVE
Specific Gravity, UA: 1.02 (ref 1.005–1.030)
Urobilinogen, Ur: 0.2 mg/dL (ref 0.2–1.0)
pH, UA: 6 (ref 5.0–7.5)

## 2024-02-20 LAB — MICROSCOPIC EXAMINATION: RBC, Urine: >30 /HPF — AB (ref 0–2)

## 2024-02-20 NOTE — H&P (View-Only) (Signed)
 Assessment: 1. Malignant neoplasm of overlapping sites of bladder (HCC)   2. Benign localized prostatic hyperplasia with lower urinary tract symptoms (LUTS)     Plan: Will proceed with complete disease assessment (cysto, bladder biopsy, possible turbt) and bilateral stent exchange. Procedure again discussed in detail today and all patient's questions answered.     Chief Complaint:  bladder cancer   HPI: Dominic Ray is a 68 y.o. male who presents for continued evaluation of high risk nonmuscle invasive bladder cancer.  Patient also has BPH with large gland on combination medical therapy with tamsulosin and finasteride. Patient reports clinically that he is doing quite well.  Stable lower urinary tract symptoms without recent gross hematuria.  See my note 06/14/2023 at time of initial visit for detailed history. Presented initially with worsening LUTS and gross hematuria.   PSA 06/2023=  3.1 Prostate volume (US)= 71ml   Heme eval-- Office cysto demonstrated bladder tumor CT heme protocol 06/20/2023-bladder wall thickening concerning for bladder cancer without evidence of metastatic disease or upper tract abnormalities.   Bladder cancer summary: Status post TURBT 07/18/2023-pathology high-grade T1-muscularis propria present but uninvolved--large area of tumor involvement trigone, right floor/lateral wall and around bladder neck. RUO not identified.  Left UO seen   Status post repeat TURBT 08/22/2023-pathology high-grade TA/question focal T1.  Muscularis propria present but uninvolved.  Comment was made of focal inverted growth pattern which could suggest potentially an aggressive phenotype. RUO again not visualized.  Left UO transected and wide open with clear efflux   Renal US 09/2023-- mild-mod bilateral hydro and marked bladder wall thickening Lab:  09/2023-- CR 0.94   CT-hematuria protocol 10/07/2023-mild to moderate bilateral hydro to the level of the bladder with contrast seen  entering bladder.   Given the bilateral hydro and concern for worsening obstruction with potential inflammation with BCG treatment the patient was taken back to the operating room on 10/25/2023. At that time I was able to resect both trigone areas and identify both ureteral openings which were subsequently stented.  Unfortunately and concerning there was evidence of significant regrowth of tumor in the prior interval involving the right trigone and bladder neck region.   Pathology demonstrates persistent T1 TCC-high-grade.  Patient completed induction BCG 01/2024    Portions of the above documentation were copied from a prior visit for review purposes only.  Allergies: Allergies  Allergen Reactions   [name] [Lidocaine] Rash    Anything ending in (-caine) per patient  Childhood allergy    PMH: Past Medical History:  Diagnosis Date   Basal cell carcinoma of nasal tip 08/24/2012   Bladder cancer (HCC) 2024   Follows w/ Dr. Marcha Solders, Urology.   Coronary artery disease    a. Abnl stress test -> s/p CABGx4 in 04/2017 with LIMA-LAD, SVG-diag, SVG-AM, SVG-PDA.   Heart murmur    Hyperlipidemia    Follows w/ Dr. Jens Som, cardiology.   Male hypogonadism 08/24/2012   Onychomycosis 08/24/2012   Pneumonia 1967    PSH: Past Surgical History:  Procedure Laterality Date   CARDIOVASCULAR STRESS TEST  04/2017   COLONOSCOPY  12/06/2022   one descending colon polyp   CORONARY ARTERY BYPASS GRAFT N/A 05/09/2017   Procedure: CORONARY ARTERY BYPASS GRAFTING times four using left internal mammary artery and right leg saphenous vein  ;  Surgeon: Alleen Borne, MD;  Location: [facility] OR;  Service: Open Heart Surgery;  Laterality: N/A;   CYSTOSCOPY WITH RETROGRADE PYELOGRAM, URETEROSCOPY AND STENT PLACEMENT Bilateral 10/25/2023  Procedure: CYSTOSCOPY WITH RETROGRADE PYELOGRAM, URETEROSCOPY AND STENT PLACEMENT;  Surgeon: [name], MD;  Location: [facility];  Service:  Urology;  Laterality: Bilateral;   LEFT HEART CATH AND CORONARY ANGIOGRAPHY N/A 05/08/2017   Procedure: Left Heart Cath and Coronary Angiography;  Surgeon: Lennette Bihari, MD;  Location: [facility] INVASIVE CV LAB;  Service: Cardiovascular;  Laterality: N/A;   TEE WITHOUT CARDIOVERSION N/A 05/09/2017   Procedure: TRANSESOPHAGEAL ECHOCARDIOGRAM (TEE);  Surgeon: Alleen Borne, MD;  Location: [facility] OR;  Service: Open Heart Surgery;  Laterality: N/A;   TONSILLECTOMY  1968   TRANSURETHRAL RESECTION OF BLADDER TUMOR Bilateral 07/18/2023   Procedure: TRANSURETHRAL RESECTION OF BLADDER TUMOR (TURBT);  Surgeon: [name], MD;  Location: [facility] ORS;  Service: Urology;  Laterality: Bilateral;   TRANSURETHRAL RESECTION OF BLADDER TUMOR Bilateral 08/22/2023   Procedure: TRANSURETHRAL RESECTION OF BLADDER TUMOR (TURBT);  Surgeon: [name], MD;  Location: [facility];  Service: Urology;  Laterality: Bilateral;   TRANSURETHRAL RESECTION OF BLADDER TUMOR N/A 10/25/2023   Procedure: TRANSURETHRAL RESECTION OF BLADDER TUMOR (TURBT);  Surgeon: [name], MD;  Location: [facility];  Service: Urology;  Laterality: N/A;    SH: Social History   Tobacco Use   Smoking status: Never   Smokeless tobacco: Never  Vaping Use   Vaping status: Never Used  Substance Use Topics   Alcohol use: Yes    Comment: Occasional   Drug use: No    ROS: Constitutional:  Negative for fever, chills, weight loss CV: Negative for chest pain, previous MI, hypertension Respiratory:  Negative for shortness of breath, wheezing, sleep apnea, frequent cough GI:  Negative for nausea, vomiting, bloody stool, GERD  PE: There were no vitals taken for this visit. GENERAL APPEARANCE:  Well appearing, well developed, well nourished, NAD HEENT:  Atraumatic, normocephalic COR:  RR LUNGS:  CTA ABDOMEN:  Soft, non-tender, no masses EXTREMITIES:  Moves all extremities well, without clubbing, cyanosis, or edema NEUROLOGIC:   Alert and oriented x 3, normal gait, CN II-XII grossly intact MENTAL STATUS:  appropriate BACK:  Non-tender to palpation, No CVAT SKIN:  Warm, dry, and intact   Results: UA show low level pyuria and microhematuria

## 2024-02-20 NOTE — Progress Notes (Signed)
 Assessment: 1. Malignant neoplasm of overlapping sites of bladder (HCC)   2. Benign localized prostatic hyperplasia with lower urinary tract symptoms (LUTS)     Plan: Will proceed with complete disease assessment (cysto, bladder biopsy, possible turbt) and bilateral stent exchange. Procedure again discussed in detail today and all patient's questions answered.     Chief Complaint:  bladder cancer   HPI: Dominic Ray is a 68 y.o. male who presents for continued evaluation of high risk nonmuscle invasive bladder cancer.  Patient also has BPH with large gland on combination medical therapy with tamsulosin and finasteride. Patient reports clinically that he is doing quite well.  Stable lower urinary tract symptoms without recent gross hematuria.  See my note 06/14/2023 at time of initial visit for detailed history. Presented initially with worsening LUTS and gross hematuria.   PSA 06/2023=  3.1 Prostate volume (US)= 71ml   Heme eval-- Office cysto demonstrated bladder tumor CT heme protocol 06/20/2023-bladder wall thickening concerning for bladder cancer without evidence of metastatic disease or upper tract abnormalities.   Bladder cancer summary: Status post TURBT 07/18/2023-pathology high-grade T1-muscularis propria present but uninvolved--large area of tumor involvement trigone, right floor/lateral wall and around bladder neck. RUO not identified.  Left UO seen   Status post repeat TURBT 08/22/2023-pathology high-grade TA/question focal T1.  Muscularis propria present but uninvolved.  Comment was made of focal inverted growth pattern which could suggest potentially an aggressive phenotype. RUO again not visualized.  Left UO transected and wide open with clear efflux   Renal US 09/2023-- mild-mod bilateral hydro and marked bladder wall thickening Lab:  09/2023-- CR 0.94   CT-hematuria protocol 10/07/2023-mild to moderate bilateral hydro to the level of the bladder with contrast seen  entering bladder.   Given the bilateral hydro and concern for worsening obstruction with potential inflammation with BCG treatment the patient was taken back to the operating room on 10/25/2023. At that time I was able to resect both trigone areas and identify both ureteral openings which were subsequently stented.  Unfortunately and concerning there was evidence of significant regrowth of tumor in the prior interval involving the right trigone and bladder neck region.   Pathology demonstrates persistent T1 TCC-high-grade.  Patient completed induction BCG 01/2024    Portions of the above documentation were copied from a prior visit for review purposes only.  Allergies: Allergies  Allergen Reactions   [name] [Lidocaine] Rash    Anything ending in (-caine) per patient  Childhood allergy    PMH: Past Medical History:  Diagnosis Date   Basal cell carcinoma of nasal tip 08/24/2012   Bladder cancer (HCC) 2024   Follows w/ Dr. Marcha Solders, Urology.   Coronary artery disease    a. Abnl stress test -> s/p CABGx4 in 04/2017 with LIMA-LAD, SVG-diag, SVG-AM, SVG-PDA.   Heart murmur    Hyperlipidemia    Follows w/ Dr. Jens Som, cardiology.   Male hypogonadism 08/24/2012   Onychomycosis 08/24/2012   Pneumonia 1967    PSH: Past Surgical History:  Procedure Laterality Date   CARDIOVASCULAR STRESS TEST  04/2017   COLONOSCOPY  12/06/2022   one descending colon polyp   CORONARY ARTERY BYPASS GRAFT N/A 05/09/2017   Procedure: CORONARY ARTERY BYPASS GRAFTING times four using left internal mammary artery and right leg saphenous vein  ;  Surgeon: Alleen Borne, MD;  Location: [facility] OR;  Service: Open Heart Surgery;  Laterality: N/A;   CYSTOSCOPY WITH RETROGRADE PYELOGRAM, URETEROSCOPY AND STENT PLACEMENT Bilateral 10/25/2023  Procedure: CYSTOSCOPY WITH RETROGRADE PYELOGRAM, URETEROSCOPY AND STENT PLACEMENT;  Surgeon: [name], MD;  Location: [facility];  Service:  Urology;  Laterality: Bilateral;   LEFT HEART CATH AND CORONARY ANGIOGRAPHY N/A 05/08/2017   Procedure: Left Heart Cath and Coronary Angiography;  Surgeon: Lennette Bihari, MD;  Location: [facility] INVASIVE CV LAB;  Service: Cardiovascular;  Laterality: N/A;   TEE WITHOUT CARDIOVERSION N/A 05/09/2017   Procedure: TRANSESOPHAGEAL ECHOCARDIOGRAM (TEE);  Surgeon: Alleen Borne, MD;  Location: [facility] OR;  Service: Open Heart Surgery;  Laterality: N/A;   TONSILLECTOMY  1968   TRANSURETHRAL RESECTION OF BLADDER TUMOR Bilateral 07/18/2023   Procedure: TRANSURETHRAL RESECTION OF BLADDER TUMOR (TURBT);  Surgeon: [name], MD;  Location: [facility];  Service: Urology;  Laterality: Bilateral;   TRANSURETHRAL RESECTION OF BLADDER TUMOR Bilateral 08/22/2023   Procedure: TRANSURETHRAL RESECTION OF BLADDER TUMOR (TURBT);  Surgeon: [name], MD;  Location: [facility];  Service: Urology;  Laterality: Bilateral;   TRANSURETHRAL RESECTION OF BLADDER TUMOR N/A 10/25/2023   Procedure: TRANSURETHRAL RESECTION OF BLADDER TUMOR (TURBT);  Surgeon: [name], MD;  Location: [facility];  Service: Urology;  Laterality: N/A;    SH: Social History   Tobacco Use   Smoking status: Never   Smokeless tobacco: Never  Vaping Use   Vaping status: Never Used  Substance Use Topics   Alcohol use: Yes    Comment: Occasional   Drug use: No    ROS: Constitutional:  Negative for fever, chills, weight loss CV: Negative for chest pain, previous MI, hypertension Respiratory:  Negative for shortness of breath, wheezing, sleep apnea, frequent cough GI:  Negative for nausea, vomiting, bloody stool, GERD  PE: There were no vitals taken for this visit. GENERAL APPEARANCE:  Well appearing, well developed, well nourished, NAD HEENT:  Atraumatic, normocephalic COR:  RR LUNGS:  CTA ABDOMEN:  Soft, non-tender, no masses EXTREMITIES:  Moves all extremities well, without clubbing, cyanosis, or edema NEUROLOGIC:   Alert and oriented x 3, normal gait, CN II-XII grossly intact MENTAL STATUS:  appropriate BACK:  Non-tender to palpation, No CVAT SKIN:  Warm, dry, and intact   Results: UA show low level pyuria and microhematuria

## 2024-02-27 ENCOUNTER — Ambulatory Visit (HOSPITAL_BASED_OUTPATIENT_CLINIC_OR_DEPARTMENT_OTHER): Admitting: Certified Registered"

## 2024-02-27 ENCOUNTER — Other Ambulatory Visit: Payer: Self-pay

## 2024-02-27 ENCOUNTER — Encounter (HOSPITAL_COMMUNITY): Payer: Self-pay | Admitting: Urology

## 2024-02-27 ENCOUNTER — Ambulatory Visit (HOSPITAL_COMMUNITY): Payer: Self-pay | Admitting: Medical

## 2024-02-27 ENCOUNTER — Encounter (HOSPITAL_COMMUNITY)
Admission: RE | Disposition: A | Discharge status: Home / Self Care | Payer: Self-pay | Source: Ambulatory Visit | Attending: Urology

## 2024-02-27 ENCOUNTER — Ambulatory Visit (HOSPITAL_COMMUNITY)

## 2024-02-27 ENCOUNTER — Ambulatory Visit (HOSPITAL_COMMUNITY)
Admission: RE | Admit: 2024-02-27 | Discharge: 2024-02-27 | Disposition: A | Discharge status: Home / Self Care | Source: Ambulatory Visit | Attending: Urology | Admitting: Urology

## 2024-02-27 DIAGNOSIS — N401 Enlarged prostate with lower urinary tract symptoms: Secondary | ICD-10-CM | POA: Diagnosis not present

## 2024-02-27 DIAGNOSIS — I10 Essential (primary) hypertension: Secondary | ICD-10-CM

## 2024-02-27 DIAGNOSIS — Z951 Presence of aortocoronary bypass graft: Secondary | ICD-10-CM | POA: Diagnosis not present

## 2024-02-27 DIAGNOSIS — N3289 Other specified disorders of bladder: Secondary | ICD-10-CM

## 2024-02-27 DIAGNOSIS — C678 Malignant neoplasm of overlapping sites of bladder: Secondary | ICD-10-CM | POA: Diagnosis not present

## 2024-02-27 DIAGNOSIS — N3020 Other chronic cystitis without hematuria: Secondary | ICD-10-CM | POA: Diagnosis not present

## 2024-02-27 DIAGNOSIS — N3080 Other cystitis without hematuria: Secondary | ICD-10-CM | POA: Diagnosis not present

## 2024-02-27 DIAGNOSIS — I2510 Atherosclerotic heart disease of native coronary artery without angina pectoris: Secondary | ICD-10-CM | POA: Diagnosis not present

## 2024-02-27 DIAGNOSIS — Z01818 Encounter for other preprocedural examination: Secondary | ICD-10-CM

## 2024-02-27 DIAGNOSIS — C679 Malignant neoplasm of bladder, unspecified: Secondary | ICD-10-CM

## 2024-02-27 HISTORY — PX: TRANSURETHRAL RESECTION OF BLADDER TUMOR: SHX2575

## 2024-02-27 HISTORY — PX: CYSTOSCOPY WITH STENT PLACEMENT: SHX5790

## 2024-02-27 SURGERY — CYSTOSCOPY, WITH STENT INSERTION
Anesthesia: General

## 2024-02-27 MED ORDER — PROPOFOL 10 MG/ML IV BOLUS
INTRAVENOUS | Status: DC | PRN
  Administered 2024-02-27: 150 mg via INTRAVENOUS

## 2024-02-27 MED ORDER — LACTATED RINGERS IV SOLN: INTRAVENOUS | Status: DC

## 2024-02-27 MED ORDER — ESMOLOL HCL 100 MG/10ML IV SOLN
INTRAVENOUS | Status: DC | PRN
  Administered 2024-02-27 (×2): 10 mg via INTRAVENOUS

## 2024-02-27 MED ORDER — STERILE WATER FOR IRRIGATION IR SOLN
Status: DC | PRN
  Administered 2024-02-27: 6000 mL

## 2024-02-27 MED ORDER — ORAL CARE MOUTH RINSE: 15.0000 mL | Freq: Once | OROMUCOSAL | Status: AC

## 2024-02-27 MED ORDER — FENTANYL CITRATE (PF) 100 MCG/2ML IJ SOLN
INTRAMUSCULAR | Status: AC
Start: 2024-02-27 — End: ?
  Filled 2024-02-27: qty 2

## 2024-02-27 MED ORDER — HYDRALAZINE HCL 20 MG/ML IJ SOLN
INTRAMUSCULAR | Status: AC
  Filled 2024-02-27: qty 1

## 2024-02-27 MED ORDER — PROPOFOL 10 MG/ML IV BOLUS
INTRAVENOUS | Status: AC
  Filled 2024-02-27: qty 20

## 2024-02-27 MED ORDER — ONDANSETRON HCL 4 MG/2ML IJ SOLN
INTRAMUSCULAR | Status: DC | PRN
  Administered 2024-02-27: 4 mg via INTRAVENOUS

## 2024-02-27 MED ORDER — HYDRALAZINE HCL 20 MG/ML IJ SOLN
INTRAMUSCULAR | Status: DC | PRN
Start: 2024-02-27 — End: 2024-02-27
  Administered 2024-02-27: 5 mg via INTRAVENOUS

## 2024-02-27 MED ORDER — ACETAMINOPHEN 10 MG/ML IV SOLN
INTRAVENOUS | Status: DC | PRN
  Administered 2024-02-27: 1000 mg via INTRAVENOUS

## 2024-02-27 MED ORDER — 0.9 % SODIUM CHLORIDE (POUR BTL) OPTIME
TOPICAL | Status: DC | PRN
  Administered 2024-02-27: 1000 mL

## 2024-02-27 MED ORDER — ONDANSETRON HCL 4 MG/2ML IJ SOLN
INTRAMUSCULAR | Status: AC
  Filled 2024-02-27: qty 2

## 2024-02-27 MED ORDER — ESMOLOL HCL 100 MG/10ML IV SOLN
INTRAVENOUS | Status: AC
  Filled 2024-02-27: qty 10

## 2024-02-27 MED ORDER — DEXAMETHASONE SODIUM PHOSPHATE 10 MG/ML IJ SOLN
INTRAMUSCULAR | Status: AC
  Filled 2024-02-27: qty 1

## 2024-02-27 MED ORDER — SODIUM CHLORIDE 0.9 % IR SOLN
Status: DC | PRN
Start: 2024-02-27 — End: 2024-02-27
  Administered 2024-02-27: 15000 mL via INTRAVESICAL

## 2024-02-27 MED ORDER — MIDAZOLAM HCL 5 MG/5ML IJ SOLN
INTRAMUSCULAR | Status: DC | PRN
  Administered 2024-02-27: 2 mg via INTRAVENOUS

## 2024-02-27 MED ORDER — DEXAMETHASONE SODIUM PHOSPHATE 10 MG/ML IJ SOLN
INTRAMUSCULAR | Status: DC | PRN
  Administered 2024-02-27: 5 mg via INTRAVENOUS

## 2024-02-27 MED ORDER — CHLORHEXIDINE GLUCONATE 0.12 % MT SOLN
15.0000 mL | Freq: Once | OROMUCOSAL | Status: AC
  Administered 2024-02-27: 15 mL via OROMUCOSAL

## 2024-02-27 MED ORDER — SUGAMMADEX SODIUM 200 MG/2ML IV SOLN
INTRAVENOUS | Status: DC | PRN
Start: 2024-02-27 — End: 2024-02-27
  Administered 2024-02-27: 370 mg via INTRAVENOUS

## 2024-02-27 MED ORDER — SODIUM CHLORIDE 0.9 % IV SOLN
2.0000 g | INTRAVENOUS | Status: AC
  Administered 2024-02-27: 2 g via INTRAVENOUS
  Filled 2024-02-27: qty 20

## 2024-02-27 MED ORDER — HYDROMORPHONE HCL 1 MG/ML IJ SOLN: 0.2500 mg | INTRAMUSCULAR | Status: DC | PRN

## 2024-02-27 MED ORDER — ACETAMINOPHEN 10 MG/ML IV SOLN
INTRAVENOUS | Status: AC
  Filled 2024-02-27: qty 100

## 2024-02-27 MED ORDER — FENTANYL CITRATE (PF) 100 MCG/2ML IJ SOLN
INTRAMUSCULAR | Status: AC
  Filled 2024-02-27: qty 2

## 2024-02-27 MED ORDER — ROCURONIUM BROMIDE 10 MG/ML (PF) SYRINGE
PREFILLED_SYRINGE | INTRAVENOUS | Status: AC
  Filled 2024-02-27: qty 10

## 2024-02-27 MED ORDER — ROCURONIUM BROMIDE 100 MG/10ML IV SOLN
INTRAVENOUS | Status: DC | PRN
Start: 2024-02-27 — End: 2024-02-27
  Administered 2024-02-27: 10 mg via INTRAVENOUS
  Administered 2024-02-27: 40 mg via INTRAVENOUS
  Administered 2024-02-27: 20 mg via INTRAVENOUS

## 2024-02-27 MED ORDER — MIDAZOLAM HCL 2 MG/2ML IJ SOLN
INTRAMUSCULAR | Status: AC
  Filled 2024-02-27: qty 2

## 2024-02-27 MED ORDER — FENTANYL CITRATE (PF) 100 MCG/2ML IJ SOLN
INTRAMUSCULAR | Status: DC | PRN
  Administered 2024-02-27 (×2): 25 ug via INTRAVENOUS
  Administered 2024-02-27: 50 ug via INTRAVENOUS
  Administered 2024-02-27 (×4): 25 ug via INTRAVENOUS
  Administered 2024-02-27: 100 ug via INTRAVENOUS

## 2024-02-27 SURGICAL SUPPLY — 22 items
BAG URINE DRAIN 2000ML AR STRL (UROLOGICAL SUPPLIES) IMPLANT
BAG URO CATCHER STRL LF (MISCELLANEOUS) ×2 IMPLANT
CATH URETL OPEN END 6FR 70 (CATHETERS) IMPLANT
CLOTH BEACON ORANGE TIMEOUT ST (SAFETY) ×2 IMPLANT
DRAPE FOOT SWITCH (DRAPES) ×2 IMPLANT
ELECT REM PT RETURN 15FT ADLT (MISCELLANEOUS) ×2 IMPLANT
EVACUATOR MICROVAS BLADDER (UROLOGICAL SUPPLIES) IMPLANT
GLOVE BIO SURGEON STRL SZ8.5 (GLOVE) ×4 IMPLANT
GOWN STRL REUS W/ TWL XL LVL3 (GOWN DISPOSABLE) ×2 IMPLANT
GUIDEWIRE AMPLATZ STIFF 0.35 (WIRE) IMPLANT
GUIDEWIRE STR DUAL SENSOR (WIRE) ×2 IMPLANT
GUIDEWIRE ZIPWRE .038 STRAIGHT (WIRE) IMPLANT
KIT TURNOVER KIT A (KITS) IMPLANT
LOOP CUT BIPOLAR 24F LRG (ELECTROSURGICAL) IMPLANT
MANIFOLD NEPTUNE II (INSTRUMENTS) ×2 IMPLANT
PACK CYSTO (CUSTOM PROCEDURE TRAY) ×2 IMPLANT
PLUG CATH AND CAP STRL 200 (CATHETERS) ×2 IMPLANT
STENT URET 6FRX22 CONTOUR (STENTS) IMPLANT
SYR TOOMEY IRRIG 70ML (MISCELLANEOUS) IMPLANT
SYRINGE TOOMEY IRRIG 70ML (MISCELLANEOUS) IMPLANT
TUBING CONNECTING 10 (TUBING) ×2 IMPLANT
TUBING UROLOGY SET (TUBING) ×2 IMPLANT

## 2024-02-27 NOTE — Anesthesia Postprocedure Evaluation (Signed)
 Anesthesia Post Note  Patient: Dominic Ray  Procedure(s) Performed: CYSTOSCOPY, WITH STENT INSERTION (Bilateral) TURBT (TRANSURETHRAL RESECTION OF BLADDER TUMOR)     Patient location during evaluation: PACU Anesthesia Type: General Level of consciousness: awake and alert Pain management: pain level controlled Vital Signs Assessment: post-procedure vital signs reviewed and stable Respiratory status: spontaneous breathing, nonlabored ventilation and respiratory function stable Cardiovascular status: blood pressure returned to baseline and stable Postop Assessment: no apparent nausea or vomiting Anesthetic complications: no  No notable events documented.  Last Vitals:  Vitals:   02/27/24 1505 02/27/24 1515  BP:    Pulse: 69 69  Resp: 17 17  Temp:    SpO2: 93% 91%    Last Pain:  Vitals:   02/27/24 1500  TempSrc:   PainSc: 0-No pain                 Seth Higginbotham,W. EDMOND

## 2024-02-27 NOTE — Transfer of Care (Signed)
 Immediate Anesthesia Transfer of Care Note  Patient: Dominic Ray  Procedure(s) Performed: CYSTOSCOPY, WITH STENT INSERTION (Bilateral) TURBT (TRANSURETHRAL RESECTION OF BLADDER TUMOR)  Patient Location: PACU  Anesthesia Type:General  Level of Consciousness: awake  Airway & Oxygen Therapy: Patient Spontanous Breathing and Patient connected to nasal cannula oxygen  Post-op Assessment: Report given to RN and Post -op Vital signs reviewed and stable  Post vital signs: Reviewed and stable  Last Vitals:  Vitals Value Taken Time  BP 131/79 02/27/24 1441  Temp    Pulse 82 02/27/24 1443  Resp 14 02/27/24 1443  SpO2 98 % 02/27/24 1443  Vitals shown include unfiled device data.  Last Pain:  Vitals:   02/27/24 1217  TempSrc: Oral         Complications: No notable events documented.

## 2024-02-27 NOTE — Anesthesia Procedure Notes (Signed)
 Procedure Name: Intubation Date/Time: 02/27/2024 1:01 PM  Performed by: Jinny Mounts, CRNAPre-anesthesia Checklist: Patient identified, Emergency Drugs available, Suction available and Patient being monitored Patient Re-evaluated:Patient Re-evaluated prior to induction Oxygen Delivery Method: Circle System Utilized Preoxygenation: Pre-oxygenation with 100% oxygen Induction Type: IV induction Ventilation: Mask ventilation without difficulty and Oral airway inserted - appropriate to patient size Laryngoscope Size: Glidescope and 4 Grade View: Grade I Tube type: Oral Tube size: 7.5 mm Number of attempts: 1 Airway Equipment and Method: Stylet and Oral airway Placement Confirmation: ETT inserted through vocal cords under direct vision, positive ETCO2 and breath sounds checked- equal and bilateral Secured at: 23 cm Tube secured with: Tape Dental Injury: Teeth and Oropharynx as per pre-operative assessment

## 2024-02-27 NOTE — Op Note (Signed)
 OPERATIVE NOTE   Patient Name: Dominic Ray  MRN: [id number]   Date of Procedure: 02/27/24    Preoperative diagnosis:   Bladder cancer  Postoperative diagnosis:  same  Procedure:  Cystoscopy Bilateral dj stent exchange (6x22cm bilaterally) Transurethral resection bladder tumor  Attending: Dorothey Gate, MD  Anesthesia: geta  Estimated blood loss: minimal  Fluids: Per anesthesia record  Drains:  20 Fr Foley  Specimens: turbt chips  Antibiotics: rocephin  Findings: shaggy urothelium on trigone and bladder neck region; ? Tumor vs inflammation  Indications:  High risk nonmuscle invasive bladder cancer  Description of Procedure:  Patient was brought to the operating room where he was correctly identified and underwent satisfactory induction of general endotracheal anesthesia.  He was then positioned in the modified dorsolithotomy position and his external genitalia were prepped and draped in the usual sterile fashion.  Preprocedural timeout was performed.  A 22 [place] scope was subsequently placed per urethra.  The anterior urethra appeared normal.  The prostatic urethra reveals trilobar enlargement with a prior resected area of where the bladder neck.  The bladder was then entered and carefully inspected.  The patient had existing bilateral double-J stents which were identified.  There was no to be shaggy urothelium involving the trigone as well as premed circumferentially involving the bladder neck.  This is consistent with inflammatory change versus questionable tumor.  I began with bilateral stent exchange.  Under endoscopic and fluoroscopic control both the right and left double-J stents were exchanged.  By placing a wire through each step.  6 x 22 cm double-J stents were placed on both the right and left sides with good position obtained.  Following this stent exchange under direct vision I placed a 25 French continuous-flow resectoscope and used the bipolar  apparatus with saline as irrigant.  I resected the shaggy urothelium involving the trigone and circumferentially at the bladder neck.  Use the fulguration cart for hemostasis.  The area of resection was approximatel 5.5 cm in greatest dimension.    All of the tumor material was retrieved using an [employer] evacuator.  After verifying hemostasis was intact the resectoscope was removed.  A 20 French Foley catheter was placed with good position obtaining clear return.  Examination under anesthesia again revealed a large prostate but no definite evidence of bladder wall thickening or mass. Patient tolerated the procedure well.  There were no apparent complications.   Complications: None  Condition: Stable, extubated, transferred to PACU  Plan: FU 2 weeks.  Patient to remove

## 2024-02-27 NOTE — Interval H&P Note (Signed)
 History and Physical Interval Note:  02/27/2024 12:41 PM  Dominic Ray  has presented today for surgery, with the diagnosis of bladder cancer.  The various methods of treatment have been discussed with the patient and family. After consideration of risks, benefits and other options for treatment, the patient has consented to  Procedure(s): CYSTOSCOPY, WITH STENT INSERTION (Bilateral) TURBT (TRANSURETHRAL RESECTION OF BLADDER TUMOR) (N/A) as a surgical intervention.  The patient's history has been reviewed, patient examined, no change in status, stable for surgery.  I have reviewed the patient's chart and labs.  Questions were answered to the patient's satisfaction.     Scarlet Curly

## 2024-02-28 ENCOUNTER — Encounter (HOSPITAL_COMMUNITY): Payer: Self-pay | Admitting: Urology

## 2024-02-28 LAB — SURGICAL PATHOLOGY

## 2024-03-07 ENCOUNTER — Encounter: Payer: Self-pay | Admitting: Urology

## 2024-03-07 ENCOUNTER — Ambulatory Visit (INDEPENDENT_AMBULATORY_CARE_PROVIDER_SITE_OTHER): Admitting: Sports Medicine

## 2024-03-07 VITALS — BP 123/71 | HR 77 | Resp 20 | Wt 205.0 lb

## 2024-03-07 DIAGNOSIS — R739 Hyperglycemia, unspecified: Secondary | ICD-10-CM | POA: Diagnosis not present

## 2024-03-07 DIAGNOSIS — N63 Unspecified lump in unspecified breast: Secondary | ICD-10-CM

## 2024-03-07 DIAGNOSIS — C44311 Basal cell carcinoma of skin of nose: Secondary | ICD-10-CM | POA: Diagnosis not present

## 2024-03-07 DIAGNOSIS — Z0000 Encounter for general adult medical examination without abnormal findings: Secondary | ICD-10-CM | POA: Diagnosis not present

## 2024-03-07 NOTE — Assessment & Plan Note (Signed)
 Status post Mohs surgery, he will follow-up annually with his dermatologist for this.

## 2024-03-07 NOTE — Assessment & Plan Note (Addendum)
 Annual physical exam as above. We will order fasting labs, he will come back for those. He is up-to-date on colon cancer screening, colonoscopy was last year. He does desire single additional colonoscopy in 2034. Not a candidate for lung cancer screening, non-smoker. (PET scan negative for chest process during bladder cancer screening). Needs annual breast cancer screening with mammogram, see below.

## 2024-03-07 NOTE — Progress Notes (Signed)
 Subjective:    CC: Annual Physical Exam  HPI:  This patient is here for their annual physical  I reviewed the past medical history, family history, social history, surgical history, and allergies today and no changes were needed.  Please see the problem list section below in epic for further details.  Past Medical History: Past Medical History:  Diagnosis Date   Basal cell carcinoma of nasal tip 08/24/2012   Bladder cancer [facility]) 2024   Follows w/ Dr. Barkley Boot, Urology.   Coronary artery disease    a. Abnl stress test -> s/p CABGx4 in 04/2017 with LIMA-LAD, SVG-diag, SVG-AM, SVG-PDA.   Heart murmur    Hyperlipidemia    Follows w/ Dr. Audery Blazing, cardiology.   Male hypogonadism 08/24/2012   Onychomycosis 08/24/2012   Pneumonia 1967   Past Surgical History: Past Surgical History:  Procedure Laterality Date   CARDIOVASCULAR STRESS TEST  04/2017   COLONOSCOPY  12/06/2022   one descending colon polyp   CORONARY ARTERY BYPASS GRAFT N/A 05/09/2017   Procedure: CORONARY ARTERY BYPASS GRAFTING times four using left internal mammary artery and right leg saphenous vein  ;  Surgeon: Bartley Lightning, MD;  [facility];  Service: Open Heart Surgery;  Laterality: N/A;   CYSTOSCOPY WITH RETROGRADE PYELOGRAM, URETEROSCOPY AND STENT PLACEMENT Bilateral 10/25/2023   Procedure: CYSTOSCOPY WITH RETROGRADE PYELOGRAM, URETEROSCOPY AND STENT PLACEMENT;  Surgeon: Scarlet Curly, MD;  Location: [facility];  Service: Urology;  Laterality: Bilateral;   CYSTOSCOPY WITH STENT PLACEMENT Bilateral 02/27/2024   Procedure: CYSTOSCOPY, WITH STENT INSERTION;  Surgeon: Scarlet Curly, MD;  [facility];  Service: Urology;  Laterality: Bilateral;   LEFT HEART CATH AND CORONARY ANGIOGRAPHY N/A 05/08/2017   Procedure: Left Heart Cath and Coronary Angiography;  Surgeon: Millicent Ally, MD;  Location: [facility] INVASIVE CV LAB;  Service: Cardiovascular;  Laterality: N/A;   TEE WITHOUT  CARDIOVERSION N/A 05/09/2017   Procedure: TRANSESOPHAGEAL ECHOCARDIOGRAM (TEE);  Surgeon: Bartley Lightning, MD;  Location: [facility] OR;  Service: Open Heart Surgery;  Laterality: N/A;   TONSILLECTOMY  1968   TRANSURETHRAL RESECTION OF BLADDER TUMOR Bilateral 07/18/2023   Procedure: TRANSURETHRAL RESECTION OF BLADDER TUMOR (TURBT);  Surgeon: Scarlet Curly, MD;  [facility];  Service: Urology;  Laterality: Bilateral;   TRANSURETHRAL RESECTION OF BLADDER TUMOR Bilateral 08/22/2023   Procedure: TRANSURETHRAL RESECTION OF BLADDER TUMOR (TURBT);  Surgeon: Scarlet Curly, MD;  [facility];  Service: Urology;  Laterality: Bilateral;   TRANSURETHRAL RESECTION OF BLADDER TUMOR N/A 10/25/2023   Procedure: TRANSURETHRAL RESECTION OF BLADDER TUMOR (TURBT);  Surgeon: Scarlet Curly, MD;  Location: [facility];  Service: Urology;  Laterality: N/A;   TRANSURETHRAL RESECTION OF BLADDER TUMOR N/A 02/27/2024   Procedure: TURBT (TRANSURETHRAL RESECTION OF BLADDER TUMOR);  Surgeon: Scarlet Curly, MD;  [facility];  Service: Urology;  Laterality: N/A;   Social History: Social History   Socioeconomic History   Marital status: Married    Spouse name: Francoise Ishihara   Number of children: 0   Years of education: 16   Highest education level: Bachelor's degree (e.g., BA, AB, BS)  Occupational History    Comment: Unemployed   Occupation: Retired  Tobacco Use   Smoking status: Never   Smokeless tobacco: Never  Vaping Use   Vaping status: Never Used  Substance and Sexual Activity   Alcohol use: Yes    Comment: Occasional   Drug use: No   Sexual activity:  Not on file  Other Topics Concern   Not on file  Social History Narrative   Lives with his wife. He enjoys playing pickle ball.   Social Drivers of Corporate investment banker Strain: Low Risk  (10/09/2023)   Overall Financial Resource Strain (CARDIA)    Difficulty of Paying Living Expenses: Not hard at all  Food Insecurity: No  Food Insecurity (10/09/2023)   Hunger Vital Sign    Worried About Running Out of Food in the Last Year: Never true    Ran Out of Food in the Last Year: Never true  Transportation Needs: No Transportation Needs (10/09/2023)   PRAPARE - Administrator, Civil Service (Medical): No    Lack of Transportation (Non-Medical): No  Physical Activity: Sufficiently Active (10/09/2023)   Exercise Vital Sign    Days of Exercise per Week: 4 days    Minutes of Exercise per Session: 40 min  Stress: No Stress Concern Present (10/09/2023)   [employer] of Occupational Health - Occupational Stress Questionnaire    Feeling of Stress : Not at all  Social Connections: Socially Integrated (10/09/2023)   Social Connection and Isolation Panel [NHANES]    Frequency of Communication with Friends and Family: Three times a week    Frequency of Social Gatherings with Friends and Family: Three times a week    Attends Religious Services: More than 4 times per year    Active Member of Clubs or Organizations: Yes    Attends Engineer, structural: More than 4 times per year    Marital Status: Married   Family History: Family History  Problem Relation Age of Onset   Cancer Mother    CAD Father        Died of MI at age 58   Breast cancer Maternal Grandfather    Colon cancer Neg Hx    Stomach cancer Neg Hx    Colon polyps Neg Hx    Crohn's disease Neg Hx    Esophageal cancer Neg Hx    Rectal cancer Neg Hx    Ulcerative colitis Neg Hx    Allergies: Allergies  Allergen Reactions   [name] [Lidocaine ] Rash    Anything ending in (-caine) per patient  Childhood allergy   Medications: See med rec.  Review of Systems: No headache, visual changes, nausea, vomiting, diarrhea, constipation, dizziness, abdominal pain, skin rash, fevers, chills, night sweats, swollen lymph nodes, weight loss, chest pain, body aches, joint swelling, muscle aches, shortness of breath, mood changes, visual or  auditory hallucinations.  Objective:    General: Well Developed, well nourished, and in no acute distress.  Neuro: Alert and oriented x3, extra-ocular muscles intact, sensation grossly intact. Cranial nerves II through XII are intact, motor, sensory, and coordinative functions are all intact. HEENT: Normocephalic, atraumatic, pupils equal round reactive to light, neck supple, no masses, no lymphadenopathy, thyroid  nonpalpable. Oropharynx, nasopharynx, external ear canals are unremarkable. Skin: Warm and dry, no rashes noted.  Cardiac: Regular rate and rhythm, no murmurs rubs or gallops.  Respiratory: Clear to auscultation bilaterally. Not using accessory muscles, speaking in full sentences.  Abdominal: Soft, nontender, nondistended, positive bowel sounds, no masses, no organomegaly.  Musculoskeletal: Shoulder, elbow, wrist, hip, knee, ankle stable, and with full range of motion.  Impression and Recommendations:    The patient was counselled, risk factors were discussed, anticipatory guidance given.  Annual physical exam Annual physical exam as above. We will order fasting labs, he will come back for  those. He is up-to-date on colon cancer screening, colonoscopy was last year. He does desire single additional colonoscopy in 2034. Not a candidate for lung cancer screening, non-smoker. (PET scan negative for chest process during bladder cancer screening). Needs annual breast cancer screening with mammogram, see below.  Breast mass in male Ultrasound reviewed mammography did show benign intramammary lymph node back in 2024. We will proceed with annual breast cancer screening with a mammogram.  Basal cell carcinoma of nasal tip Status post Mohs surgery, he will follow-up annually with his dermatologist for this.   ____________________________________________ Joselyn Nicely. Sandy Crumb, M.D., ABFM., CAQSM., AME. Primary Care and Sports Medicine [facility]  Adjunct  Professor of [employer] [facility] Medicine  [employer]  Restaurant manager, fast food

## 2024-03-07 NOTE — Assessment & Plan Note (Signed)
 Ultrasound reviewed mammography did show benign intramammary lymph node back in 2024. We will proceed with annual breast cancer screening with a mammogram.

## 2024-03-12 ENCOUNTER — Encounter: Admitting: Urology

## 2024-03-13 ENCOUNTER — Ambulatory Visit (INDEPENDENT_AMBULATORY_CARE_PROVIDER_SITE_OTHER): Admitting: Urology

## 2024-03-13 ENCOUNTER — Encounter: Payer: Self-pay | Admitting: Urology

## 2024-03-13 VITALS — BP 134/77 | HR 73 | Ht 68.0 in | Wt 204.0 lb

## 2024-03-13 DIAGNOSIS — N1330 Unspecified hydronephrosis: Secondary | ICD-10-CM | POA: Diagnosis not present

## 2024-03-13 DIAGNOSIS — C678 Malignant neoplasm of overlapping sites of bladder: Secondary | ICD-10-CM

## 2024-03-13 DIAGNOSIS — C670 Malignant neoplasm of trigone of bladder: Secondary | ICD-10-CM

## 2024-03-13 DIAGNOSIS — R8290 Unspecified abnormal findings in urine: Secondary | ICD-10-CM | POA: Diagnosis not present

## 2024-03-13 LAB — BLADDER SCAN AMB NON-IMAGING

## 2024-03-13 LAB — URINALYSIS, ROUTINE W REFLEX MICROSCOPIC
Bilirubin, UA: NEGATIVE
Glucose, UA: NEGATIVE
Ketones, UA: NEGATIVE
Nitrite, UA: NEGATIVE
Specific Gravity, UA: 1.025 (ref 1.005–1.030)
Urobilinogen, Ur: 0.2 mg/dL (ref 0.2–1.0)
pH, UA: 6 (ref 5.0–7.5)

## 2024-03-13 LAB — MICROSCOPIC EXAMINATION: RBC, Urine: >30 /HPF — AB (ref 0–2)

## 2024-03-13 NOTE — Progress Notes (Signed)
 Assessment: 1. Malignant neoplasm of overlapping sites of bladder (HCC)   2. Bilateral hydronephrosis     Plan: Pathology results discussed with the patient. Treatment plan was discussed with Dr. Del Favia prior to his retirement from the practice. Will proceed with maintenance BCG in approximately 6 weeks. Will plan on stent removal after completion of BCG. Urine culture sent today Return to office in early June for maintenance BCG weekly x 3 weeks I discussed the potential for increased urinary symptoms and gross hematuria with increased activity as long as his stents are in place.  Chief Complaint: Chief Complaint  Patient presents with   Bladder Cancer    HPI: Dominic Ray is a 68 y.o. male who presents for continued evaluation of  high risk nonmuscle invasive bladder cancer.  Patient also has BPH with large gland on combination medical therapy with tamsulosin  and finasteride . Patient reports clinically that he is doing quite well.  Stable lower urinary tract symptoms without recent gross hematuria.   See my note 06/14/2023 at time of initial visit for detailed history. Presented initially with worsening LUTS and gross hematuria.   PSA 06/2023=  3.1 Prostate volume (US )= 71ml   Heme eval-- Office cysto demonstrated bladder tumor CT heme protocol 06/20/2023-bladder wall thickening concerning for bladder cancer without evidence of metastatic disease or upper tract abnormalities.   Bladder cancer summary: Status post TURBT 07/18/2023-pathology high-grade T1-muscularis propria present but uninvolved--large area of tumor involvement trigone, right floor/lateral wall and around bladder neck. RUO not identified.  Left UO seen   Status post repeat TURBT 08/22/2023-pathology high-grade TA/question focal T1.  Muscularis propria present but uninvolved.  Comment was made of focal inverted growth pattern which could suggest potentially an aggressive phenotype. RUO again not visualized.  Left UO  transected and wide open with clear efflux   Renal US  09/2023-- mild-mod bilateral hydro and marked bladder wall thickening Lab:  09/2023-- CR 0.94   CT-hematuria protocol 10/07/2023-mild to moderate bilateral hydro to the level of the bladder with contrast seen entering bladder.   Given the bilateral hydro and concern for worsening obstruction with potential inflammation with BCG treatment the patient was taken back to the operating room on 10/25/2023. At that time I was able to resect both trigone areas and identify both ureteral openings which were subsequently stented.  Unfortunately and concerning there was evidence of significant regrowth of tumor in the prior interval involving the right trigone and bladder neck region.   Pathology demonstrates persistent T1 TCC-high-grade.   Patient completed induction BCG 01/2024. He was taken back to the operating room on 02/27/2024.  He underwent cystoscopy with bilateral ureteral stent exchange and transurethral resection of bladder tumor.  There was some shaggy urothelium on the trigone and bladder neck region. Pathology showed benign urothelium with polypoid cystitis and chronic inflammation without evidence of malignancy.  He returns today for follow-up.  He reports intermittent symptoms of frequency, urgency, hesitancy, and gross hematuria.  His symptoms are typically more noticeable after activity.  He is not having any flank pain.  He continues on tamsulosin .  He did notice some increased hematuria within the past week with increased activity. IPSS = 19.  Portions of the above documentation were copied from a prior visit for review purposes only.  Allergies: Allergies  Allergen Reactions   [name] [Lidocaine ] Rash    Anything ending in (-caine) per patient  Childhood allergy    PMH: Past Medical History:  Diagnosis Date   Basal cell carcinoma of  nasal tip 08/24/2012   Bladder cancer [facility]) 2024   Follows w/ Dr. Barkley Boot, Urology.    Coronary artery disease    a. Abnl stress test -> s/p CABGx4 in 04/2017 with LIMA-LAD, SVG-diag, SVG-AM, SVG-PDA.   Heart murmur    Hyperlipidemia    Follows w/ Dr. Audery Blazing, cardiology.   Male hypogonadism 08/24/2012   Onychomycosis 08/24/2012   Pneumonia 1967    PSH: Past Surgical History:  Procedure Laterality Date   CARDIOVASCULAR STRESS TEST  04/2017   COLONOSCOPY  12/06/2022   one descending colon polyp   CORONARY ARTERY BYPASS GRAFT N/A 05/09/2017   Procedure: CORONARY ARTERY BYPASS GRAFTING times four using left internal mammary artery and right leg saphenous vein  ;  Surgeon: Bartley Lightning, MD;  Location: [facility];  Service: Open Heart Surgery;  Laterality: N/A;   CYSTOSCOPY WITH RETROGRADE PYELOGRAM, URETEROSCOPY AND STENT PLACEMENT Bilateral 10/25/2023   Procedure: CYSTOSCOPY WITH RETROGRADE PYELOGRAM, URETEROSCOPY AND STENT PLACEMENT;  Surgeon: Scarlet Curly, MD;  Location: [facility];  Service: Urology;  Laterality: Bilateral;   CYSTOSCOPY WITH STENT PLACEMENT Bilateral 02/27/2024   Procedure: CYSTOSCOPY, WITH STENT INSERTION;  Surgeon: Scarlet Curly, MD;  [facility];  Service: Urology;  Laterality: Bilateral;   LEFT HEART CATH AND CORONARY ANGIOGRAPHY N/A 05/08/2017   Procedure: Left Heart Cath and Coronary Angiography;  Surgeon: Millicent Ally, MD;  Location: [facility] INVASIVE CV LAB;  Service: Cardiovascular;  Laterality: N/A;   TEE WITHOUT CARDIOVERSION N/A 05/09/2017   Procedure: TRANSESOPHAGEAL ECHOCARDIOGRAM (TEE);  Surgeon: Bartley Lightning, MD;  Location: [facility] OR;  Service: Open Heart Surgery;  Laterality: N/A;   TONSILLECTOMY  1968   TRANSURETHRAL RESECTION OF BLADDER TUMOR Bilateral 07/18/2023   Procedure: TRANSURETHRAL RESECTION OF BLADDER TUMOR (TURBT);  Surgeon: Scarlet Curly, MD;  [facility];  Service: Urology;  Laterality: Bilateral;   TRANSURETHRAL RESECTION OF BLADDER TUMOR Bilateral 08/22/2023   Procedure: TRANSURETHRAL  RESECTION OF BLADDER TUMOR (TURBT);  Surgeon: Scarlet Curly, MD;  [facility];  Service: Urology;  Laterality: Bilateral;   TRANSURETHRAL RESECTION OF BLADDER TUMOR N/A 10/25/2023   Procedure: TRANSURETHRAL RESECTION OF BLADDER TUMOR (TURBT);  Surgeon: Scarlet Curly, MD;  Location: [facility];  Service: Urology;  Laterality: N/A;   TRANSURETHRAL RESECTION OF BLADDER TUMOR N/A 02/27/2024   Procedure: TURBT (TRANSURETHRAL RESECTION OF BLADDER TUMOR);  Surgeon: Scarlet Curly, MD;  Location: [facility];  Service: Urology;  Laterality: N/A;    SH: Social History   Tobacco Use   Smoking status: Never   Smokeless tobacco: Never  Vaping Use   Vaping status: Never Used  Substance Use Topics   Alcohol use: Yes    Comment: Occasional   Drug use: No    ROS: Constitutional:  Negative for fever, chills, weight loss CV: Negative for chest pain, previous MI, hypertension Respiratory:  Negative for shortness of breath, wheezing, sleep apnea, frequent cough GI:  Negative for nausea, vomiting, bloody stool, GERD  PE: BP 134/77   Pulse 73   Ht 5\' 8"  (1.727 m)   Wt 204 lb (92.5 kg)   BMI 31.02 kg/m  GENERAL APPEARANCE:  Well appearing, well developed, well nourished, NAD HEENT:  Atraumatic, normocephalic, oropharynx clear NECK:  Supple without lymphadenopathy or thyromegaly ABDOMEN:  Soft, non-tender, no masses EXTREMITIES:  Moves all extremities well, without clubbing, cyanosis, or edema NEUROLOGIC:  Alert and oriented x 3, normal gait, CN II-XII grossly intact MENTAL  STATUS:  appropriate BACK:  Non-tender to palpation, No CVAT SKIN:  Warm, dry, and intact   Results: U/A: 6-10 WBCs, >30 RBCs, moderate bacteria  PVR = 14 ml

## 2024-03-14 LAB — URINE CULTURE: Organism ID, Bacteria: NO GROWTH

## 2024-03-19 LAB — COMPREHENSIVE METABOLIC PANEL WITH GFR
ALT: 11 IU/L (ref 0–44)
AST: 15 IU/L (ref 0–40)
Albumin: 3.7 g/dL — ABNORMAL LOW (ref 3.9–4.9)
Alkaline Phosphatase: 128 IU/L — ABNORMAL HIGH (ref 44–121)
BUN/Creatinine Ratio: 17 (ref 10–24)
BUN: 20 mg/dL (ref 8–27)
Bilirubin Total: 0.8 mg/dL (ref 0.0–1.2)
CO2: 22 mmol/L (ref 20–29)
Calcium: 8.9 mg/dL (ref 8.6–10.2)
Chloride: 105 mmol/L (ref 96–106)
Creatinine, Ser: 1.18 mg/dL (ref 0.76–1.27)
Globulin, Total: 3 g/dL (ref 1.5–4.5)
Glucose: 83 mg/dL (ref 70–99)
Potassium: 4.9 mmol/L (ref 3.5–5.2)
Sodium: 140 mmol/L (ref 134–144)
Total Protein: 6.7 g/dL (ref 6.0–8.5)
eGFR: 68 mL/min/{1.73_m2} (ref >59)

## 2024-03-19 LAB — CBC
Hematocrit: 42.1 % (ref 37.5–51.0)
Hemoglobin: 13.4 g/dL (ref 13.0–17.7)
MCH: 27.2 pg (ref 26.6–33.0)
MCHC: 31.8 g/dL (ref 31.5–35.7)
MCV: 85 fL (ref 79–97)
Platelets: 312 10*3/uL (ref 150–450)
RBC: 4.93 x10E6/uL (ref 4.14–5.80)
RDW: 14.2 % (ref 11.6–15.4)
WBC: 7.3 10*3/uL (ref 3.4–10.8)

## 2024-03-19 LAB — PSA, TOTAL AND FREE
PSA, Free Pct: 7.8 %
PSA, Free: 0.31 ng/mL
Prostate Specific Ag, Serum: 4 ng/mL (ref 0.0–4.0)

## 2024-03-19 LAB — LIPID PANEL
Chol/HDL Ratio: 2.9 ratio (ref 0.0–5.0)
Cholesterol, Total: 115 mg/dL (ref 100–199)
HDL: 39 mg/dL — ABNORMAL LOW (ref >39)
LDL Chol Calc (NIH): 60 mg/dL (ref 0–99)
Triglycerides: 76 mg/dL (ref 0–149)
VLDL Cholesterol Cal: 16 mg/dL (ref 5–40)

## 2024-03-19 LAB — HEMOGLOBIN A1C
Est. average glucose Bld gHb Est-mCnc: 111 mg/dL
Hgb A1c MFr Bld: 5.5 % (ref 4.8–5.6)

## 2024-03-19 LAB — TSH: TSH: 0.954 u[IU]/mL (ref 0.450–4.500)

## 2024-03-27 ENCOUNTER — Telehealth: Payer: Self-pay | Admitting: Urology

## 2024-03-27 NOTE — Telephone Encounter (Signed)
 Pt wants to know if his test results have came back and if it show if he has a UTI or not? Can you please advise.

## 2024-03-27 NOTE — Telephone Encounter (Signed)
 Returned pts call, LMOM. Pts cuiture was negative and this information was left for him on his VM on 03/15/24.

## 2024-04-01 ENCOUNTER — Other Ambulatory Visit: Payer: Self-pay | Admitting: Sports Medicine

## 2024-04-01 DIAGNOSIS — N139 Obstructive and reflux uropathy, unspecified: Secondary | ICD-10-CM

## 2024-04-04 ENCOUNTER — Ambulatory Visit (INDEPENDENT_AMBULATORY_CARE_PROVIDER_SITE_OTHER)

## 2024-04-04 DIAGNOSIS — Z1231 Encounter for screening mammogram for malignant neoplasm of breast: Secondary | ICD-10-CM

## 2024-04-04 DIAGNOSIS — Z0000 Encounter for general adult medical examination without abnormal findings: Secondary | ICD-10-CM

## 2024-04-17 ENCOUNTER — Encounter: Payer: Self-pay | Admitting: Urology

## 2024-04-17 ENCOUNTER — Ambulatory Visit: Admitting: Urology

## 2024-04-17 DIAGNOSIS — C679 Malignant neoplasm of bladder, unspecified: Secondary | ICD-10-CM | POA: Diagnosis not present

## 2024-04-17 DIAGNOSIS — D494 Neoplasm of unspecified behavior of bladder: Secondary | ICD-10-CM | POA: Diagnosis not present

## 2024-04-17 LAB — URINALYSIS, ROUTINE W REFLEX MICROSCOPIC
Bilirubin, UA: NEGATIVE
Glucose, UA: NEGATIVE
Ketones, UA: NEGATIVE
Nitrite, UA: NEGATIVE
Specific Gravity, UA: 1.02 (ref 1.005–1.030)
Urobilinogen, Ur: 0.2 mg/dL (ref 0.2–1.0)
pH, UA: 6 (ref 5.0–7.5)

## 2024-04-17 LAB — MICROSCOPIC EXAMINATION: RBC, Urine: >30 /HPF — AB (ref 0–2)

## 2024-04-17 MED ORDER — BCG LIVE 50 MG IS SUSR
3.2400 mL | Freq: Once | INTRAVESICAL | Status: AC
  Administered 2024-04-17: 81 mg via INTRAVESICAL

## 2024-04-17 NOTE — Progress Notes (Signed)
 History of Present Illness: Dominic Ray is here today to initiate his first maintenance BCG administration.  He has had no recent dysuria.  He has intermittent hematuria as he does have an indwelling ureteral stent. Past Medical History:  Diagnosis Date   Basal cell carcinoma of nasal tip 08/24/2012   Bladder cancer [facility]) 2024   Follows w/ Dr. Barkley Boot, Urology.   Coronary artery disease    a. Abnl stress test -> s/p CABGx4 in 04/2017 with LIMA-LAD, SVG-diag, SVG-AM, SVG-PDA.   Heart murmur    Hyperlipidemia    Follows w/ Dr. Audery Blazing, cardiology.   Male hypogonadism 08/24/2012   Onychomycosis 08/24/2012   Pneumonia 1967    Past Surgical History:  Procedure Laterality Date   CARDIOVASCULAR STRESS TEST  04/2017   COLONOSCOPY  12/06/2022   one descending colon polyp   CORONARY ARTERY BYPASS GRAFT N/A 05/09/2017   Procedure: CORONARY ARTERY BYPASS GRAFTING times four using left internal mammary artery and right leg saphenous vein  ;  Surgeon: Bartley Lightning, MD;  [facility];  Service: Open Heart Surgery;  Laterality: N/A;   CYSTOSCOPY WITH RETROGRADE PYELOGRAM, URETEROSCOPY AND STENT PLACEMENT Bilateral 10/25/2023   Procedure: CYSTOSCOPY WITH RETROGRADE PYELOGRAM, URETEROSCOPY AND STENT PLACEMENT;  Surgeon: Scarlet Curly, MD;  Location: [facility];  Service: Urology;  Laterality: Bilateral;   CYSTOSCOPY WITH STENT PLACEMENT Bilateral 02/27/2024   Procedure: CYSTOSCOPY, WITH STENT INSERTION;  Surgeon: Scarlet Curly, MD;  [facility];  Service: Urology;  Laterality: Bilateral;   LEFT HEART CATH AND CORONARY ANGIOGRAPHY N/A 05/08/2017   Procedure: Left Heart Cath and Coronary Angiography;  Surgeon: Millicent Ally, MD;  Location: [facility] INVASIVE CV LAB;  Service: Cardiovascular;  Laterality: N/A;   TEE WITHOUT CARDIOVERSION N/A 05/09/2017   Procedure: TRANSESOPHAGEAL ECHOCARDIOGRAM (TEE);  Surgeon: Bartley Lightning, MD;  Location: [facility] OR;  Service: Open Heart  Surgery;  Laterality: N/A;   TONSILLECTOMY  1968   TRANSURETHRAL RESECTION OF BLADDER TUMOR Bilateral 07/18/2023   Procedure: TRANSURETHRAL RESECTION OF BLADDER TUMOR (TURBT);  Surgeon: Scarlet Curly, MD;  [facility];  Service: Urology;  Laterality: Bilateral;   TRANSURETHRAL RESECTION OF BLADDER TUMOR Bilateral 08/22/2023   Procedure: TRANSURETHRAL RESECTION OF BLADDER TUMOR (TURBT);  Surgeon: Scarlet Curly, MD;  [facility];  Service: Urology;  Laterality: Bilateral;   TRANSURETHRAL RESECTION OF BLADDER TUMOR N/A 10/25/2023   Procedure: TRANSURETHRAL RESECTION OF BLADDER TUMOR (TURBT);  Surgeon: Scarlet Curly, MD;  Location: [facility];  Service: Urology;  Laterality: N/A;   TRANSURETHRAL RESECTION OF BLADDER TUMOR N/A 02/27/2024   Procedure: TURBT (TRANSURETHRAL RESECTION OF BLADDER TUMOR);  Surgeon: Scarlet Curly, MD;  [facility];  Service: Urology;  Laterality: N/A;    Home Medications:  Allergies as of 04/17/2024       Reactions   [name] [lidocaine ] Rash   Anything ending in (-caine) per patient  Childhood allergy        Medication List        Accurate as of April 17, 2024 11:40 AM. If you have any questions, ask your nurse or doctor.          amLODipine  5 MG tablet Commonly known as: NORVASC  TAKE 1 TABLET (5 MG TOTAL) BY MOUTH DAILY.   aspirin  EC 81 MG tablet Take 1 tablet (81 mg total) by mouth daily. What changed: when to take this   atorvastatin  80 MG tablet Commonly known as: LIPITOR   TAKE 1 TABLET BY MOUTH EVERY DAY What changed: when to take this   finasteride  5 MG tablet Commonly known as: PROSCAR  Take 1 tablet (5 mg total) by mouth daily. What changed: when to take this   metoprolol  succinate 25 MG 24 hr tablet Commonly known as: TOPROL -XL Take 0.5 tablets (12.5 mg total) by mouth daily. What changed: when to take this   tamsulosin  0.4 MG Caps capsule Commonly known as: FLOMAX  TAKE 1 CAPSULE BY MOUTH  EVERY DAY        Allergies:  Allergies  Allergen Reactions   [name] [Lidocaine ] Rash    Anything ending in (-caine) per patient  Childhood allergy    Family History  Problem Relation Age of Onset   Cancer Mother    CAD Father        Died of MI at age 78   Breast cancer Maternal Grandfather    Colon cancer Neg Hx    Stomach cancer Neg Hx    Colon polyps Neg Hx    Crohn's disease Neg Hx    Esophageal cancer Neg Hx    Rectal cancer Neg Hx    Ulcerative colitis Neg Hx     Social History:  reports that he has never smoked. He has never used smokeless tobacco. He reports current alcohol use. He reports that he does not use drugs.  ROS: A complete review of systems was performed.  All systems are negative except for pertinent findings as noted.  Physical Exam:  Vital signs in last 24 hours: There were no vitals taken for this visit. Constitutional:  Alert and oriented, No acute distress Cardiovascular: Regular rate  Respiratory: Normal respiratory effort Neurologic: Grossly intact, no focal deficits Psychiatric: Normal mood and affect  I have reviewed prior pt notes  I have reviewed urinalysis results--bacteria noted but no significant white blood cells     Impression/Assessment:  High-grade nonmuscle invasive bladder cancer, beginning first maintenance BCG administration  Plan:  -I sent his urine for culture, but I think it is fine to proceed with BCG today  -He will continue planned visits for BCG

## 2024-04-17 NOTE — Progress Notes (Signed)
 BCG Bladder Instillation  BCG # 1  Due to Bladder Cancer patient is present today for a BCG treatment. Patient was cleaned and prepped in a sterile fashion with betadine. A 14FR catheter was inserted, urine return was noted , urine was amber in color.  50ml of reconstituted BCG was instilled into the bladder. The catheter was then removed. Patient tolerated well, no complications were noted  Performed by: C. Candee Cha, LPN and C.Monette Angus, CMA   Follow up/ Additional notes: A ucx was ordered due to u/a results. Will f/u with pt when resulted.

## 2024-04-19 LAB — URINE CULTURE: Organism ID, Bacteria: NO GROWTH

## 2024-04-22 ENCOUNTER — Ambulatory Visit: Payer: Self-pay | Admitting: Urology

## 2024-04-24 ENCOUNTER — Encounter: Payer: Self-pay | Admitting: Urology

## 2024-04-24 ENCOUNTER — Ambulatory Visit: Admitting: Urology

## 2024-04-24 DIAGNOSIS — Z5112 Encounter for antineoplastic immunotherapy: Secondary | ICD-10-CM

## 2024-04-24 DIAGNOSIS — C670 Malignant neoplasm of trigone of bladder: Secondary | ICD-10-CM | POA: Diagnosis not present

## 2024-04-24 LAB — URINALYSIS, ROUTINE W REFLEX MICROSCOPIC
Bilirubin, UA: NEGATIVE
Glucose, UA: NEGATIVE
Ketones, UA: NEGATIVE
Nitrite, UA: NEGATIVE
Specific Gravity, UA: 1.02 (ref 1.005–1.030)
Urobilinogen, Ur: 0.2 mg/dL (ref 0.2–1.0)
pH, UA: 6 (ref 5.0–7.5)

## 2024-04-24 LAB — MICROSCOPIC EXAMINATION
RBC, Urine: >30 /HPF — AB (ref 0–2)
WBC, UA: >30 /HPF — AB (ref 0–5)

## 2024-04-24 MED ORDER — BCG LIVE 50 MG IS SUSR
3.2400 mL | Freq: Once | INTRAVESICAL | Status: AC
  Administered 2024-04-24: 81 mg via INTRAVESICAL

## 2024-04-24 NOTE — Progress Notes (Signed)
 Assessment: 1. Malignant neoplasm of trigone of urinary bladder (HCC)   2. Encounter for antineoplastic immunotherapy     Plan: Continue maintenance BCG #3/3 next week. Will plan on stent removal after completion of BCG.  Chief Complaint: Chief Complaint  Patient presents with   Bladder Cancer    HPI: Dominic Ray is a 68 y.o. male who presents for continued evaluation of  high risk nonmuscle invasive bladder cancer.  Patient also has BPH with large gland on combination medical therapy with tamsulosin  and finasteride . Patient reports clinically that he is doing quite well.  Stable lower urinary tract symptoms without recent gross hematuria.   See my note 06/14/2023 at time of initial visit for detailed history. Presented initially with worsening LUTS and gross hematuria.   PSA 06/2023=  3.1 Prostate volume (US )= 71ml   Heme eval-- Office cysto demonstrated bladder tumor CT heme protocol 06/20/2023-bladder wall thickening concerning for bladder cancer without evidence of metastatic disease or upper tract abnormalities.   Bladder cancer summary: Status post TURBT 07/18/2023-pathology high-grade T1-muscularis propria present but uninvolved--large area of tumor involvement trigone, right floor/lateral wall and around bladder neck. RUO not identified.  Left UO seen   Status post repeat TURBT 08/22/2023-pathology high-grade TA/question focal T1.  Muscularis propria present but uninvolved.  Comment was made of focal inverted growth pattern which could suggest potentially an aggressive phenotype. RUO again not visualized.  Left UO transected and wide open with clear efflux   Renal US  09/2023-- mild-mod bilateral hydro and marked bladder wall thickening Lab:  09/2023-- CR 0.94   CT-hematuria protocol 10/07/2023-mild to moderate bilateral hydro to the level of the bladder with contrast seen entering bladder.   Given the bilateral hydro and concern for worsening obstruction with potential  inflammation with BCG treatment the patient was taken back to the operating room on 10/25/2023. At that time I was able to resect both trigone areas and identify both ureteral openings which were subsequently stented.  Unfortunately and concerning there was evidence of significant regrowth of tumor in the prior interval involving the right trigone and bladder neck region.   Pathology demonstrates persistent T1 TCC-high-grade.   Patient completed induction BCG 01/2024. He was taken back to the operating room on 02/27/2024.  He underwent cystoscopy with bilateral ureteral stent exchange and transurethral resection of bladder tumor.  There was some shaggy urothelium on the trigone and bladder neck region. Pathology showed benign urothelium with polypoid cystitis and chronic inflammation without evidence of malignancy.  He presents today for maintenance BCG # 2/3.   Urine culture from last week showed no growth.  No problems following his last treatment.  Portions of the above documentation were copied from a prior visit for review purposes only.  Allergies: Allergies  Allergen Reactions   [name] [Lidocaine ] Rash    Anything ending in (-caine) per patient  Childhood allergy    PMH: Past Medical History:  Diagnosis Date   Basal cell carcinoma of nasal tip 08/24/2012   Bladder cancer (HCC) 2024   Follows w/ Dr. Barkley Boot, Urology.   Coronary artery disease    a. Abnl stress test -> s/p CABGx4 in 04/2017 with LIMA-LAD, SVG-diag, SVG-AM, SVG-PDA.   Heart murmur    Hyperlipidemia    Follows w/ Dr. Audery Blazing, cardiology.   Male hypogonadism 08/24/2012   Onychomycosis 08/24/2012   Pneumonia 1967    PSH: Past Surgical History:  Procedure Laterality Date   CARDIOVASCULAR STRESS TEST  04/2017   COLONOSCOPY  12/06/2022  one descending colon polyp   CORONARY ARTERY BYPASS GRAFT N/A 05/09/2017   Procedure: CORONARY ARTERY BYPASS GRAFTING times four using left internal mammary artery and  right leg saphenous vein  ;  Surgeon: Bartley Lightning, MD;  Location: [facility];  Service: Open Heart Surgery;  Laterality: N/A;   CYSTOSCOPY WITH RETROGRADE PYELOGRAM, URETEROSCOPY AND STENT PLACEMENT Bilateral 10/25/2023   Procedure: CYSTOSCOPY WITH RETROGRADE PYELOGRAM, URETEROSCOPY AND STENT PLACEMENT;  Surgeon: Scarlet Curly, MD;  Location: [facility];  Service: Urology;  Laterality: Bilateral;   CYSTOSCOPY WITH STENT PLACEMENT Bilateral 02/27/2024   Procedure: CYSTOSCOPY, WITH STENT INSERTION;  Surgeon: Scarlet Curly, MD;  Location: [facility];  Service: Urology;  Laterality: Bilateral;   LEFT HEART CATH AND CORONARY ANGIOGRAPHY N/A 05/08/2017   Procedure: Left Heart Cath and Coronary Angiography;  Surgeon: Millicent Ally, MD;  Location: [facility] INVASIVE CV LAB;  Service: Cardiovascular;  Laterality: N/A;   TEE WITHOUT CARDIOVERSION N/A 05/09/2017   Procedure: TRANSESOPHAGEAL ECHOCARDIOGRAM (TEE);  Surgeon: Bartley Lightning, MD;  Location: [facility];  Service: Open Heart Surgery;  Laterality: N/A;   TONSILLECTOMY  1968   TRANSURETHRAL RESECTION OF BLADDER TUMOR Bilateral 07/18/2023   Procedure: TRANSURETHRAL RESECTION OF BLADDER TUMOR (TURBT);  Surgeon: Scarlet Curly, MD;  Location: [facility];  Service: Urology;  Laterality: Bilateral;   TRANSURETHRAL RESECTION OF BLADDER TUMOR Bilateral 08/22/2023   Procedure: TRANSURETHRAL RESECTION OF BLADDER TUMOR (TURBT);  Surgeon: Scarlet Curly, MD;  Location: [facility];  Service: Urology;  Laterality: Bilateral;   TRANSURETHRAL RESECTION OF BLADDER TUMOR N/A 10/25/2023   Procedure: TRANSURETHRAL RESECTION OF BLADDER TUMOR (TURBT);  Surgeon: Scarlet Curly, MD;  Location: [facility];  Service: Urology;  Laterality: N/A;   TRANSURETHRAL RESECTION OF BLADDER TUMOR N/A 02/27/2024   Procedure: TURBT (TRANSURETHRAL RESECTION OF BLADDER TUMOR);  Surgeon: Scarlet Curly, MD;  Location: [facility];  Service: Urology;  Laterality: N/A;     SH: Social History   Tobacco Use   Smoking status: Never   Smokeless tobacco: Never  Vaping Use   Vaping status: Never Used  Substance Use Topics   Alcohol use: Yes    Comment: Occasional   Drug use: No    ROS: Constitutional:  Negative for fever, chills, weight loss CV: Negative for chest pain, previous MI, hypertension Respiratory:  Negative for shortness of breath, wheezing, sleep apnea, frequent cough GI:  Negative for nausea, vomiting, bloody stool, GERD  PE: GENERAL APPEARANCE:  Well appearing, well developed, well nourished, NAD   Results: U/A: >30 RBC, >30 WBC, mod bacteria

## 2024-04-24 NOTE — Progress Notes (Signed)
 BCG Bladder Instillation  mBCG # 2 of 3  Due to Bladder Cancer patient is present today for a BCG treatment. Patient was cleaned and prepped in a sterile fashion with betadine. A 14FR catheter was inserted, urine return was noted , urine was yellow in color.  50mL of reconstituted BCG was instilled into the bladder. The catheter was then removed. Patient tolerated well, no complications were noted  Performed by: Everlina Hock CMA & [facility] LPN

## 2024-05-01 ENCOUNTER — Ambulatory Visit: Admitting: Urology

## 2024-05-01 ENCOUNTER — Encounter: Payer: Self-pay | Admitting: Urology

## 2024-05-01 DIAGNOSIS — C670 Malignant neoplasm of trigone of bladder: Secondary | ICD-10-CM | POA: Diagnosis not present

## 2024-05-01 DIAGNOSIS — Z5112 Encounter for antineoplastic immunotherapy: Secondary | ICD-10-CM | POA: Diagnosis not present

## 2024-05-01 LAB — MICROSCOPIC EXAMINATION
RBC, Urine: >30 /HPF — AB (ref 0–2)
WBC, UA: >30 /HPF — AB (ref 0–5)

## 2024-05-01 LAB — URINALYSIS, ROUTINE W REFLEX MICROSCOPIC
Bilirubin, UA: NEGATIVE
Glucose, UA: NEGATIVE
Ketones, UA: NEGATIVE
Nitrite, UA: NEGATIVE
Specific Gravity, UA: 1.02 (ref 1.005–1.030)
Urobilinogen, Ur: 0.2 mg/dL (ref 0.2–1.0)
pH, UA: 6 (ref 5.0–7.5)

## 2024-05-01 MED ORDER — BCG LIVE 50 MG IS SUSR
3.2400 mL | Freq: Once | INTRAVESICAL | Status: AC
  Administered 2024-05-01: 81 mg via INTRAVESICAL

## 2024-05-01 NOTE — Progress Notes (Signed)
 Assessment: 1. Malignant neoplasm of trigone of urinary bladder (HCC)   2. Encounter for antineoplastic immunotherapy     Plan: Maintenance BCG #3/3 given today Will plan on stent removal in 3-4 weeks  Chief Complaint: Chief Complaint  Patient presents with   BCG    HPI: Dominic Ray is a 68 y.o. male who presents for continued evaluation of  high risk nonmuscle invasive bladder cancer.  Patient also has BPH with large gland on combination medical therapy with tamsulosin  and finasteride . Patient reports clinically that he is doing quite well.  Stable lower urinary tract symptoms without recent gross hematuria.   See my note 06/14/2023 at time of initial visit for detailed history. Presented initially with worsening LUTS and gross hematuria.   PSA 06/2023=  3.1 Prostate volume (US )= 71ml   Heme eval-- Office cysto demonstrated bladder tumor CT heme protocol 06/20/2023-bladder wall thickening concerning for bladder cancer without evidence of metastatic disease or upper tract abnormalities.   Bladder cancer summary: Status post TURBT 07/18/2023-pathology high-grade T1-muscularis propria present but uninvolved--large area of tumor involvement trigone, right floor/lateral wall and around bladder neck. RUO not identified.  Left UO seen   Status post repeat TURBT 08/22/2023-pathology high-grade TA/question focal T1.  Muscularis propria present but uninvolved.  Comment was made of focal inverted growth pattern which could suggest potentially an aggressive phenotype. RUO again not visualized.  Left UO transected and wide open with clear efflux   Renal US  09/2023-- mild-mod bilateral hydro and marked bladder wall thickening Lab:  09/2023-- CR 0.94   CT-hematuria protocol 10/07/2023-mild to moderate bilateral hydro to the level of the bladder with contrast seen entering bladder.   Given the bilateral hydro and concern for worsening obstruction with potential inflammation with BCG treatment  the patient was taken back to the operating room on 10/25/2023. At that time I was able to resect both trigone areas and identify both ureteral openings which were subsequently stented.  Unfortunately and concerning there was evidence of significant regrowth of tumor in the prior interval involving the right trigone and bladder neck region.   Pathology demonstrates persistent T1 TCC-high-grade.   Patient completed induction BCG 01/2024. He was taken back to the operating room on 02/27/2024.  He underwent cystoscopy with bilateral ureteral stent exchange and transurethral resection of bladder tumor.  There was some shaggy urothelium on the trigone and bladder neck region. Pathology showed benign urothelium with polypoid cystitis and chronic inflammation without evidence of malignancy.  He presents today for maintenance BCG # 3/3.   Urine culture from 04/17/24 showed no growth.  No problems following his last 2 treatments.  Portions of the above documentation were copied from a prior visit for review purposes only.  Allergies: Allergies  Allergen Reactions   [name] [Lidocaine ] Rash    Anything ending in (-caine) per patient  Childhood allergy    PMH: Past Medical History:  Diagnosis Date   Basal cell carcinoma of nasal tip 08/24/2012   Bladder cancer (HCC) 2024   Follows w/ Dr. Barkley Boot, Urology.   Coronary artery disease    a. Abnl stress test -> s/p CABGx4 in 04/2017 with LIMA-LAD, SVG-diag, SVG-AM, SVG-PDA.   Heart murmur    Hyperlipidemia    Follows w/ Dr. Audery Blazing, cardiology.   Male hypogonadism 08/24/2012   Onychomycosis 08/24/2012   Pneumonia 1967    PSH: Past Surgical History:  Procedure Laterality Date   CARDIOVASCULAR STRESS TEST  04/2017   COLONOSCOPY  12/06/2022   one  descending colon polyp   CORONARY ARTERY BYPASS GRAFT N/A 05/09/2017   Procedure: CORONARY ARTERY BYPASS GRAFTING times four using left internal mammary artery and right leg saphenous vein  ;   Surgeon: Bartley Lightning, MD;  Location: [facility];  Service: Open Heart Surgery;  Laterality: N/A;   CYSTOSCOPY WITH RETROGRADE PYELOGRAM, URETEROSCOPY AND STENT PLACEMENT Bilateral 10/25/2023   Procedure: CYSTOSCOPY WITH RETROGRADE PYELOGRAM, URETEROSCOPY AND STENT PLACEMENT;  Surgeon: Scarlet Curly, MD;  Location: [facility];  Service: Urology;  Laterality: Bilateral;   CYSTOSCOPY WITH STENT PLACEMENT Bilateral 02/27/2024   Procedure: CYSTOSCOPY, WITH STENT INSERTION;  Surgeon: Scarlet Curly, MD;  Location: [facility];  Service: Urology;  Laterality: Bilateral;   LEFT HEART CATH AND CORONARY ANGIOGRAPHY N/A 05/08/2017   Procedure: Left Heart Cath and Coronary Angiography;  Surgeon: Millicent Ally, MD;  Location: [facility] INVASIVE CV LAB;  Service: Cardiovascular;  Laterality: N/A;   TEE WITHOUT CARDIOVERSION N/A 05/09/2017   Procedure: TRANSESOPHAGEAL ECHOCARDIOGRAM (TEE);  Surgeon: Bartley Lightning, MD;  Location: [facility];  Service: Open Heart Surgery;  Laterality: N/A;   TONSILLECTOMY  1968   TRANSURETHRAL RESECTION OF BLADDER TUMOR Bilateral 07/18/2023   Procedure: TRANSURETHRAL RESECTION OF BLADDER TUMOR (TURBT);  Surgeon: Scarlet Curly, MD;  Location: [facility];  Service: Urology;  Laterality: Bilateral;   TRANSURETHRAL RESECTION OF BLADDER TUMOR Bilateral 08/22/2023   Procedure: TRANSURETHRAL RESECTION OF BLADDER TUMOR (TURBT);  Surgeon: Scarlet Curly, MD;  Location: [facility];  Service: Urology;  Laterality: Bilateral;   TRANSURETHRAL RESECTION OF BLADDER TUMOR N/A 10/25/2023   Procedure: TRANSURETHRAL RESECTION OF BLADDER TUMOR (TURBT);  Surgeon: Scarlet Curly, MD;  Location: [facility];  Service: Urology;  Laterality: N/A;   TRANSURETHRAL RESECTION OF BLADDER TUMOR N/A 02/27/2024   Procedure: TURBT (TRANSURETHRAL RESECTION OF BLADDER TUMOR);  Surgeon: Scarlet Curly, MD;  Location: [facility];  Service: Urology;  Laterality: N/A;    SH: Social History   Tobacco  Use   Smoking status: Never   Smokeless tobacco: Never  Vaping Use   Vaping status: Never Used  Substance Use Topics   Alcohol use: Yes    Comment: Occasional   Drug use: No    ROS: Constitutional:  Negative for fever, chills, weight loss CV: Negative for chest pain, previous MI, hypertension Respiratory:  Negative for shortness of breath, wheezing, sleep apnea, frequent cough GI:  Negative for nausea, vomiting, bloody stool, GERD  PE: GENERAL APPEARANCE:  Well appearing, well developed, well nourished, NAD   Results: U/A: >30 WBC, >30 RBC, mod bacteria, nitrite neg

## 2024-05-01 NOTE — Progress Notes (Signed)
 BCG Bladder Instillation  BCG # 3  Due to Bladder Cancer patient is present today for a BCG treatment. Patient was cleaned and prepped in a sterile fashion with betadine. A 14FR catheter was inserted, urine return was noted , urine was yellow in color.  50ml of reconstituted BCG was instilled into the bladder. The catheter was then removed. Patient tolerated well, no complications were noted  Performed by: C. Candee Cha, LPN  Follow up/ Additional notes: Pt made cysto appt for July

## 2024-05-28 ENCOUNTER — Encounter: Payer: Self-pay | Admitting: Urology

## 2024-05-28 ENCOUNTER — Ambulatory Visit: Admitting: Urology

## 2024-05-28 VITALS — BP 144/74 | HR 62 | Ht 68.0 in | Wt 200.0 lb

## 2024-05-28 DIAGNOSIS — Z466 Encounter for fitting and adjustment of urinary device: Secondary | ICD-10-CM

## 2024-05-28 DIAGNOSIS — N1330 Unspecified hydronephrosis: Secondary | ICD-10-CM | POA: Diagnosis not present

## 2024-05-28 DIAGNOSIS — C670 Malignant neoplasm of trigone of bladder: Secondary | ICD-10-CM

## 2024-05-28 LAB — URINALYSIS, ROUTINE W REFLEX MICROSCOPIC
Bilirubin, UA: NEGATIVE
Glucose, UA: NEGATIVE
Ketones, UA: NEGATIVE
Nitrite, UA: NEGATIVE
Specific Gravity, UA: 1.025 (ref 1.005–1.030)
Urobilinogen, Ur: 0.2 mg/dL (ref 0.2–1.0)
pH, UA: 5.5 (ref 5.0–7.5)

## 2024-05-28 LAB — MICROSCOPIC EXAMINATION
RBC, Urine: >30 /HPF — AB (ref 0–2)
WBC, UA: >30 /HPF — AB (ref 0–5)

## 2024-05-28 MED ORDER — CIPROFLOXACIN HCL 500 MG PO TABS
500.0000 mg | ORAL_TABLET | Freq: Once | ORAL | Status: AC
  Administered 2024-05-28: 500 mg via ORAL

## 2024-05-28 NOTE — Progress Notes (Signed)
 Assessment: 1. Malignant neoplasm of trigone of urinary bladder (HCC)   2. Bilateral hydronephrosis     Plan: Bilateral ureteral stents removed today. Cipro  x 1 following stent removal. Return to office in 6 weeks for cystoscopy Will need maintenance BCG shortly after next cystoscopy  Chief Complaint: Chief Complaint  Patient presents with   Bladder Cancer    HPI: Dominic Ray is a 68 y.o. male who presents for continued evaluation of  high risk nonmuscle invasive bladder cancer.  Patient also has BPH with large gland on combination medical therapy with tamsulosin  and finasteride . Patient reports clinically that he is doing quite well.  Stable lower urinary tract symptoms without recent gross hematuria.   See my note 06/14/2023 at time of initial visit for detailed history. Presented initially with worsening LUTS and gross hematuria.   PSA 06/2023=  3.1 Prostate volume (US )= 71ml   Heme eval-- Office cysto demonstrated bladder tumor CT heme protocol 06/20/2023-bladder wall thickening concerning for bladder cancer without evidence of metastatic disease or upper tract abnormalities.   Bladder cancer summary: Status post TURBT 07/18/2023-pathology high-grade T1-muscularis propria present but uninvolved--large area of tumor involvement trigone, right floor/lateral wall and around bladder neck. RUO not identified.  Left UO seen   Status post repeat TURBT 08/22/2023-pathology high-grade TA/question focal T1.  Muscularis propria present but uninvolved.  Comment was made of focal inverted growth pattern which could suggest potentially an aggressive phenotype. RUO again not visualized.  Left UO transected and wide open with clear efflux   Renal US  09/2023-- mild-mod bilateral hydro and marked bladder wall thickening Lab:  09/2023-- CR 0.94   CT-hematuria protocol 10/07/2023-mild to moderate bilateral hydro to the level of the bladder with contrast seen entering bladder.   Given the  bilateral hydro and concern for worsening obstruction with potential inflammation with BCG treatment the patient was taken back to the operating room on 10/25/2023. At that time I was able to resect both trigone areas and identify both ureteral openings which were subsequently stented.  Unfortunately and concerning there was evidence of significant regrowth of tumor in the prior interval involving the right trigone and bladder neck region.   Pathology demonstrates persistent T1 TCC-high-grade.   Patient completed induction BCG 01/2024. He was taken back to the operating room on 02/27/2024.  He underwent cystoscopy with bilateral ureteral stent exchange and transurethral resection of bladder tumor.  There was some shaggy urothelium on the trigone and bladder neck region. Pathology showed benign urothelium with polypoid cystitis and chronic inflammation without evidence of malignancy.  He completed his maintenance BCG on 05/01/24.  Urine culture from 04/17/24 showed no growth.  No problems following his last treatment. He presents today for cystoscopy and bilateral ureteral stent removal. He is tolerating the stents very well.  No gross hematuria.  Portions of the above documentation were copied from a prior visit for review purposes only.  Allergies: Allergies  Allergen Reactions   [name] [Lidocaine ] Rash    Anything ending in (-caine) per patient  Childhood allergy    PMH: Past Medical History:  Diagnosis Date   Basal cell carcinoma of nasal tip 08/24/2012   Bladder cancer (HCC) 2024   Follows w/ Dr. Layman Hurst, Urology.   Coronary artery disease    a. Abnl stress test -> s/p CABGx4 in 04/2017 with LIMA-LAD, SVG-diag, SVG-AM, SVG-PDA.   Heart murmur    Hyperlipidemia    Follows w/ Dr. Pietro, cardiology.   Male hypogonadism 08/24/2012   Onychomycosis  08/24/2012   Pneumonia 1967    PSH: Past Surgical History:  Procedure Laterality Date   CARDIOVASCULAR STRESS TEST  04/2017    COLONOSCOPY  12/06/2022   one descending colon polyp   CORONARY ARTERY BYPASS GRAFT N/A 05/09/2017   Procedure: CORONARY ARTERY BYPASS GRAFTING times four using left internal mammary artery and right leg saphenous vein  ;  Surgeon: Lucas Dorise POUR, MD;  Location: [facility];  Service: Open Heart Surgery;  Laterality: N/A;   CYSTOSCOPY WITH RETROGRADE PYELOGRAM, URETEROSCOPY AND STENT PLACEMENT Bilateral 10/25/2023   Procedure: CYSTOSCOPY WITH RETROGRADE PYELOGRAM, URETEROSCOPY AND STENT PLACEMENT;  Surgeon: Shona Layman BROCKS, MD;  Location: [facility];  Service: Urology;  Laterality: Bilateral;   CYSTOSCOPY WITH STENT PLACEMENT Bilateral 02/27/2024   Procedure: CYSTOSCOPY, WITH STENT INSERTION;  Surgeon: Shona Layman BROCKS, MD;  Location: [facility];  Service: Urology;  Laterality: Bilateral;   LEFT HEART CATH AND CORONARY ANGIOGRAPHY N/A 05/08/2017   Procedure: Left Heart Cath and Coronary Angiography;  Surgeon: Burnard Debby LABOR, MD;  Location: [facility] INVASIVE CV LAB;  Service: Cardiovascular;  Laterality: N/A;   TEE WITHOUT CARDIOVERSION N/A 05/09/2017   Procedure: TRANSESOPHAGEAL ECHOCARDIOGRAM (TEE);  Surgeon: Lucas Dorise POUR, MD;  Location: [facility] OR;  Service: Open Heart Surgery;  Laterality: N/A;   TONSILLECTOMY  1968   TRANSURETHRAL RESECTION OF BLADDER TUMOR Bilateral 07/18/2023   Procedure: TRANSURETHRAL RESECTION OF BLADDER TUMOR (TURBT);  Surgeon: Shona Layman BROCKS, MD;  Location: [facility];  Service: Urology;  Laterality: Bilateral;   TRANSURETHRAL RESECTION OF BLADDER TUMOR Bilateral 08/22/2023   Procedure: TRANSURETHRAL RESECTION OF BLADDER TUMOR (TURBT);  Surgeon: Shona Layman BROCKS, MD;  Location: [facility];  Service: Urology;  Laterality: Bilateral;   TRANSURETHRAL RESECTION OF BLADDER TUMOR N/A 10/25/2023   Procedure: TRANSURETHRAL RESECTION OF BLADDER TUMOR (TURBT);  Surgeon: Shona Layman BROCKS, MD;  Location: [facility];  Service: Urology;  Laterality: N/A;   TRANSURETHRAL  RESECTION OF BLADDER TUMOR N/A 02/27/2024   Procedure: TURBT (TRANSURETHRAL RESECTION OF BLADDER TUMOR);  Surgeon: Shona Layman BROCKS, MD;  Location: [facility];  Service: Urology;  Laterality: N/A;    SH: Social History   Tobacco Use   Smoking status: Never   Smokeless tobacco: Never  Vaping Use   Vaping status: Never Used  Substance Use Topics   Alcohol use: Yes    Comment: Occasional   Drug use: No    ROS: Constitutional:  Negative for fever, chills, weight loss CV: Negative for chest pain, previous MI, hypertension Respiratory:  Negative for shortness of breath, wheezing, sleep apnea, frequent cough GI:  Negative for nausea, vomiting, bloody stool, GERD  PE: BP (!) 144/74   Pulse 62   Ht 5' 8 (1.727 m)   Wt 200 lb (90.7 kg)   BMI 30.41 kg/m  GENERAL APPEARANCE:  Well appearing, well developed, well nourished, NAD HEENT:  Atraumatic, normocephalic, oropharynx clear NECK:  Supple without lymphadenopathy or thyromegaly ABDOMEN:  Soft, non-tender, no masses EXTREMITIES:  Moves all extremities well, without clubbing, cyanosis, or edema NEUROLOGIC:  Alert and oriented x 3, normal gait, CN II-XII grossly intact MENTAL STATUS:  appropriate BACK:  Non-tender to palpation, No CVAT SKIN:  Warm, dry, and intact   Results: U/A:>30 WBCs, >30 RBCs, moderate bacteria, nitrite negative  Procedure:  Flexible Cystourethroscopy/Bilateral ureteral stent removal  Pre-operative Diagnosis: Bladder cancer, bilateral hydronephrosis  Post-operative Diagnosis: Same  Anesthesia:  local with lidocaine  jelly  Surgical Narrative:  After appropriate informed consent was  obtained, the patient was prepped and draped in the usual sterile fashion in the supine position.  The patient was correctly identified and the proper procedure delineated prior to proceeding.  Sterile lidocaine  gel was instilled in the urethra. The flexible cystoscope was introduced without difficulty. Bilateral ureteral stents  removed using stent graspers. The cystoscope was then removed.  The patient tolerated the procedure well.

## 2024-06-13 DIAGNOSIS — H524 Presbyopia: Secondary | ICD-10-CM | POA: Diagnosis not present

## 2024-06-13 DIAGNOSIS — H5203 Hypermetropia, bilateral: Secondary | ICD-10-CM | POA: Diagnosis not present

## 2024-06-13 DIAGNOSIS — H52223 Regular astigmatism, bilateral: Secondary | ICD-10-CM | POA: Diagnosis not present

## 2024-07-01 ENCOUNTER — Other Ambulatory Visit: Payer: Self-pay | Admitting: Sports Medicine

## 2024-07-01 ENCOUNTER — Other Ambulatory Visit: Payer: Self-pay | Admitting: Cardiology

## 2024-07-01 DIAGNOSIS — I10 Essential (primary) hypertension: Secondary | ICD-10-CM

## 2024-07-10 ENCOUNTER — Encounter: Payer: Self-pay | Admitting: Urology

## 2024-07-10 ENCOUNTER — Ambulatory Visit (INDEPENDENT_AMBULATORY_CARE_PROVIDER_SITE_OTHER): Payer: Self-pay | Admitting: Urology

## 2024-07-10 VITALS — BP 164/83 | HR 59 | Ht 68.0 in | Wt 205.0 lb

## 2024-07-10 DIAGNOSIS — N1330 Unspecified hydronephrosis: Secondary | ICD-10-CM | POA: Insufficient documentation

## 2024-07-10 DIAGNOSIS — C670 Malignant neoplasm of trigone of bladder: Secondary | ICD-10-CM | POA: Diagnosis not present

## 2024-07-10 DIAGNOSIS — N138 Other obstructive and reflux uropathy: Secondary | ICD-10-CM | POA: Insufficient documentation

## 2024-07-10 LAB — URINALYSIS, ROUTINE W REFLEX MICROSCOPIC
Bilirubin, UA: NEGATIVE
Glucose, UA: NEGATIVE
Ketones, UA: NEGATIVE
Leukocytes,UA: NEGATIVE
Nitrite, UA: NEGATIVE
Protein,UA: NEGATIVE
Specific Gravity, UA: 1.01 (ref 1.005–1.030)
Urobilinogen, Ur: 0.2 mg/dL (ref 0.2–1.0)
pH, UA: 6.5 (ref 5.0–7.5)

## 2024-07-10 LAB — MICROSCOPIC EXAMINATION

## 2024-07-10 MED ORDER — CIPROFLOXACIN HCL 500 MG PO TABS
500.0000 mg | ORAL_TABLET | Freq: Once | ORAL | Status: AC
  Administered 2024-07-10: 500 mg via ORAL

## 2024-07-10 NOTE — Addendum Note (Signed)
 Addended by: OBADIAH ROSELEE RAMAN on: 07/10/2024 10:48 AM   Modules accepted: Orders

## 2024-07-10 NOTE — Progress Notes (Signed)
 Assessment: 1. Malignant neoplasm of trigone of urinary bladder (HCC)   2. Bilateral hydronephrosis   3. BPH with obstruction/lower urinary tract symptoms     Plan: Urine cytology sent today Cipro  x 1 following cystoscopy Schedule for maintenance BCG weekly x 3 weeks in next 3-4 weeks Schedule for renal U/S Continue tamsulosin  and finasteride  for management of BPH.  Chief Complaint: Chief Complaint  Patient presents with   Bladder Cancer    HPI: Dominic Ray is a 68 y.o. male who presents for continued evaluation of  high risk nonmuscle invasive bladder cancer.  Patient also has BPH with large gland on combination medical therapy with tamsulosin  and finasteride .   See my note 06/14/2023 at time of initial visit for detailed history. Presented initially with worsening LUTS and gross hematuria.   PSA 06/2023=  3.1 Prostate volume (US )= 71ml   Heme eval-- Office cysto demonstrated bladder tumor CT heme protocol 06/20/2023-bladder wall thickening concerning for bladder cancer without evidence of metastatic disease or upper tract abnormalities.   Bladder cancer summary: Status post TURBT 07/18/2023-pathology high-grade T1-muscularis propria present but uninvolved--large area of tumor involvement trigone, right floor/lateral wall and around bladder neck. RUO not identified.  Left UO seen   Status post repeat TURBT 08/22/2023-pathology high-grade TA/question focal T1.  Muscularis propria present but uninvolved.  Comment was made of focal inverted growth pattern which could suggest potentially an aggressive phenotype. RUO again not visualized.  Left UO transected and wide open with clear efflux   Renal US  09/2023-- mild-mod bilateral hydro and marked bladder wall thickening Lab:  09/2023-- CR 0.94   CT-hematuria protocol 10/07/2023-mild to moderate bilateral hydro to the level of the bladder with contrast seen entering bladder.   Given the bilateral hydro and concern for worsening  obstruction with potential inflammation with BCG treatment the patient was taken back to the operating room on 10/25/2023. At that time I was able to resect both trigone areas and identify both ureteral openings which were subsequently stented.  Unfortunately and concerning there was evidence of significant regrowth of tumor in the prior interval involving the right trigone and bladder neck region.   Pathology demonstrates persistent T1 TCC-high-grade.   Patient completed induction BCG 01/2024. He was taken back to the operating room on 02/27/2024.  He underwent cystoscopy with bilateral ureteral stent exchange and transurethral resection of bladder tumor.  There was some shaggy urothelium on the trigone and bladder neck region. Pathology showed benign urothelium with polypoid cystitis and chronic inflammation without evidence of malignancy.  He completed his maintenance BCG on 05/01/24.  Urine culture from 04/17/24 showed no growth.  No problems following his last treatment. His stents were removed on 05/28/24.  He returns today for surveillance cystoscopy.  He reports that he has been doing very well following stent removal.  He has not had any gross hematuria or flank pain.  His lower urinary tract symptoms are stable.  He continues on tamsulosin  and finasteride .  Portions of the above documentation were copied from a prior visit for review purposes only.  Allergies: Allergies  Allergen Reactions   [name] [Lidocaine ] Rash    Anything ending in (-caine) per patient  Childhood allergy    PMH: Past Medical History:  Diagnosis Date   Basal cell carcinoma of nasal tip 08/24/2012   Bladder cancer (HCC) 2024   Follows w/ Dr. Layman Hurst, Urology.   Coronary artery disease    a. Abnl stress test -> s/p CABGx4 in 04/2017 with LIMA-LAD,  SVG-diag, SVG-AM, SVG-PDA.   Heart murmur    Hyperlipidemia    Follows w/ Dr. Pietro, cardiology.   Male hypogonadism 08/24/2012   Onychomycosis  08/24/2012   Pneumonia 1967    PSH: Past Surgical History:  Procedure Laterality Date   CARDIOVASCULAR STRESS TEST  04/2017   COLONOSCOPY  12/06/2022   one descending colon polyp   CORONARY ARTERY BYPASS GRAFT N/A 05/09/2017   Procedure: CORONARY ARTERY BYPASS GRAFTING times four using left internal mammary artery and right leg saphenous vein  ;  Surgeon: Lucas Dorise POUR, MD;  Location: [facility] OR;  Service: Open Heart Surgery;  Laterality: N/A;   CYSTOSCOPY WITH RETROGRADE PYELOGRAM, URETEROSCOPY AND STENT PLACEMENT Bilateral 10/25/2023   Procedure: CYSTOSCOPY WITH RETROGRADE PYELOGRAM, URETEROSCOPY AND STENT PLACEMENT;  Surgeon: Shona Layman BROCKS, MD;  Location: [facility];  Service: Urology;  Laterality: Bilateral;   CYSTOSCOPY WITH STENT PLACEMENT Bilateral 02/27/2024   Procedure: CYSTOSCOPY, WITH STENT INSERTION;  Surgeon: Shona Layman BROCKS, MD;  Location: [facility];  Service: Urology;  Laterality: Bilateral;   LEFT HEART CATH AND CORONARY ANGIOGRAPHY N/A 05/08/2017   Procedure: Left Heart Cath and Coronary Angiography;  Surgeon: Burnard Debby LABOR, MD;  Location: [facility] INVASIVE CV LAB;  Service: Cardiovascular;  Laterality: N/A;   TEE WITHOUT CARDIOVERSION N/A 05/09/2017   Procedure: TRANSESOPHAGEAL ECHOCARDIOGRAM (TEE);  Surgeon: Lucas Dorise POUR, MD;  Location: [facility] OR;  Service: Open Heart Surgery;  Laterality: N/A;   TONSILLECTOMY  1968   TRANSURETHRAL RESECTION OF BLADDER TUMOR Bilateral 07/18/2023   Procedure: TRANSURETHRAL RESECTION OF BLADDER TUMOR (TURBT);  Surgeon: Shona Layman BROCKS, MD;  Location: [facility];  Service: Urology;  Laterality: Bilateral;   TRANSURETHRAL RESECTION OF BLADDER TUMOR Bilateral 08/22/2023   Procedure: TRANSURETHRAL RESECTION OF BLADDER TUMOR (TURBT);  Surgeon: Shona Layman BROCKS, MD;  Location: [facility];  Service: Urology;  Laterality: Bilateral;   TRANSURETHRAL RESECTION OF BLADDER TUMOR N/A 10/25/2023   Procedure: TRANSURETHRAL RESECTION OF BLADDER TUMOR  (TURBT);  Surgeon: Shona Layman BROCKS, MD;  Location: [facility];  Service: Urology;  Laterality: N/A;   TRANSURETHRAL RESECTION OF BLADDER TUMOR N/A 02/27/2024   Procedure: TURBT (TRANSURETHRAL RESECTION OF BLADDER TUMOR);  Surgeon: Shona Layman BROCKS, MD;  Location: [facility];  Service: Urology;  Laterality: N/A;    SH: Social History   Tobacco Use   Smoking status: Never   Smokeless tobacco: Never  Vaping Use   Vaping status: Never Used  Substance Use Topics   Alcohol use: Yes    Comment: Occasional   Drug use: No    ROS: Constitutional:  Negative for fever, chills, weight loss CV: Negative for chest pain, previous MI, hypertension Respiratory:  Negative for shortness of breath, wheezing, sleep apnea, frequent cough GI:  Negative for nausea, vomiting, bloody stool, GERD  PE: BP (!) 164/83   Pulse (!) 59   Ht 5' 8 (1.727 m)   Wt 205 lb (93 kg)   BMI 31.17 kg/m  GENERAL APPEARANCE:  Well appearing, well developed, well nourished, NAD HEENT:  Atraumatic, normocephalic, oropharynx clear NECK:  Supple without lymphadenopathy or thyromegaly ABDOMEN:  Soft, non-tender, no masses EXTREMITIES:  Moves all extremities well, without clubbing, cyanosis, or edema NEUROLOGIC:  Alert and oriented x 3, normal gait, CN II-XII grossly intact MENTAL STATUS:  appropriate BACK:  Non-tender to palpation, No CVAT SKIN:  Warm, dry, and intact   Results: U/A: 0-5 WBCs, 0-2 RBCs  Procedure:  Flexible Cystourethroscopy  Pre-operative Diagnosis: Bladder cancer  surveillance  Post-operative Diagnosis: Bladder cancer surveillance  Anesthesia:  local with lidocaine  jelly  Surgical Narrative:  After appropriate informed consent was obtained, the patient was prepped and draped in the usual sterile fashion in the supine position.  The patient was correctly identified and the proper procedure delineated prior to proceeding.  Sterile lidocaine  gel was instilled in the urethra. The  flexible cystoscope was introduced without difficulty.  Findings:  Anterior urethra: Normal  Posterior urethra: Lateral lobe hypertrophy  Bladder:  several areas of increased mucosal vascularity on bladder base and posterior bladder wall; no papillary lesions seen; some evidence of chronic cystitis changes at bladder neck  Ureteral orifices: clear efflux seen  Additional findings: none  Saline bladder wash for cytology was performed.    The cystoscope was then removed.  The patient tolerated the procedure well.

## 2024-07-10 NOTE — Addendum Note (Signed)
 Addended by: ROSEANN ADINE PARAS on: 07/10/2024 10:46 AM   Modules accepted: Orders

## 2024-07-12 ENCOUNTER — Other Ambulatory Visit: Payer: Self-pay

## 2024-07-12 MED ORDER — ATORVASTATIN CALCIUM 80 MG PO TABS: 80.0000 mg | ORAL_TABLET | Freq: Every day | ORAL | 0 refills | Status: DC

## 2024-07-16 ENCOUNTER — Encounter: Payer: Self-pay | Admitting: Sports Medicine

## 2024-07-16 LAB — UROV MD:CYTOLOGY W/REFLEX FISH ATYPICAL CYTOLOGY

## 2024-07-17 ENCOUNTER — Encounter: Payer: Self-pay | Admitting: Urology

## 2024-07-19 ENCOUNTER — Ambulatory Visit (HOSPITAL_BASED_OUTPATIENT_CLINIC_OR_DEPARTMENT_OTHER)
Admission: RE | Admit: 2024-07-19 | Discharge: 2024-07-19 | Disposition: A | Discharge status: Home / Self Care | Source: Ambulatory Visit | Attending: Urology | Admitting: Urology

## 2024-07-19 DIAGNOSIS — N1330 Unspecified hydronephrosis: Secondary | ICD-10-CM | POA: Insufficient documentation

## 2024-07-25 NOTE — Progress Notes (Signed)
 HPI: FU CAD. Cardiac catheterization June 2018 showed a 70% D1, 90 mid LAD, 90% proximal right coronary artery followed by a 100% lesion and occluded circumflex. Ejection fraction 50-55%. Carotid Dopplers June 2018 with no significant stenosis. Patient subsequently underwent coronary artery bypassing graft with a LIMA  The LAD, saphenous pain graft to the diagonal, saphenous vein graft to the acute marginal and saphenous vein graft to the PDA.  Echocardiogram September 2024 showed normal LV function, grade 2 diastolic dysfunction, mild aortic insufficiency, mild aortic stenosis with mean gradient 14 mmHg.  Since last seen, patient denies dyspnea, chest pain, palpitations or syncope.  Current Outpatient Medications  Medication Sig Dispense Refill   amLODipine  (NORVASC ) 5 MG tablet TAKE 1 TABLET (5 MG TOTAL) BY MOUTH DAILY. 90 tablet 1   aspirin  EC 81 MG tablet Take 1 tablet (81 mg total) by mouth daily. (Patient taking differently: Take 81 mg by mouth every evening.) 90 tablet 3   atorvastatin  (LIPITOR ) 80 MG tablet Take 1 tablet (80 mg total) by mouth daily. 90 tablet 0   finasteride  (PROSCAR ) 5 MG tablet Take 1 tablet (5 mg total) by mouth daily. 90 tablet 3   metoprolol  succinate (TOPROL -XL) 25 MG 24 hr tablet TAKE 1/2 TABLET BY MOUTH EVERY DAY 15 tablet 0   tamsulosin  (FLOMAX ) 0.4 MG CAPS capsule TAKE 1 CAPSULE BY MOUTH EVERY DAY 90 capsule 3   No current facility-administered medications for this visit.     Past Medical History:  Diagnosis Date   Basal cell carcinoma of nasal tip 08/24/2012   Bladder cancer [facility]) 2024   Follows w/ Dr. Layman Hurst, Urology.   Coronary artery disease    a. Abnl stress test -> s/p CABGx4 in 04/2017 with LIMA-LAD, SVG-diag, SVG-AM, SVG-PDA.   Heart murmur    Hyperlipidemia    Follows w/ Dr. Pietro, cardiology.   Male hypogonadism 08/24/2012   Onychomycosis 08/24/2012   Pneumonia 1967    Past Surgical History:  Procedure Laterality Date    CARDIOVASCULAR STRESS TEST  04/2017   COLONOSCOPY  12/06/2022   one descending colon polyp   CORONARY ARTERY BYPASS GRAFT N/A 05/09/2017   Procedure: CORONARY ARTERY BYPASS GRAFTING times four using left internal mammary artery and right leg saphenous vein  ;  Surgeon: Lucas Dorise POUR, MD;  Location: [facility];  Service: Open Heart Surgery;  Laterality: N/A;   CYSTOSCOPY WITH RETROGRADE PYELOGRAM, URETEROSCOPY AND STENT PLACEMENT Bilateral 10/25/2023   Procedure: CYSTOSCOPY WITH RETROGRADE PYELOGRAM, URETEROSCOPY AND STENT PLACEMENT;  Surgeon: Hurst Layman BROCKS, MD;  Location: [facility];  Service: Urology;  Laterality: Bilateral;   CYSTOSCOPY WITH STENT PLACEMENT Bilateral 02/27/2024   Procedure: CYSTOSCOPY, WITH STENT INSERTION;  Surgeon: Hurst Layman BROCKS, MD;  Location: [facility];  Service: Urology;  Laterality: Bilateral;   LEFT HEART CATH AND CORONARY ANGIOGRAPHY N/A 05/08/2017   Procedure: Left Heart Cath and Coronary Angiography;  Surgeon: Burnard Debby LABOR, MD;  Location: [facility] INVASIVE CV LAB;  Service: Cardiovascular;  Laterality: N/A;   TEE WITHOUT CARDIOVERSION N/A 05/09/2017   Procedure: TRANSESOPHAGEAL ECHOCARDIOGRAM (TEE);  Surgeon: Lucas Dorise POUR, MD;  Location: [facility] OR;  Service: Open Heart Surgery;  Laterality: N/A;   TONSILLECTOMY  1968   TRANSURETHRAL RESECTION OF BLADDER TUMOR Bilateral 07/18/2023   Procedure: TRANSURETHRAL RESECTION OF BLADDER TUMOR (TURBT);  Surgeon: Hurst Layman BROCKS, MD;  Location: [facility];  Service: Urology;  Laterality: Bilateral;   TRANSURETHRAL RESECTION OF BLADDER TUMOR Bilateral 08/22/2023  Procedure: TRANSURETHRAL RESECTION OF BLADDER TUMOR (TURBT);  Surgeon: Shona Layman BROCKS, MD;  Location: [facility];  Service: Urology;  Laterality: Bilateral;   TRANSURETHRAL RESECTION OF BLADDER TUMOR N/A 10/25/2023   Procedure: TRANSURETHRAL RESECTION OF BLADDER TUMOR (TURBT);  Surgeon: Shona Layman BROCKS, MD;  Location[facility];  Service: Urology;   Laterality: N/A;   TRANSURETHRAL RESECTION OF BLADDER TUMOR N/A 02/27/2024   Procedure: TURBT (TRANSURETHRAL RESECTION OF BLADDER TUMOR);  Surgeon: Shona Layman BROCKS, MD;  Location: [facility];  Service: Urology;  Laterality: N/A;    Social History   Socioeconomic History   Marital status: Married    Spouse name: Sharman   Number of children: 0   Years of education: 16   Highest education level: Bachelor's degree (e.g., BA, AB, BS)  Occupational History    Comment: Unemployed   Occupation: Retired  Tobacco Use   Smoking status: Never   Smokeless tobacco: Never  Vaping Use   Vaping status: Never Used  Substance and Sexual Activity   Alcohol use: Yes    Comment: Occasional   Drug use: No   Sexual activity: Not on file  Other Topics Concern   Not on file  Social History Narrative   Lives with his wife. He enjoys playing pickle ball.   Social Drivers of Corporate investment banker Strain: Low Risk  (10/09/2023)   Overall Financial Resource Strain (CARDIA)    Difficulty of Paying Living Expenses: Not hard at all  Food Insecurity: No Food Insecurity (10/09/2023)   Hunger Vital Sign    Worried About Running Out of Food in the Last Year: Never true    Ran Out of Food in the Last Year: Never true  Transportation Needs: No Transportation Needs (10/09/2023)   PRAPARE - Administrator, Civil Service (Medical): No    Lack of Transportation (Non-Medical): No  Physical Activity: Sufficiently Active (10/09/2023)   Exercise Vital Sign    Days of Exercise per Week: 4 days    Minutes of Exercise per Session: 40 min  Stress: No Stress Concern Present (10/09/2023)   [employer] of Occupational Health - Occupational Stress Questionnaire    Feeling of Stress : Not at all  Social Connections: Socially Integrated (10/09/2023)   Social Connection and Isolation Panel    Frequency of Communication with Friends and Family: Three times a week    Frequency of Social Gatherings with  Friends and Family: Three times a week    Attends Religious Services: More than 4 times per year    Active Member of Clubs or Organizations: Yes    Attends Banker Meetings: More than 4 times per year    Marital Status: Married  Catering manager Violence: Not At Risk (10/09/2023)   Humiliation, Afraid, Rape, and Kick questionnaire    Fear of Current or Ex-Partner: No    Emotionally Abused: No    Physically Abused: No    Sexually Abused: No    Family History  Problem Relation Age of Onset   Cancer Mother    CAD Father        Died of MI at age 82   Breast cancer Maternal Grandfather    Colon cancer Neg Hx    Stomach cancer Neg Hx    Colon polyps Neg Hx    Crohn's disease Neg Hx    Esophageal cancer Neg Hx    Rectal cancer Neg Hx    Ulcerative colitis Neg Hx  ROS: no fevers or chills, productive cough, hemoptysis, dysphasia, odynophagia, melena, hematochezia, dysuria, hematuria, rash, seizure activity, orthopnea, PND, pedal edema, claudication. Remaining systems are negative.  Physical Exam: Well-developed well-nourished in no acute distress.  Skin is warm and dry.  HEENT is normal.  Neck is supple.  Chest is clear to auscultation with normal expansion.  Cardiovascular exam is regular rate and rhythm.  2/6 systolic murmur left sternal border. Abdominal exam nontender or distended. No masses palpated. Extremities show no edema. neuro grossly intact  EKG Interpretation Date/Time:  Monday July 29 2024 13:57:36 EDT Ventricular Rate:  67 PR Interval:  182 QRS Duration:  88 QT Interval:  358 QTC Calculation: 378 R Axis:   21  Text Interpretation: Normal sinus rhythm Normal ECG Confirmed by Pietro Rogue ([id number]) on 07/29/2024 2:06:18 PM    A/P  1 coronary artery disease status post coronary artery bypass and graft-patient denies chest pain.  Plan to continue medical therapy with aspirin  and statin.  2 aortic stenosis-mild on most recent  echocardiogram.  Will repeat study.  3 hyperlipidemia-continue statin.  4 hypertension-blood pressure controlled.  Continue present medical regimen.  Rogue Pietro, MD

## 2024-07-29 ENCOUNTER — Encounter: Payer: Self-pay | Admitting: Cardiology

## 2024-07-29 ENCOUNTER — Ambulatory Visit: Payer: Self-pay | Admitting: Urology

## 2024-07-29 ENCOUNTER — Ambulatory Visit: Admitting: Cardiology

## 2024-07-29 VITALS — BP 120/62 | HR 67 | Ht 68.0 in | Wt 206.0 lb

## 2024-07-29 DIAGNOSIS — I2510 Atherosclerotic heart disease of native coronary artery without angina pectoris: Secondary | ICD-10-CM

## 2024-07-29 DIAGNOSIS — E7800 Pure hypercholesterolemia, unspecified: Secondary | ICD-10-CM

## 2024-07-29 DIAGNOSIS — N1330 Unspecified hydronephrosis: Secondary | ICD-10-CM

## 2024-07-29 DIAGNOSIS — I359 Nonrheumatic aortic valve disorder, unspecified: Secondary | ICD-10-CM

## 2024-07-29 DIAGNOSIS — I10 Essential (primary) hypertension: Secondary | ICD-10-CM

## 2024-07-29 NOTE — Patient Instructions (Signed)
   Testing/Procedures:  Your physician has requested that you have an echocardiogram. Echocardiography is a painless test that uses sound waves to create images of your heart. It provides your doctor with information about the size and shape of your heart and how well your heart's chambers and valves are working. This procedure takes approximately one hour. There are no restrictions for this procedure. Please do NOT wear cologne, perfume, aftershave, or lotions (deodorant is allowed). Please arrive 15 minutes prior to your appointment time.  Please note: We ask at that you not bring children with you during ultrasound (echo/ vascular) testing. Due to room size and safety concerns, children are not allowed in the ultrasound rooms during exams. Our front office staff cannot provide observation of children in our lobby area while testing is being conducted. An adult accompanying a patient to their appointment will only be allowed in the ultrasound room at the discretion of the ultrasound technician under special circumstances. We apologize for any inconvenience. [facility] 1 ST FLOOR IMAGING DEPARTMENT  Follow-Up: At [facility], you and your health needs are our priority.  As part of our continuing mission to provide you with exceptional heart care, our providers are all part of one team.  This team includes your primary Cardiologist (physician) and Advanced Practice Providers or APPs (Physician Assistants and Nurse Practitioners) who all work together to provide you with the care you need, when you need it.  Your next appointment:   12 month(s)  Provider:   Alexandria Angel, MD

## 2024-07-30 ENCOUNTER — Ambulatory Visit (HOSPITAL_BASED_OUTPATIENT_CLINIC_OR_DEPARTMENT_OTHER)
Admission: RE | Admit: 2024-07-30 | Discharge: 2024-07-30 | Disposition: A | Discharge status: Home / Self Care | Source: Ambulatory Visit | Attending: Urology | Admitting: Urology

## 2024-07-30 ENCOUNTER — Encounter (HOSPITAL_BASED_OUTPATIENT_CLINIC_OR_DEPARTMENT_OTHER): Payer: Self-pay

## 2024-07-30 DIAGNOSIS — N1330 Unspecified hydronephrosis: Secondary | ICD-10-CM | POA: Insufficient documentation

## 2024-07-30 DIAGNOSIS — N281 Cyst of kidney, acquired: Secondary | ICD-10-CM | POA: Diagnosis not present

## 2024-07-30 DIAGNOSIS — K8020 Calculus of gallbladder without cholecystitis without obstruction: Secondary | ICD-10-CM | POA: Diagnosis not present

## 2024-07-30 DIAGNOSIS — N134 Hydroureter: Secondary | ICD-10-CM | POA: Diagnosis not present

## 2024-07-30 MED ORDER — IOHEXOL 300 MG/ML  SOLN
100.0000 mL | Freq: Once | INTRAMUSCULAR | Status: AC | PRN
  Administered 2024-07-30: 125 mL via INTRAVENOUS

## 2024-08-01 ENCOUNTER — Telehealth: Payer: Self-pay | Admitting: Urology

## 2024-08-01 ENCOUNTER — Ambulatory Visit: Payer: Self-pay | Admitting: Urology

## 2024-08-01 ENCOUNTER — Encounter: Payer: Self-pay | Admitting: Urology

## 2024-08-01 NOTE — Telephone Encounter (Signed)
 Dominic Ray called this am and lvm stated someone called him. I do not see any notes or documenting since the 15th when a my chart message was sent by Dr. Roseann. I called him back but got a voicemail.

## 2024-08-02 ENCOUNTER — Other Ambulatory Visit: Payer: Self-pay | Admitting: Cardiology

## 2024-08-02 ENCOUNTER — Other Ambulatory Visit: Payer: Self-pay | Admitting: Urology

## 2024-08-02 DIAGNOSIS — N1330 Unspecified hydronephrosis: Secondary | ICD-10-CM

## 2024-08-07 ENCOUNTER — Ambulatory Visit: Admitting: Urology

## 2024-08-08 ENCOUNTER — Encounter: Payer: Self-pay | Admitting: Urology

## 2024-08-08 ENCOUNTER — Ambulatory Visit: Admitting: Urology

## 2024-08-08 DIAGNOSIS — N1330 Unspecified hydronephrosis: Secondary | ICD-10-CM

## 2024-08-08 NOTE — Progress Notes (Addendum)
 Simple Catheter Placement  Due to renogram patient is present today for a foley cath placement.  Patient was cleaned and prepped in a sterile fashion with betadine and 2% lidocaine  jelly was instilled into the urethra. A 16 FR foley catheter was inserted, urine return was noted  , urine was yellow in color.  The balloon was filled with 10cc of sterile water .  A night bag was attached for drainage. Patient tolerated well, no complications were noted   Performed by: Crysta CMA

## 2024-08-09 ENCOUNTER — Ambulatory Visit (HOSPITAL_COMMUNITY)
Admission: RE | Admit: 2024-08-09 | Discharge: 2024-08-09 | Disposition: A | Discharge status: Home / Self Care | Source: Ambulatory Visit | Attending: Urology | Admitting: Urology

## 2024-08-09 DIAGNOSIS — H25813 Combined forms of age-related cataract, bilateral: Secondary | ICD-10-CM | POA: Diagnosis not present

## 2024-08-09 DIAGNOSIS — N1330 Unspecified hydronephrosis: Secondary | ICD-10-CM | POA: Diagnosis not present

## 2024-08-09 DIAGNOSIS — H43393 Other vitreous opacities, bilateral: Secondary | ICD-10-CM | POA: Diagnosis not present

## 2024-08-09 DIAGNOSIS — H43813 Vitreous degeneration, bilateral: Secondary | ICD-10-CM | POA: Diagnosis not present

## 2024-08-09 MED ORDER — FUROSEMIDE 10 MG/ML IJ SOLN
INTRAMUSCULAR | Status: AC
  Filled 2024-08-09: qty 8

## 2024-08-09 MED ORDER — FUROSEMIDE 10 MG/ML IJ SOLN
60.0000 mg | Freq: Once | INTRAMUSCULAR | Status: AC
  Administered 2024-08-09: 46.7 mg via INTRAVENOUS

## 2024-08-09 MED ORDER — TECHNETIUM TC 99M MERTIATIDE
5.1700 | Freq: Once | INTRAVENOUS | Status: AC
Start: 2024-08-09 — End: 2024-08-09
  Administered 2024-08-09: 5.17 via INTRAVENOUS

## 2024-08-13 NOTE — Progress Notes (Signed)
 The patient presents today for Foley catheter placement in preparation for a Lasix  renogram tomorrow. I discussed the purpose of the Foley catheter for the upcoming study. I reviewed the findings on his recent ultrasound and CT showing bilateral hydronephrosis.  I discussed the role of the renogram in further evaluation. He is not having any significant urinary symptoms at the present time.  He is not having any flank pain. Questions answered.

## 2024-08-14 ENCOUNTER — Ambulatory Visit: Payer: Self-pay | Admitting: Urology

## 2024-08-14 ENCOUNTER — Ambulatory Visit (HOSPITAL_COMMUNITY)
Admission: RE | Admit: 2024-08-14 | Discharge: 2024-08-14 | Disposition: A | Discharge status: Home / Self Care | Source: Ambulatory Visit | Attending: Cardiovascular Disease | Admitting: Cardiovascular Disease

## 2024-08-14 ENCOUNTER — Ambulatory Visit: Admitting: Physician Assistant

## 2024-08-14 ENCOUNTER — Encounter: Payer: Self-pay | Admitting: Physician Assistant

## 2024-08-14 ENCOUNTER — Ambulatory Visit: Admitting: Urology

## 2024-08-14 VITALS — BP 125/72 | HR 83 | Temp 98.5°F | Ht 68.0 in | Wt 209.0 lb

## 2024-08-14 DIAGNOSIS — I350 Nonrheumatic aortic (valve) stenosis: Secondary | ICD-10-CM

## 2024-08-14 DIAGNOSIS — I359 Nonrheumatic aortic valve disorder, unspecified: Secondary | ICD-10-CM

## 2024-08-14 DIAGNOSIS — L237 Allergic contact dermatitis due to plants, except food: Secondary | ICD-10-CM

## 2024-08-14 DIAGNOSIS — N1330 Unspecified hydronephrosis: Secondary | ICD-10-CM

## 2024-08-14 MED ORDER — HALOBETASOL PROPIONATE 0.05 % EX CREA: TOPICAL_CREAM | Freq: Two times a day (BID) | CUTANEOUS | 0 refills | Status: AC

## 2024-08-14 NOTE — Progress Notes (Signed)
 "  Acute Office Visit  Subjective:     Patient ID: Dominic Ray, male    DOB: [birth year], 68 y.o.   MRN: [id number]   HPI Discussed the use of AI scribe software for clinical note transcription with the patient, who gave verbal consent to proceed.  History of Present Illness Dominic Ray is a 68 year old male who presents with a severe itchy rash after cutting shrubs.  Pruritic rash - Severe pruritic rash developed 24 hours after cutting shrubs in front of his house - Rash primarily located on arms, chest, and neck - Rash has progressively worsened over the past five days - Initial use of calamine lotion without noticeable improvement - Applying cortisone cream for the last two days - No improvement with current topical treatments - No associated trouble breathing, tongue swelling, fever, or chills - Echocardiogram scheduled for this morning was canceled due to the rash, as the technicians were allergic to it  Cardiac symptoms and history - History of a loud heart murmur, described as becoming louder by his cardiologist - Status post quadruple coronary artery bypass grafting - No swelling, difficulty breathing, or orthopnea  Urologic and oncologic symptoms - Bladder cancer diagnosed 12 months ago - Scheduled for treatment last week, but postponed due to abnormal kidney findings - Recent tests showed enlarged kidneys, confirmed by ultrasound and CT scan - Underwent urogram last Friday and is awaiting results    ROS See HPI.     Objective:    BP 125/72   Pulse 83   Temp 98.5 F (36.9 C) (Oral)   Ht 5' 8 (1.727 m)   Wt 209 lb (94.8 kg)   SpO2 99%   BMI 31.78 kg/m  BP Readings from Last 3 Encounters:  08/14/24 125/72  07/29/24 120/62  07/10/24 (!) 164/83   Wt Readings from Last 3 Encounters:  08/14/24 209 lb (94.8 kg)  07/29/24 206 lb (93.4 kg)  07/10/24 205 lb (93 kg)      Physical Exam Constitutional:      Appearance: Normal appearance.  Cardiovascular:      Rate and Rhythm: Normal rate and regular rhythm.     Heart sounds: Murmur heard.     Comments: 4/6 SEM.  Pulmonary:     Effort: Pulmonary effort is normal.     Breath sounds: Normal breath sounds.  Skin:    Comments: Diffuse vesicular erythematous rash on bilateral arms, neck, face and hands. Some blisters over his left hand appear hemorrhagic.   Neurological:     General: No focal deficit present.     Mental Status: He is alert and oriented to person, place, and time.  Psychiatric:        Mood and Affect: Mood normal.          Assessment & Plan:  .Dominic Ray was seen today for rash.  Diagnoses and all orders for this visit:  Poison ivy dermatitis -     halobetasol  (ULTRAVATE ) 0.05 % cream; Apply topically 2 (two) times daily. -     methylPREDNISolone  sodium succinate (SOLU-MEDROL ) 125 mg/2 mL injection 125 mg  Mild aortic stenosis by prior echocardiogram   Assessment & Plan Allergic contact dermatitis due to plants Severe allergic contact dermatitis from likely poison ivy or oak, with itching and blistering on arms, chest, and neck. Rash worsened over five days. Intramuscular steroid preferred to avoid systemic effects. - Administer intramuscular steroid injection to reduce inflammation. Solumedrol 125mg  IM.  - Prescribe topical steroid cream, clobetasol,  twice daily to affected areas.  Heart murmur, likely aortic valve disease Loud heart murmur, likely aortic valve disease. Echocardiogram rescheduling needed due to prior cancellation. - Advise wearing compression stockings during air travel to reduce cardiac stress. - Reschedule echocardiogram to assess aortic valve function.  Bladder Cancer -managed by urology and oncology.     Kaylei Frink, PA-C   "

## 2024-08-14 NOTE — Patient Instructions (Signed)
Poison Ivy Dermatitis Poison ivy dermatitis is irritation and swelling (inflammation) of the skin caused by chemicals in the leaves of the poison ivy plant. The skin reaction often involves redness, blisters, and extreme itching. What are the causes? This condition is caused by a chemical (urushiol) found in the sap of the poison ivy plant. This chemical is sticky and can easily spread to people, animals, and objects. You can get poison ivy dermatitis by: Having direct contact with a poison ivy plant. Touching animals, other people, or objects that have come in contact with poison ivy and have the chemical on them. What increases the risk? This condition is more likely to develop in people who: Are outdoors often in wooded or marshy areas. Go outdoors without wearing protective clothing, such as closed shoes, long pants, and a long-sleeved shirt. What are the signs or symptoms? Symptoms of this condition include: Redness of the skin. Extreme itching. A rash that often includes bumps and blisters. The rash usually appears 48 hours after exposure, if you have been exposed before. If this is the first time you have been exposed, the rash may not appear until a week after exposure. Swelling. This may occur if the reaction is more severe. Symptoms usually last for 1-2 weeks. However, the first time you develop this condition, symptoms may last 3-4 weeks. How is this diagnosed? This condition may be diagnosed based on your symptoms and a physical exam. Your health care provider may also ask you about any recent outdoor activity. How is this treated? Treatment for this condition will vary depending on how severe it is. Treatment may include: Hydrocortisone cream or calamine lotion to relieve itching. Oatmeal baths to soothe the skin. Medicines, such as over-the-counter antihistamine tablets. Oral or injected steroid medicine, for more severe reactions. Follow these instructions at  home: Medicines Take or apply over-the-counter and prescription medicines only as told by your health care provider. Use hydrocortisone cream or calamine lotion as needed to soothe the skin and relieve itching. General instructions Do not scratch or rub your skin. Apply a cold, wet cloth (cold compress) to the affected areas or take baths in cool water. This will help with itching. Avoid hot baths and showers. Take oatmeal baths as needed. Use colloidal oatmeal. You can get this at your local pharmacy or grocery store. Follow the instructions on the packaging. Wash all clothes, bedsheets, towels, and blankets you were in contact with between your exposure and appearance of the rash. Check the affected area every day for signs of infection. Check for: More redness, swelling, or pain. Fluid or blood. Warmth. Pus or a bad smell. Keep all follow-up visits. Your health care provider may want to see how your skin is progressing with treatment. How is this prevented?  Learn to identify the poison ivy plant and avoid contact with the plant. This plant can be recognized by the number of leaves. Generally, poison ivy has three leaves with flowering branches on a single stem. The leaves are typically glossy, and they have jagged edges that come to a point. If you have been exposed to poison ivy, thoroughly wash with soap and water right away. You have about 30 minutes to remove the plant resin before it will cause the rash. Be sure to wash under your fingernails, because any plant resin there will continue to spread the rash. When hiking or camping, wear clothes that will help you to avoid skin exposure. This includes long pants, a long-sleeved shirt, long socks,   and hiking boots. You can also apply preventive lotion to your skin to help limit exposure. If you suspect that your clothes or outdoor gear came in contact with poison ivy, rinse them off outside with a garden hose before you bring them inside  your house. When doing yard work or gardening, wear gloves, long sleeves, long pants, and boots. Wash your garden tools and gloves if they come in contact with poison ivy. If you suspect that your pet has come into contact with poison ivy, wash them with pet shampoo and water. Make sure to wear gloves while washing your pet. Contact a health care provider if: You have open sores in the rash area. You have any signs of infection. You have redness that spreads beyond the rash area. You have a fever. You have a rash over a large area of your body. You have a rash on your eyes, mouth, or genitals. You have a rash that does not improve after a few weeks. Get help right away if: Your face swells or your eyes swell shut. You have trouble breathing. You have trouble swallowing. These symptoms may be an emergency. Get help right away. Call 911. Do not wait to see if the symptoms will go away. Do not drive yourself to the hospital. This information is not intended to replace advice given to you by your health care provider. Make sure you discuss any questions you have with your health care provider. Document Revised: 03/31/2022 Document Reviewed: 03/31/2022 Elsevier Patient Education  2024 [employer]  

## 2024-08-16 ENCOUNTER — Encounter: Payer: Self-pay | Admitting: Physician Assistant

## 2024-08-16 DIAGNOSIS — I350 Nonrheumatic aortic (valve) stenosis: Secondary | ICD-10-CM | POA: Insufficient documentation

## 2024-08-16 DIAGNOSIS — L237 Allergic contact dermatitis due to plants, except food: Secondary | ICD-10-CM

## 2024-08-16 MED ORDER — METHYLPREDNISOLONE SODIUM SUCC 125 MG IJ SOLR
125.0000 mg | Freq: Once | INTRAMUSCULAR | Status: AC
  Administered 2024-08-16: 125 mg via INTRAMUSCULAR

## 2024-08-21 ENCOUNTER — Ambulatory Visit: Admitting: Urology

## 2024-08-23 ENCOUNTER — Other Ambulatory Visit: Payer: Self-pay | Admitting: Urology

## 2024-08-26 ENCOUNTER — Other Ambulatory Visit (HOSPITAL_BASED_OUTPATIENT_CLINIC_OR_DEPARTMENT_OTHER)

## 2024-08-27 ENCOUNTER — Other Ambulatory Visit

## 2024-08-27 DIAGNOSIS — N1330 Unspecified hydronephrosis: Secondary | ICD-10-CM | POA: Diagnosis not present

## 2024-08-27 LAB — BASIC METABOLIC PANEL WITH GFR
BUN/Creatinine Ratio: 12 (ref 10–24)
BUN: 14 mg/dL (ref 8–27)
CO2: 25 mmol/L (ref 20–29)
Calcium: 8.9 mg/dL (ref 8.6–10.2)
Chloride: 102 mmol/L (ref 96–106)
Creatinine, Ser: 1.14 mg/dL (ref 0.76–1.27)
Glucose: 91 mg/dL (ref 70–99)
Potassium: 5.4 mmol/L — ABNORMAL HIGH (ref 3.5–5.2)
Sodium: 138 mmol/L (ref 134–144)
eGFR: 70 mL/min/1.73 (ref >59)

## 2024-08-28 ENCOUNTER — Ambulatory Visit: Payer: Self-pay | Admitting: Urology

## 2024-09-03 ENCOUNTER — Ambulatory Visit (HOSPITAL_BASED_OUTPATIENT_CLINIC_OR_DEPARTMENT_OTHER)
Admission: RE | Admit: 2024-09-03 | Discharge: 2024-09-03 | Disposition: A | Discharge status: Home / Self Care | Source: Ambulatory Visit | Attending: Cardiology | Admitting: Cardiology

## 2024-09-03 ENCOUNTER — Other Ambulatory Visit: Payer: Self-pay | Admitting: Urology

## 2024-09-03 ENCOUNTER — Ambulatory Visit: Payer: Self-pay | Admitting: Cardiology

## 2024-09-03 DIAGNOSIS — I359 Nonrheumatic aortic valve disorder, unspecified: Secondary | ICD-10-CM

## 2024-09-03 LAB — ECHOCARDIOGRAM COMPLETE
AR max vel: 1.52 cm2
AV Area VTI: 1.57 cm2
AV Area mean vel: 1.62 cm2
AV Mean grad: 16.7 mmHg
AV Peak grad: 28.9 mmHg
AV Vena cont: 0.4 cm
Ao pk vel: 2.69 m/s
Area-P 1/2: 2.84 cm2
Calc EF: 58.6 %
MV M vel: 4.51 m/s
MV Peak grad: 81.4 mmHg
S' Lateral: 3.4 cm
Single Plane A2C EF: 60.9 %
Single Plane A4C EF: 57.1 %

## 2024-09-03 MED ORDER — FINASTERIDE 5 MG PO TABS: 5.0000 mg | ORAL_TABLET | Freq: Every day | ORAL | 3 refills | Status: AC

## 2024-10-02 ENCOUNTER — Encounter: Payer: Self-pay | Admitting: Urology

## 2024-10-02 ENCOUNTER — Ambulatory Visit: Admitting: Urology

## 2024-10-02 VITALS — BP 153/78 | HR 65 | Ht 68.0 in | Wt 205.0 lb

## 2024-10-02 DIAGNOSIS — C670 Malignant neoplasm of trigone of bladder: Secondary | ICD-10-CM

## 2024-10-02 DIAGNOSIS — N138 Other obstructive and reflux uropathy: Secondary | ICD-10-CM

## 2024-10-02 DIAGNOSIS — N401 Enlarged prostate with lower urinary tract symptoms: Secondary | ICD-10-CM | POA: Diagnosis not present

## 2024-10-02 DIAGNOSIS — N1330 Unspecified hydronephrosis: Secondary | ICD-10-CM

## 2024-10-02 LAB — URINALYSIS, ROUTINE W REFLEX MICROSCOPIC
Bilirubin, UA: NEGATIVE
Glucose, UA: NEGATIVE
Ketones, UA: NEGATIVE
Leukocytes,UA: NEGATIVE
Nitrite, UA: NEGATIVE
RBC, UA: NEGATIVE
Specific Gravity, UA: 1.02 (ref 1.005–1.030)
Urobilinogen, Ur: 0.2 mg/dL (ref 0.2–1.0)
pH, UA: 6.5 (ref 5.0–7.5)

## 2024-10-02 MED ORDER — CIPROFLOXACIN HCL 500 MG PO TABS
500.0000 mg | ORAL_TABLET | Freq: Once | ORAL | Status: AC
  Administered 2024-10-02: 500 mg via ORAL

## 2024-10-02 NOTE — Progress Notes (Signed)
 Assessment: 1. 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 Bilateral hydronephrosis   3. BPH with obstruction/lower urinary tract symptoms     Plan: Urine cytology sent today Cipro  x 1 following cystoscopy Schedule for renal U/S for follow-up of bilateral hydronephrosis Continue tamsulosin  and finasteride  for management of BPH. Will call with results of cytology and ultrasound and to arrange follow-up. I have previously discussed with Dominic Ray the potential need for surgical management of the hydronephrosis secondary to distal ureteral obstruction.  Chief Complaint: Chief Complaint  Patient presents with   Bladder Cancer    HPI: Dominic Ray is a 68 y.o. male who presents for continued evaluation of  high risk nonmuscle invasive bladder cancer.  Patient also has BPH with large gland on combination medical therapy with tamsulosin  and finasteride .   See my note 06/14/2023 at time of initial visit for detailed history. Presented initially with worsening LUTS and gross hematuria.   PSA 06/2023=  3.1 Prostate volume (US )= 71ml   Heme eval-- Office cysto demonstrated bladder tumor CT heme protocol 06/20/2023-bladder wall thickening concerning for bladder cancer without evidence of metastatic disease or upper tract abnormalities.   Bladder cancer summary: Status post TURBT 07/18/2023-pathology high-grade T1-muscularis propria present but uninvolved--large area of tumor involvement trigone, right floor/lateral wall and around bladder neck. RUO not identified.  Left UO seen   Status post repeat TURBT 08/22/2023-pathology high-grade TA/question focal T1.  Muscularis propria present but uninvolved.  Comment was made of focal inverted growth pattern which could suggest potentially an aggressive phenotype. RUO again not visualized.  Left UO transected and wide open with clear efflux   Renal US  09/2023-- mild-mod bilateral hydro and marked bladder wall  thickening Lab:  09/2023-- CR 0.94   CT-hematuria protocol 10/07/2023-mild to moderate bilateral hydro to the level of the bladder with contrast seen entering bladder.   Given the bilateral hydro and concern for worsening obstruction with potential inflammation with BCG treatment the patient was taken back to the operating room on 10/25/2023. At that time I was able to resect both trigone areas and identify both ureteral openings which were subsequently stented.  Unfortunately and concerning there was evidence of significant regrowth of tumor in the prior interval involving the right trigone and bladder neck region.   Pathology demonstrates persistent T1 TCC-high-grade.   Patient completed induction BCG 01/2024. He was taken back to the operating room on 02/27/2024.  He underwent cystoscopy with bilateral ureteral stent exchange and transurethral resection of bladder tumor.  There was some shaggy urothelium on the trigone and bladder neck region. Pathology showed benign urothelium with polypoid cystitis and chronic inflammation without evidence of malignancy.  He completed his maintenance BCG on 05/01/24.  Urine culture from 04/17/24 showed no growth.  No problems following his last treatment. His stents were removed on 05/28/24. Surveillance cystoscopy in August 2025 did not show any evidence of recurrence in the bladder.  Urine cytology was negative for malignancy. He underwent evaluation with a renal ultrasound on 07/19/2024 for follow-up of the hydronephrosis.  This showed moderate right hydronephrosis with mild to moderate left hydronephrosis. CT hematuria study from 07/30/2024 showed mild irregular urinary bladder wall thickening and moderate to severe right hydronephrosis with mild left hydronephrosis. Lasix  renogram with Foley catheter in place from 9/26 showed split function of 50% bilaterally, T1 half of 14 minutes on the left and no T1 half on the right.  He returns today for surveillance  cystoscopy.  He is not having any significant lower urinary tract symptoms.  He continues on tamsulosin  and finasteride .  No dysuria or gross hematuria.  Portions of the above documentation were copied from a prior visit for review purposes only.  Allergies: Allergies  Allergen Reactions   [name] [Lidocaine ] Rash    Anything ending in (-caine) per patient  Childhood allergy    PMH: Past Medical History:  Diagnosis Date   Basal cell carcinoma of nasal tip 08/24/2012   Bladder cancer (HCC) 2024   Follows w/ Dr. Layman Hurst, Urology.   Coronary artery disease    a. Abnl stress test -> s/p CABGx4 in 04/2017 with LIMA-LAD, SVG-diag, SVG-AM, SVG-PDA.   Heart murmur    Hyperlipidemia    Follows w/ Dr. Pietro, cardiology.   Male hypogonadism 08/24/2012   Onychomycosis 08/24/2012   Pneumonia 1967    PSH: Past Surgical History:  Procedure Laterality Date   CARDIOVASCULAR STRESS TEST  04/2017   COLONOSCOPY  12/06/2022   one descending colon polyp   CORONARY ARTERY BYPASS GRAFT N/A 05/09/2017   Procedure: CORONARY ARTERY BYPASS GRAFTING times four using left internal mammary artery and right leg saphenous vein  ;  Surgeon: Lucas Dorise POUR, MD;  Location: [facility] OR;  Service: Open Heart Surgery;  Laterality: N/A;   CYSTOSCOPY WITH RETROGRADE PYELOGRAM, URETEROSCOPY AND STENT PLACEMENT Bilateral 10/25/2023   Procedure: CYSTOSCOPY WITH RETROGRADE PYELOGRAM, URETEROSCOPY AND STENT PLACEMENT;  Surgeon: Hurst Layman BROCKS, MD;  Location: [facility];  Service: Urology;  Laterality: Bilateral;   CYSTOSCOPY WITH STENT PLACEMENT Bilateral 02/27/2024   Procedure: CYSTOSCOPY, WITH STENT INSERTION;  Surgeon: Hurst Layman BROCKS, MD;  Location: [facility];  Service: Urology;  Laterality: Bilateral;   LEFT HEART CATH AND CORONARY ANGIOGRAPHY N/A 05/08/2017   Procedure: Left Heart Cath and Coronary Angiography;  Surgeon: Burnard Debby LABOR, MD;  Location: [facility] INVASIVE CV LAB;  Service: Cardiovascular;   Laterality: N/A;   TEE WITHOUT CARDIOVERSION N/A 05/09/2017   Procedure: TRANSESOPHAGEAL ECHOCARDIOGRAM (TEE);  Surgeon: Lucas Dorise POUR, MD;  Location: [facility] OR;  Service: Open Heart Surgery;  Laterality: N/A;   TONSILLECTOMY  1968   TRANSURETHRAL RESECTION OF BLADDER TUMOR Bilateral 07/18/2023   Procedure: TRANSURETHRAL RESECTION OF BLADDER TUMOR (TURBT);  Surgeon: Hurst Layman BROCKS, MD;  Location: [facility];  Service: Urology;  Laterality: Bilateral;   TRANSURETHRAL RESECTION OF BLADDER TUMOR Bilateral 08/22/2023   Procedure: TRANSURETHRAL RESECTION OF BLADDER TUMOR (TURBT);  Surgeon: Hurst Layman BROCKS, MD;  Location: [facility];  Service: Urology;  Laterality: Bilateral;   TRANSURETHRAL RESECTION OF BLADDER TUMOR N/A 10/25/2023   Procedure: TRANSURETHRAL RESECTION OF BLADDER TUMOR (TURBT);  Surgeon: Hurst Layman BROCKS, MD;  Location[facility];  Service: Urology;  Laterality: N/A;   TRANSURETHRAL RESECTION OF BLADDER TUMOR N/A 02/27/2024   Procedure: TURBT (TRANSURETHRAL RESECTION OF BLADDER TUMOR);  Surgeon: Hurst Layman BROCKS, MD;  Location: [facility];  Service: Urology;  Laterality: N/A;    SH: Social History   Tobacco Use   Smoking status: Never   Smokeless tobacco: Never  Vaping Use   Vaping status: Never Used  Substance Use Topics   Alcohol use: Yes    Comment: Occasional   Drug use: No    ROS: Constitutional:  Negative for fever, chills, weight loss CV: Negative for chest pain, previous MI, hypertension Respiratory:  Negative for shortness of breath, wheezing, sleep apnea, frequent cough GI:  Negative for nausea, vomiting, bloody stool, GERD  PE: BP ROLLEN)  153/78   Pulse 65   Ht 5' 8 (1.727 m)   Wt 205 lb (93 kg)   BMI 31.17 kg/m  GENERAL APPEARANCE:  Well appearing, well developed, well nourished, NAD HEENT:  Atraumatic, normocephalic, oropharynx clear NECK:  Supple without lymphadenopathy or thyromegaly ABDOMEN:  Soft, non-tender, no masses EXTREMITIES:  Moves  all extremities well, without clubbing, cyanosis, or edema NEUROLOGIC:  Alert and oriented x 3, normal gait, CN II-XII grossly intact MENTAL STATUS:  appropriate BACK:  Non-tender to palpation, No CVAT SKIN:  Warm, dry, and intact   Results: U/A: Negative  Procedure:  Flexible Cystourethroscopy  Pre-operative Diagnosis: Bladder cancer surveillance  Post-operative Diagnosis: Bladder cancer surveillance  Anesthesia:  local with lidocaine  jelly  Surgical Narrative:  After appropriate informed consent was obtained, the patient was prepped and draped in the usual sterile fashion in the supine position.  The patient was correctly identified and the proper procedure delineated prior to proceeding.  Sterile lidocaine  gel was instilled in the urethra. The flexible cystoscope was introduced without difficulty.  Findings:  Anterior urethra: Normal  Posterior urethra: Lateral lobe hypertrophy  Bladder: Several areas of increased vascularity noted on bladder base and posterior bladder wall; continued evidence of chronic cystitis changes at bladder neck and trigone; no papillary lesions seen.  Ureteral orifices: Unable to visualize due to some bleeding noted with passage of the cystoscope  Additional findings: none  Saline bladder wash for cytology was performed.    The cystoscope was then removed.  The patient tolerated the procedure well.

## 2024-10-09 ENCOUNTER — Encounter: Payer: Self-pay | Admitting: Urology

## 2024-10-09 ENCOUNTER — Ambulatory Visit: Payer: Self-pay | Admitting: Urology

## 2024-10-09 LAB — UROV MD:CYTOLOGY W/REFLEX FISH ATYPICAL CYTOLOGY

## 2024-10-13 ENCOUNTER — Other Ambulatory Visit: Payer: Self-pay | Admitting: Cardiology

## 2024-10-25 ENCOUNTER — Ambulatory Visit (HOSPITAL_BASED_OUTPATIENT_CLINIC_OR_DEPARTMENT_OTHER)
Admission: RE | Admit: 2024-10-25 | Discharge: 2024-10-25 | Disposition: A | Source: Ambulatory Visit | Attending: Urology | Admitting: Urology

## 2024-10-25 DIAGNOSIS — N1330 Unspecified hydronephrosis: Secondary | ICD-10-CM | POA: Diagnosis not present

## 2024-10-25 DIAGNOSIS — N281 Cyst of kidney, acquired: Secondary | ICD-10-CM | POA: Diagnosis not present

## 2024-11-12 ENCOUNTER — Telehealth: Payer: Self-pay

## 2024-11-12 NOTE — Telephone Encounter (Signed)
 Pt scheduled and confirmed 3 mBCG appts for January.

## 2024-11-12 NOTE — Telephone Encounter (Signed)
-----   Message from [place] B sent at 11/11/2024 10:25 AM EST -----  ----- Message ----- From: Roseann Adine PARAS., MD Sent: 11/10/2024  11:11 AM EST To: Madelin CHRISTELLA Edison  Please schedule for BCG weekly x 3 weeks.

## 2024-11-20 ENCOUNTER — Ambulatory Visit: Admitting: Urology

## 2024-11-20 ENCOUNTER — Encounter: Payer: Self-pay | Admitting: Urology

## 2024-11-20 DIAGNOSIS — N138 Other obstructive and reflux uropathy: Secondary | ICD-10-CM | POA: Diagnosis not present

## 2024-11-20 DIAGNOSIS — Z5112 Encounter for antineoplastic immunotherapy: Secondary | ICD-10-CM

## 2024-11-20 DIAGNOSIS — N1330 Unspecified hydronephrosis: Secondary | ICD-10-CM | POA: Diagnosis not present

## 2024-11-20 DIAGNOSIS — N401 Enlarged prostate with lower urinary tract symptoms: Secondary | ICD-10-CM

## 2024-11-20 DIAGNOSIS — C670 Malignant neoplasm of trigone of bladder: Secondary | ICD-10-CM | POA: Diagnosis not present

## 2024-11-20 LAB — MICROSCOPIC EXAMINATION

## 2024-11-20 LAB — URINALYSIS, ROUTINE W REFLEX MICROSCOPIC
Bilirubin, UA: NEGATIVE
Glucose, UA: NEGATIVE
Leukocytes,UA: NEGATIVE
Nitrite, UA: NEGATIVE
Specific Gravity, UA: 1.02 (ref 1.005–1.030)
Urobilinogen, Ur: 0.2 mg/dL (ref 0.2–1.0)
pH, UA: 5.5 (ref 5.0–7.5)

## 2024-11-20 MED ORDER — BCG LIVE 50 MG IS SUSR
3.2400 mL | Freq: Once | INTRAVESICAL | Status: AC
  Administered 2024-11-20: 81 mg via INTRAVESICAL

## 2024-11-20 NOTE — Progress Notes (Signed)
 "  Assessment: 1. 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 Bilateral hydronephrosis   3. BPH with obstruction/lower urinary tract symptoms   4. Encounter for antineoplastic immunotherapy     Plan: Maintenance BCG #1/3 given today Continue tamsulosin  and finasteride  for management of BPH. Return to office in 1 week for BCG #2/3  Chief Complaint: Chief Complaint  Patient presents with   Bladder Cancer    HPI: Dominic Ray is a 69 y.o. male who presents for continued evaluation of  high risk nonmuscle invasive bladder cancer.  Patient also has BPH with large gland on combination medical therapy with tamsulosin  and finasteride .   See my note 06/14/2023 at time of initial visit for detailed history. Presented initially with worsening LUTS and gross hematuria.   PSA 06/2023=  3.1 Prostate volume (US )= 71ml   Heme eval-- Office cysto demonstrated bladder tumor CT heme protocol 06/20/2023-bladder wall thickening concerning for bladder cancer without evidence of metastatic disease or upper tract abnormalities.   Bladder cancer summary: Status post TURBT 07/18/2023-pathology high-grade T1-muscularis propria present but uninvolved--large area of tumor involvement trigone, right floor/lateral wall and around bladder neck. RUO not identified.  Left UO seen   Status post repeat TURBT 08/22/2023-pathology high-grade TA/question focal T1.  Muscularis propria present but uninvolved.  Comment was made of focal inverted growth pattern which could suggest potentially an aggressive phenotype. RUO again not visualized.  Left UO transected and wide open with clear efflux   Renal US  09/2023-- mild-mod bilateral hydro and marked bladder wall thickening Lab:  09/2023-- CR 0.94   CT-hematuria protocol 10/07/2023-mild to moderate bilateral hydro to the level of the bladder with contrast seen entering bladder.   Given the bilateral hydro and concern for worsening  obstruction with potential inflammation with BCG treatment the patient was taken back to the operating room on 10/25/2023. At that time I was able to resect both trigone areas and identify both ureteral openings which were subsequently stented.  Unfortunately and concerning there was evidence of significant regrowth of tumor in the prior interval involving the right trigone and bladder neck region.   Pathology demonstrates persistent T1 TCC-high-grade.   Patient completed induction BCG 01/2024. He was taken back to the operating room on 02/27/2024.  He underwent cystoscopy with bilateral ureteral stent exchange and transurethral resection of bladder tumor.  There was some shaggy urothelium on the trigone and bladder neck region. Pathology showed benign urothelium with polypoid cystitis and chronic inflammation without evidence of malignancy.  He completed his maintenance BCG on 05/01/24.  Urine culture from 04/17/24 showed no growth.  No problems following his last treatment. His stents were removed on 05/28/24. Surveillance cystoscopy in August 2025 did not show any evidence of recurrence in the bladder.  Urine cytology was negative for malignancy. He underwent evaluation with a renal ultrasound on 07/19/2024 for follow-up of the hydronephrosis.  This showed moderate right hydronephrosis with mild to moderate left hydronephrosis. CT hematuria study from 07/30/2024 showed mild irregular urinary bladder wall thickening and moderate to severe right hydronephrosis with mild left hydronephrosis. Lasix  renogram with Foley catheter in place from 9/26 showed split function of 50% bilaterally, T1 half of 14 minutes on the left and no T1 half on the right. Surveillance cystoscopy from 10/02/2024 showed several areas of increased vascularity at the bladder base and posterior bladder wall, chronic cystitis at bladder neck and trigone, no papillary lesions seen. Urine cytology negative for malignancy.  Renal  ultrasound from 10/25/2024 showed moderate stable hydronephrosis on the right and stable mild hydronephrosis on the left.  He presents today for maintenance BCG #1/3.  Portions of the above documentation were copied from a prior visit for review purposes only.  Allergies: Allergies  Allergen Reactions   [name] [Lidocaine ] Rash    Anything ending in (-caine) per patient  Childhood allergy    PMH: Past Medical History:  Diagnosis Date   Basal cell carcinoma of nasal tip 08/24/2012   Bladder cancer (HCC) 2024   Follows w/ Dr. Layman Hurst, Urology.   Coronary artery disease    a. Abnl stress test -> s/p CABGx4 in 04/2017 with LIMA-LAD, SVG-diag, SVG-AM, SVG-PDA.   Heart murmur    Hyperlipidemia    Follows w/ Dr. Pietro, cardiology.   Male hypogonadism 08/24/2012   Onychomycosis 08/24/2012   Pneumonia 1967    PSH: Past Surgical History:  Procedure Laterality Date   CARDIOVASCULAR STRESS TEST  04/2017   COLONOSCOPY  12/06/2022   one descending colon polyp   CORONARY ARTERY BYPASS GRAFT N/A 05/09/2017   Procedure: CORONARY ARTERY BYPASS GRAFTING times four using left internal mammary artery and right leg saphenous vein  ;  Surgeon: Lucas Dorise POUR, MD;  Location: [facility] OR;  Service: Open Heart Surgery;  Laterality: N/A;   CYSTOSCOPY WITH RETROGRADE PYELOGRAM, URETEROSCOPY AND STENT PLACEMENT Bilateral 10/25/2023   Procedure: CYSTOSCOPY WITH RETROGRADE PYELOGRAM, URETEROSCOPY AND STENT PLACEMENT;  Surgeon: Hurst Layman BROCKS, MD;  Location: [facility];  Service: Urology;  Laterality: Bilateral;   CYSTOSCOPY WITH STENT PLACEMENT Bilateral 02/27/2024   Procedure: CYSTOSCOPY, WITH STENT INSERTION;  Surgeon: Hurst Layman BROCKS, MD;  Location: [facility];  Service: Urology;  Laterality: Bilateral;   LEFT HEART CATH AND CORONARY ANGIOGRAPHY N/A 05/08/2017   Procedure: Left Heart Cath and Coronary Angiography;  Surgeon: Burnard Debby LABOR, MD;  Location: [facility] INVASIVE CV LAB;  Service:  Cardiovascular;  Laterality: N/A;   TEE WITHOUT CARDIOVERSION N/A 05/09/2017   Procedure: TRANSESOPHAGEAL ECHOCARDIOGRAM (TEE);  Surgeon: Lucas Dorise POUR, MD;  Location: [facility] OR;  Service: Open Heart Surgery;  Laterality: N/A;   TONSILLECTOMY  1968   TRANSURETHRAL RESECTION OF BLADDER TUMOR Bilateral 07/18/2023   Procedure: TRANSURETHRAL RESECTION OF BLADDER TUMOR (TURBT);  Surgeon: Hurst Layman BROCKS, MD;  Location: [facility];  Service: Urology;  Laterality: Bilateral;   TRANSURETHRAL RESECTION OF BLADDER TUMOR Bilateral 08/22/2023   Procedure: TRANSURETHRAL RESECTION OF BLADDER TUMOR (TURBT);  Surgeon: Hurst Layman BROCKS, MD;  Location: [facility];  Service: Urology;  Laterality: Bilateral;   TRANSURETHRAL RESECTION OF BLADDER TUMOR N/A 10/25/2023   Procedure: TRANSURETHRAL RESECTION OF BLADDER TUMOR (TURBT);  Surgeon: Hurst Layman BROCKS, MD;  Location: [facility];  Service: Urology;  Laterality: N/A;   TRANSURETHRAL RESECTION OF BLADDER TUMOR N/A 02/27/2024   Procedure: TURBT (TRANSURETHRAL RESECTION OF BLADDER TUMOR);  Surgeon: Hurst Layman BROCKS, MD;  Location: [facility];  Service: Urology;  Laterality: N/A;    SH: Social History   Tobacco Use   Smoking status: Never   Smokeless tobacco: Never  Vaping Use   Vaping status: Never Used  Substance Use Topics   Alcohol use: Yes    Comment: Occasional   Drug use: No    ROS: Constitutional:  Negative for fever, chills, weight loss CV: Negative for chest pain, previous MI, hypertension Respiratory:  Negative for shortness of breath, wheezing, sleep apnea, frequent cough GI:  Negative for nausea, vomiting, bloody stool, GERD  PE: GENERAL APPEARANCE:  Well appearing, well developed, well nourished, NAD   Results: U/A: 0-5 WBC, 3-10 RBC "

## 2024-11-20 NOTE — Progress Notes (Signed)
 BCG Bladder Instillation  mBCG # 1 of 1  Due to Bladder Cancer patient is present today for a BCG treatment. Patient was cleaned and prepped in a sterile fashion with betadine. A 14FR catheter was inserted, urine return was noted 20ml, urine was yellow in color.  50ml of reconstituted BCG was instilled into the bladder. The catheter was then removed. Patient tolerated well, no complications were noted  Performed by: C. Fraya Ueda CMA & IVAR Pal CMA

## 2024-11-27 ENCOUNTER — Ambulatory Visit: Admitting: Urology

## 2024-11-27 DIAGNOSIS — C679 Malignant neoplasm of bladder, unspecified: Secondary | ICD-10-CM

## 2024-11-27 DIAGNOSIS — N1330 Unspecified hydronephrosis: Secondary | ICD-10-CM

## 2024-11-27 DIAGNOSIS — Z5111 Encounter for antineoplastic chemotherapy: Secondary | ICD-10-CM | POA: Diagnosis not present

## 2024-11-27 LAB — URINALYSIS, ROUTINE W REFLEX MICROSCOPIC
Bilirubin, UA: NEGATIVE
Glucose, UA: NEGATIVE
Ketones, UA: NEGATIVE
Leukocytes,UA: NEGATIVE
Nitrite, UA: NEGATIVE
Protein,UA: NEGATIVE
Specific Gravity, UA: 1.015 (ref 1.005–1.030)
Urobilinogen, Ur: 0.2 mg/dL (ref 0.2–1.0)
pH, UA: 5.5 (ref 5.0–7.5)

## 2024-11-27 LAB — MICROSCOPIC EXAMINATION

## 2024-11-27 MED ORDER — BCG LIVE 50 MG IS SUSR
3.2400 mL | Freq: Once | INTRAVESICAL | Status: AC
  Administered 2024-11-27: 81 mg via INTRAVESICAL

## 2024-11-27 NOTE — Progress Notes (Signed)
 BCG Bladder Instillation  mBCG # 2 of 3  Due to Bladder Cancer patient is present today for a BCG treatment. Patient was cleaned and prepped in a sterile fashion with betadine. A 14FR catheter was inserted, urine return was noted 15ml, urine was yellow in color.  50ml of reconstituted BCG was instilled into the bladder. The catheter was then removed. Patient tolerated well, no complications were noted  Performed by: JAYSON Mainland CMA & ONEIDA Pal CMA

## 2024-11-27 NOTE — Progress Notes (Signed)
" °  HPI: Dominic Ray has history of high-grade nonmuscle invasive bladder cancer.  He is here today for BCG No. 2/3 for maintenance immunotherapy  Objective: No issues noted since last treatment a week ago Urinalysis today looks normal  Assessment: High-grade nonmuscle invasive bladder cancer getting maintenance BCG  Plan: Patient administered BCG No. 2/3 today Return 1 week for last instillation "

## 2024-12-04 ENCOUNTER — Ambulatory Visit: Admitting: Urology

## 2024-12-04 ENCOUNTER — Encounter: Payer: Self-pay | Admitting: Urology

## 2024-12-04 DIAGNOSIS — Z5112 Encounter for antineoplastic immunotherapy: Secondary | ICD-10-CM | POA: Diagnosis not present

## 2024-12-04 DIAGNOSIS — C670 Malignant neoplasm of trigone of bladder: Secondary | ICD-10-CM

## 2024-12-04 DIAGNOSIS — N1330 Unspecified hydronephrosis: Secondary | ICD-10-CM | POA: Diagnosis not present

## 2024-12-04 LAB — URINALYSIS, ROUTINE W REFLEX MICROSCOPIC
Bilirubin, UA: NEGATIVE
Glucose, UA: NEGATIVE
Ketones, UA: NEGATIVE
Nitrite, UA: NEGATIVE
Protein,UA: NEGATIVE
Specific Gravity, UA: 1.015 (ref 1.005–1.030)
Urobilinogen, Ur: 0.2 mg/dL (ref 0.2–1.0)
pH, UA: 5.5 (ref 5.0–7.5)

## 2024-12-04 LAB — MICROSCOPIC EXAMINATION

## 2024-12-04 MED ORDER — BCG LIVE 50 MG IS SUSR
3.2400 mL | Freq: Once | INTRAVESICAL | Status: AC
  Administered 2024-12-04: 81 mg via INTRAVESICAL

## 2024-12-04 NOTE — Progress Notes (Signed)
 "  Assessment: 1. 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 Bilateral hydronephrosis   3. Encounter for antineoplastic immunotherapy     Plan: Maintenance BCG #3/3 given today Continue tamsulosin  and finasteride  for management of BPH. Return to office in 6 weeks for cystoscopy  Chief Complaint: Chief Complaint  Patient presents with   Bladder Cancer    HPI: Dominic Ray is a 69 y.o. male who presents for continued evaluation of  high risk nonmuscle invasive bladder cancer.  Patient also has BPH with large gland on combination medical therapy with tamsulosin  and finasteride .   See my note 06/14/2023 at time of initial visit for detailed history. Presented initially with worsening LUTS and gross hematuria.   PSA 06/2023=  3.1 Prostate volume (US )= 71ml   Heme eval-- Office cysto demonstrated bladder tumor CT heme protocol 06/20/2023-bladder wall thickening concerning for bladder cancer without evidence of metastatic disease or upper tract abnormalities.   Bladder cancer summary: Status post TURBT 07/18/2023-pathology high-grade T1-muscularis propria present but uninvolved--large area of tumor involvement trigone, right floor/lateral wall and around bladder neck. RUO not identified.  Left UO seen   Status post repeat TURBT 08/22/2023-pathology high-grade TA/question focal T1.  Muscularis propria present but uninvolved.  Comment was made of focal inverted growth pattern which could suggest potentially an aggressive phenotype. RUO again not visualized.  Left UO transected and wide open with clear efflux   Renal US  09/2023-- mild-mod bilateral hydro and marked bladder wall thickening Lab:  09/2023-- CR 0.94   CT-hematuria protocol 10/07/2023-mild to moderate bilateral hydro to the level of the bladder with contrast seen entering bladder.   Given the bilateral hydro and concern for worsening obstruction with potential inflammation with BCG  treatment the patient was taken back to the operating room on 10/25/2023. At that time I was able to resect both trigone areas and identify both ureteral openings which were subsequently stented.  Unfortunately and concerning there was evidence of significant regrowth of tumor in the prior interval involving the right trigone and bladder neck region.   Pathology demonstrates persistent T1 TCC-high-grade.   Patient completed induction BCG 01/2024. He was taken back to the operating room on 02/27/2024.  He underwent cystoscopy with bilateral ureteral stent exchange and transurethral resection of bladder tumor.  There was some shaggy urothelium on the trigone and bladder neck region. Pathology showed benign urothelium with polypoid cystitis and chronic inflammation without evidence of malignancy.  He completed his maintenance BCG on 05/01/24.  Urine culture from 04/17/24 showed no growth.  No problems following his last treatment. His stents were removed on 05/28/24. Surveillance cystoscopy in August 2025 did not show any evidence of recurrence in the bladder.  Urine cytology was negative for malignancy. He underwent evaluation with a renal ultrasound on 07/19/2024 for follow-up of the hydronephrosis.  This showed moderate right hydronephrosis with mild to moderate left hydronephrosis. CT hematuria study from 07/30/2024 showed mild irregular urinary bladder wall thickening and moderate to severe right hydronephrosis with mild left hydronephrosis. Lasix  renogram with Foley catheter in place from 9/26 showed split function of 50% bilaterally, T1 half of 14 minutes on the left and no T1 half on the right. Surveillance cystoscopy from 10/02/2024 showed several areas of increased vascularity at the bladder base and posterior bladder wall, chronic cystitis at bladder neck and trigone, no papillary lesions seen. Urine cytology negative for malignancy.  Renal ultrasound from 10/25/2024 showed moderate stable  hydronephrosis on  the right and stable mild hydronephrosis on the left.  He presents today for maintenance BCG #3/3.  Portions of the above documentation were copied from a prior visit for review purposes only.  Allergies: Allergies  Allergen Reactions   [name] [Lidocaine ] Rash    Anything ending in (-caine) per patient  Childhood allergy    PMH: Past Medical History:  Diagnosis Date   Basal cell carcinoma of nasal tip 08/24/2012   Bladder cancer (HCC) 2024   Follows w/ Dr. Layman Hurst, Urology.   Coronary artery disease    a. Abnl stress test -> s/p CABGx4 in 04/2017 with LIMA-LAD, SVG-diag, SVG-AM, SVG-PDA.   Heart murmur    Hyperlipidemia    Follows w/ Dr. Pietro, cardiology.   Male hypogonadism 08/24/2012   Onychomycosis 08/24/2012   Pneumonia 1967    PSH: Past Surgical History:  Procedure Laterality Date   CARDIOVASCULAR STRESS TEST  04/2017   COLONOSCOPY  12/06/2022   one descending colon polyp   CORONARY ARTERY BYPASS GRAFT N/A 05/09/2017   Procedure: CORONARY ARTERY BYPASS GRAFTING times four using left internal mammary artery and right leg saphenous vein  ;  Surgeon: Lucas Dorise POUR, MD;  Location: [facility] OR;  Service: Open Heart Surgery;  Laterality: N/A;   CYSTOSCOPY WITH RETROGRADE PYELOGRAM, URETEROSCOPY AND STENT PLACEMENT Bilateral 10/25/2023   Procedure: CYSTOSCOPY WITH RETROGRADE PYELOGRAM, URETEROSCOPY AND STENT PLACEMENT;  Surgeon: Hurst Layman BROCKS, MD;  Location[facility];  Service: Urology;  Laterality: Bilateral;   CYSTOSCOPY WITH STENT PLACEMENT Bilateral 02/27/2024   Procedure: CYSTOSCOPY, WITH STENT INSERTION;  Surgeon: Hurst Layman BROCKS, MD;  Location: [facility];  Service: Urology;  Laterality: Bilateral;   LEFT HEART CATH AND CORONARY ANGIOGRAPHY N/A 05/08/2017   Procedure: Left Heart Cath and Coronary Angiography;  Surgeon: Burnard Debby LABOR, MD;  Location: [facility];  Service: Cardiovascular;  Laterality: N/A;   TEE WITHOUT  CARDIOVERSION N/A 05/09/2017   Procedure: TRANSESOPHAGEAL ECHOCARDIOGRAM (TEE);  Surgeon: Lucas Dorise POUR, MD;  Location: [facility] OR;  Service: Open Heart Surgery;  Laterality: N/A;   TONSILLECTOMY  1968   TRANSURETHRAL RESECTION OF BLADDER TUMOR Bilateral 07/18/2023   Procedure: TRANSURETHRAL RESECTION OF BLADDER TUMOR (TURBT);  Surgeon: Hurst Layman BROCKS, MD;  Location: [facility];  Service: Urology;  Laterality: Bilateral;   TRANSURETHRAL RESECTION OF BLADDER TUMOR Bilateral 08/22/2023   Procedure: TRANSURETHRAL RESECTION OF BLADDER TUMOR (TURBT);  Surgeon: Hurst Layman BROCKS, MD;  Location: [facility];  Service: Urology;  Laterality: Bilateral;   TRANSURETHRAL RESECTION OF BLADDER TUMOR N/A 10/25/2023   Procedure: TRANSURETHRAL RESECTION OF BLADDER TUMOR (TURBT);  Surgeon: Hurst Layman BROCKS, MD;  Location: [facility];  Service: Urology;  Laterality: N/A;   TRANSURETHRAL RESECTION OF BLADDER TUMOR N/A 02/27/2024   Procedure: TURBT (TRANSURETHRAL RESECTION OF BLADDER TUMOR);  Surgeon: Hurst Layman BROCKS, MD;  Location: [facility];  Service: Urology;  Laterality: N/A;    SH: Social History   Tobacco Use   Smoking status: Never   Smokeless tobacco: Never  Vaping Use   Vaping status: Never Used  Substance Use Topics   Alcohol use: Yes    Comment: Occasional   Drug use: No    ROS: Constitutional:  Negative for fever, chills, weight loss CV: Negative for chest pain, previous MI, hypertension Respiratory:  Negative for shortness of breath, wheezing, sleep apnea, frequent cough GI:  Negative for nausea, vomiting, bloody stool, GERD  PE: GENERAL APPEARANCE:  Well appearing, well developed, well nourished,  NAD   Results: U/A: 11-30 WBC, 3-10 RBC "

## 2024-12-04 NOTE — Progress Notes (Signed)
 BCG Bladder Instillation  mBCG # 3 OF 3  Due to Bladder Cancer patient is present today for a BCG treatment. Patient was cleaned and prepped in a sterile fashion with betadine. A 14FR catheter was inserted, urine return was noted 50mL, urine was yellow in color.  50mL of reconstituted BCG was instilled into the bladder. The catheter was then removed. Patient tolerated well, no complications were noted  Performed by: C. Joselin Crandell CMA & IVAR Pal CMA

## 2025-01-15 ENCOUNTER — Other Ambulatory Visit: Admitting: Urology

## 2025-03-10 ENCOUNTER — Encounter: Admitting: Sports Medicine

## 2025-03-10 ENCOUNTER — Encounter: Admitting: Medical-Surgical
